# Patient Record
Sex: Female | Born: 1965 | Race: White | Hispanic: No | Smoking: Former smoker
Health system: Southern US, Community
[De-identification: ages and names within clinical notes are randomized; demographics above are authoritative.]

## PROBLEM LIST (undated history)

## (undated) DIAGNOSIS — J302 Other seasonal allergic rhinitis: Secondary | ICD-10-CM

## (undated) DIAGNOSIS — K219 Gastro-esophageal reflux disease without esophagitis: Secondary | ICD-10-CM

## (undated) DIAGNOSIS — G4730 Sleep apnea, unspecified: Secondary | ICD-10-CM

## (undated) DIAGNOSIS — F32A Depression, unspecified: Secondary | ICD-10-CM

## (undated) DIAGNOSIS — I4892 Unspecified atrial flutter: Secondary | ICD-10-CM

## (undated) DIAGNOSIS — T7840XA Allergy, unspecified, initial encounter: Secondary | ICD-10-CM

## (undated) DIAGNOSIS — I4891 Unspecified atrial fibrillation: Secondary | ICD-10-CM

## (undated) DIAGNOSIS — C189 Malignant neoplasm of colon, unspecified: Secondary | ICD-10-CM

## (undated) DIAGNOSIS — D500 Iron deficiency anemia secondary to blood loss (chronic): Secondary | ICD-10-CM

## (undated) DIAGNOSIS — F419 Anxiety disorder, unspecified: Secondary | ICD-10-CM

## (undated) DIAGNOSIS — Z86010 Personal history of colonic polyps: Secondary | ICD-10-CM

## (undated) HISTORY — DX: Sleep apnea, unspecified: G47.30

## (undated) HISTORY — DX: Depression, unspecified: F32.A

## (undated) HISTORY — DX: Anxiety disorder, unspecified: F41.9

## (undated) HISTORY — DX: Unspecified atrial fibrillation: I48.91

## (undated) HISTORY — DX: Unspecified atrial flutter: I48.92

## (undated) HISTORY — DX: Gastro-esophageal reflux disease without esophagitis: K21.9

## (undated) HISTORY — PX: WISDOM TOOTH EXTRACTION: SHX21

## (undated) HISTORY — DX: Iron deficiency anemia secondary to blood loss (chronic): D50.0

## (undated) HISTORY — DX: Personal history of colonic polyps: Z86.010

## (undated) HISTORY — DX: Malignant neoplasm of colon, unspecified: C18.9

## (undated) HISTORY — DX: Allergy, unspecified, initial encounter: T78.40XA

---

## 2002-06-24 ENCOUNTER — Other Ambulatory Visit: Admission: RE | Admit: 2002-06-24 | Discharge: 2002-06-24 | Payer: Self-pay | Admitting: Obstetrics and Gynecology

## 2003-06-28 ENCOUNTER — Other Ambulatory Visit: Admission: RE | Admit: 2003-06-28 | Discharge: 2003-06-28 | Payer: Self-pay | Admitting: Gynecology

## 2004-06-28 ENCOUNTER — Other Ambulatory Visit: Admission: RE | Admit: 2004-06-28 | Discharge: 2004-06-28 | Payer: Self-pay | Admitting: Gynecology

## 2004-10-23 ENCOUNTER — Other Ambulatory Visit: Admission: RE | Admit: 2004-10-23 | Discharge: 2004-10-23 | Payer: Self-pay | Admitting: Gynecology

## 2005-06-29 ENCOUNTER — Other Ambulatory Visit: Admission: RE | Admit: 2005-06-29 | Discharge: 2005-06-29 | Payer: Self-pay | Admitting: Gynecology

## 2005-10-04 ENCOUNTER — Other Ambulatory Visit: Admission: RE | Admit: 2005-10-04 | Discharge: 2005-10-04 | Payer: Self-pay | Admitting: Obstetrics and Gynecology

## 2006-07-22 ENCOUNTER — Other Ambulatory Visit: Admission: RE | Admit: 2006-07-22 | Discharge: 2006-07-22 | Payer: Self-pay | Admitting: Gynecology

## 2007-08-01 ENCOUNTER — Other Ambulatory Visit: Admission: RE | Admit: 2007-08-01 | Discharge: 2007-08-01 | Payer: Self-pay | Admitting: Gynecology

## 2008-02-12 ENCOUNTER — Other Ambulatory Visit: Admission: RE | Admit: 2008-02-12 | Discharge: 2008-02-12 | Payer: Self-pay | Admitting: Gynecology

## 2008-02-13 ENCOUNTER — Ambulatory Visit (HOSPITAL_BASED_OUTPATIENT_CLINIC_OR_DEPARTMENT_OTHER): Admission: RE | Admit: 2008-02-13 | Discharge: 2008-02-13 | Payer: Self-pay | Admitting: Otolaryngology

## 2008-09-15 ENCOUNTER — Other Ambulatory Visit: Admission: RE | Admit: 2008-09-15 | Discharge: 2008-09-15 | Payer: Self-pay | Admitting: Gynecology

## 2008-09-15 ENCOUNTER — Ambulatory Visit: Payer: Self-pay | Admitting: Women's Health

## 2008-09-15 ENCOUNTER — Encounter: Payer: Self-pay | Admitting: Women's Health

## 2009-04-11 ENCOUNTER — Ambulatory Visit: Payer: Self-pay | Admitting: Women's Health

## 2009-04-11 ENCOUNTER — Other Ambulatory Visit: Admission: RE | Admit: 2009-04-11 | Discharge: 2009-04-11 | Payer: Self-pay | Admitting: Gynecology

## 2009-04-11 ENCOUNTER — Encounter: Payer: Self-pay | Admitting: Women's Health

## 2009-05-17 ENCOUNTER — Ambulatory Visit: Payer: Self-pay | Admitting: Gynecology

## 2009-05-30 ENCOUNTER — Ambulatory Visit: Payer: Self-pay | Admitting: Gynecology

## 2010-04-20 ENCOUNTER — Ambulatory Visit: Payer: Self-pay | Admitting: Women's Health

## 2010-04-20 ENCOUNTER — Other Ambulatory Visit: Admission: RE | Admit: 2010-04-20 | Discharge: 2010-04-20 | Payer: Self-pay | Admitting: Gynecology

## 2011-04-25 ENCOUNTER — Encounter: Payer: Self-pay | Admitting: Women's Health

## 2011-04-26 ENCOUNTER — Encounter (INDEPENDENT_AMBULATORY_CARE_PROVIDER_SITE_OTHER): Payer: BC Managed Care – PPO | Admitting: Women's Health

## 2011-04-26 ENCOUNTER — Other Ambulatory Visit: Payer: Self-pay | Admitting: Women's Health

## 2011-04-26 ENCOUNTER — Other Ambulatory Visit (HOSPITAL_COMMUNITY)
Admission: RE | Admit: 2011-04-26 | Discharge: 2011-04-26 | Disposition: A | Discharge status: Home / Self Care | Payer: BC Managed Care – PPO | Source: Ambulatory Visit | Attending: Gynecology | Admitting: Gynecology

## 2011-04-26 DIAGNOSIS — Z01419 Encounter for gynecological examination (general) (routine) without abnormal findings: Secondary | ICD-10-CM

## 2011-04-26 DIAGNOSIS — Z124 Encounter for screening for malignant neoplasm of cervix: Secondary | ICD-10-CM | POA: Insufficient documentation

## 2011-05-01 NOTE — Procedures (Signed)
NAME:  JACQUELYNN, FRIEND        ACCOUNT NO.:  [id number]   MEDICAL RECORD NO.:  [id number]          PATIENT TYPE:  OUT   LOCATION:  SLEEP CENTER                 FACILITY:  [facility]   PHYSICIAN:  Clinton D. Maple Hudson, MD, FCCP, FACPDATE OF BIRTH:  10-26-1964   DATE OF STUDY:  02/13/2008                            NOCTURNAL POLYSOMNOGRAM   REFERRING PHYSICIAN:  Hermelinda Medicus, M.D.   INDICATION FOR STUDY:  Hypersomnia with sleep apnea.   EPWORTH SLEEPINESS SCORE:  10/24, BMI 34.4, weight 188 pounds, height 62  inches, neck 14.5 inches.   HOME MEDICATIONS:  None listed.   SLEEP ARCHITECTURE:  Total sleep time 373 minutes with sleep efficiency  94%.  Stage I was 2%, stage II was 83.9%, stage III absent, REM 14.1% of  total sleep time.  Sleep latency 9 minutes, REM latency 87 minutes.  Awake after sleep onset 15 minutes.  Arousal index 2.9.  No bedtime  medication was taken.   RESPIRATORY DATA:  NPSG protocol.  Apnea/hypopnea index (AHI) 3.1 per  hour which is within normal limits.  Nineteen events were recorded, all  hypopneas.  Events were not positional.   OXYGEN DATA:  Loud snoring with oxygen desaturation to a nadir of 90%,  mean oxygen saturation through the study was 95.5% on room air.   CARDIAC DATA:  Normal sinus rhythm.   MOVEMENT-PARASOMNIA:  No significant movement disturbance, no bathroom  trips.   IMPRESSIONS-RECOMMENDATIONS:  Occasional respiratory events within  normal limits.  Apnea/hypopnea index 3.1 per hour (normal range 0-5 per  hour).  Events were not positional.  Loud snoring with oxygen  desaturation to a nadir of 90%.      Clinton D. Maple Hudson, MD, [facility], FACP  Diplomate, Biomedical engineer of Sleep Medicine  Electronically Signed     CDY/MEDQ  D:  02/21/2008 15:45:54  T:  02/22/2008 12:11:05  Job:  [id number]

## 2012-04-16 HISTORY — PX: WRIST SURGERY: SHX841

## 2012-05-01 ENCOUNTER — Ambulatory Visit (INDEPENDENT_AMBULATORY_CARE_PROVIDER_SITE_OTHER): Payer: BC Managed Care – PPO | Admitting: Women's Health

## 2012-05-01 ENCOUNTER — Encounter: Payer: BC Managed Care – PPO | Admitting: Women's Health

## 2012-05-01 ENCOUNTER — Other Ambulatory Visit (HOSPITAL_COMMUNITY)
Admission: RE | Admit: 2012-05-01 | Discharge: 2012-05-01 | Disposition: A | Discharge status: Home / Self Care | Payer: BC Managed Care – PPO | Source: Ambulatory Visit | Attending: Obstetrics and Gynecology | Admitting: Obstetrics and Gynecology

## 2012-05-01 ENCOUNTER — Encounter: Payer: Self-pay | Admitting: Women's Health

## 2012-05-01 VITALS — BP 120/80 | Ht 62.5 in | Wt 198.0 lb

## 2012-05-01 DIAGNOSIS — Z13220 Encounter for screening for lipoid disorders: Secondary | ICD-10-CM

## 2012-05-01 DIAGNOSIS — Z01419 Encounter for gynecological examination (general) (routine) without abnormal findings: Secondary | ICD-10-CM

## 2012-05-01 DIAGNOSIS — Z833 Family history of diabetes mellitus: Secondary | ICD-10-CM

## 2012-05-01 DIAGNOSIS — E079 Disorder of thyroid, unspecified: Secondary | ICD-10-CM

## 2012-05-01 MED ORDER — FLUCONAZOLE 100 MG PO TABS: ORAL_TABLET | ORAL | Status: DC

## 2012-05-01 NOTE — Patient Instructions (Signed)

## 2012-05-01 NOTE — Progress Notes (Signed)
Savannah Cook [birth year] [id number]    History:    The patient presents for annual exam.  Monthly 4 day light cycle. Uses no contraception. History of ascus in 2005, 2008 with negative high-risk HPV,  with normal Paps after. History of normal mammograms, last mammogram 2011.   Past medical history, past surgical history, family history and social history were all reviewed and documented in the EPIC chart. 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 Daughter [name] 16, doing well, gardasil encouraged. Mother with many health issues and mental health problems living with her.   ROS:  A  ROS was performed and pertinent positives and negatives are included in the history.  Exam:  Filed Vitals:   05/01/12 0906  BP: 120/80    General appearance:  Normal Head/Neck:  Normal, without cervical or supraclavicular adenopathy. Thyroid:  Symmetrical, normal in size, without palpable masses or nodularity. Respiratory  Effort:  Normal  Auscultation:  Clear without wheezing or rhonchi Cardiovascular  Auscultation:  Regular rate, without rubs, murmurs or gallops  Edema/varicosities:  Not grossly evident Abdominal  Soft,nontender, without masses, guarding or rebound.  Liver/spleen:  No organomegaly noted  Hernia:  None appreciated  Skin  Inspection:  Grossly normal  Palpation:  Grossly normal Neurologic/psychiatric  Orientation:  Normal with appropriate conversation.  Mood/affect:  Normal  Genitourinary    Breasts: Examined lying and sitting.     Right: Without masses, retractions, discharge or axillary adenopathy.     Left: Without masses, retractions, discharge or axillary adenopathy.   Inguinal/mons:  Normal without inguinal adenopathy  External genitalia:  Normal  BUS/Urethra/Skene's glands:  Normal  Bladder:  Normal  Vagina:  Normal  Cervix:  Normal  Uterus:   normal in size, fibroids,  Midline and mobile  Adnexa/parametria:     Rt: Without masses or tenderness.   Lt: Without masses or  tenderness.  Anus and perineum: Normal  Digital rectal exam: Normal sphincter tone without palpated masses or tenderness  Assessment/Plan:  46 y.o. DWF G1P1  for annual exam with occasional knee pain and increased fatigue, stress/anxiety  Ascus with negative high-risk HPV in 2006, 2008 normal Paps after Light four-day monthly cycles Multiple small uterine fibroids. Situational stressors  Plan: SBE's, schedule annual mammogram, breast mammogram 2011. Situational stressors of mother with multiple health issues living with her, encourage counseling, setting limits, community home health assistants as needed. Declines need for medications. Condoms encouraged when sexually active, menopause reviewed,  CBC, glucose, TSH, lipid profile, UA and Pap.    Harrington Challenger [facility], 12:51 PM 05/01/2012

## 2012-05-02 LAB — URINALYSIS W MICROSCOPIC + REFLEX CULTURE
Bacteria, UA: NONE SEEN
Bilirubin Urine: NEGATIVE
Glucose, UA: NEGATIVE mg/dL
Ketones, ur: NEGATIVE mg/dL
Protein, ur: NEGATIVE mg/dL
Squamous Epithelial / LPF: NONE SEEN
Urobilinogen, UA: 0.2 mg/dL (ref 0.0–1.0)

## 2012-05-05 ENCOUNTER — Other Ambulatory Visit: Payer: Self-pay | Admitting: Orthopedic Surgery

## 2012-05-05 ENCOUNTER — Telehealth: Payer: Self-pay | Admitting: *Deleted

## 2012-05-05 NOTE — Telephone Encounter (Signed)
[phone] 

## 2012-05-05 NOTE — Telephone Encounter (Signed)
Pt would like you to call you at your convenience at [phone] she would like to update you information.

## 2012-05-05 NOTE — Telephone Encounter (Signed)
Cell number not available.

## 2012-05-06 ENCOUNTER — Encounter (HOSPITAL_BASED_OUTPATIENT_CLINIC_OR_DEPARTMENT_OTHER): Payer: Self-pay | Admitting: *Deleted

## 2012-05-06 NOTE — Progress Notes (Signed)
No labs needed To come with friend Lives with mom

## 2012-05-06 NOTE — Telephone Encounter (Signed)
Telephone call. States was on a camping  trip with her daughter and friend, fell off a log broke right radius and is having surgery on 5/23. Questions when she should have her labs drawn. Fainted at office visit with blood draw. Will have lipid panel, TSH, glucose, drawn while recovering from surgery, will be out of work 2 weeks. Reviewed she will have a CBC prior to her surgery. Instructed to call office for a lab appointment.

## 2012-05-07 ENCOUNTER — Encounter: Payer: BC Managed Care – PPO | Admitting: Women's Health

## 2012-05-08 ENCOUNTER — Encounter (HOSPITAL_BASED_OUTPATIENT_CLINIC_OR_DEPARTMENT_OTHER)
Admission: RE | Disposition: A | Discharge status: Home / Self Care | Payer: Self-pay | Source: Ambulatory Visit | Attending: Orthopedic Surgery

## 2012-05-08 ENCOUNTER — Ambulatory Visit (HOSPITAL_BASED_OUTPATIENT_CLINIC_OR_DEPARTMENT_OTHER)
Admission: RE | Admit: 2012-05-08 | Discharge: 2012-05-08 | Disposition: A | Discharge status: Home / Self Care | Payer: BC Managed Care – PPO | Source: Ambulatory Visit | Attending: Orthopedic Surgery | Admitting: Orthopedic Surgery

## 2012-05-08 ENCOUNTER — Encounter (HOSPITAL_BASED_OUTPATIENT_CLINIC_OR_DEPARTMENT_OTHER): Payer: Self-pay | Admitting: Anesthesiology

## 2012-05-08 ENCOUNTER — Encounter (HOSPITAL_BASED_OUTPATIENT_CLINIC_OR_DEPARTMENT_OTHER): Payer: Self-pay | Admitting: *Deleted

## 2012-05-08 ENCOUNTER — Ambulatory Visit (HOSPITAL_BASED_OUTPATIENT_CLINIC_OR_DEPARTMENT_OTHER): Payer: BC Managed Care – PPO | Admitting: Anesthesiology

## 2012-05-08 DIAGNOSIS — W010XXA Fall on same level from slipping, tripping and stumbling without subsequent striking against object, initial encounter: Secondary | ICD-10-CM | POA: Insufficient documentation

## 2012-05-08 DIAGNOSIS — Y92838 Other recreation area as the place of occurrence of the external cause: Secondary | ICD-10-CM | POA: Insufficient documentation

## 2012-05-08 DIAGNOSIS — Y9239 Other specified sports and athletic area as the place of occurrence of the external cause: Secondary | ICD-10-CM | POA: Insufficient documentation

## 2012-05-08 DIAGNOSIS — S52599A Other fractures of lower end of unspecified radius, initial encounter for closed fracture: Secondary | ICD-10-CM | POA: Insufficient documentation

## 2012-05-08 HISTORY — DX: Other seasonal allergic rhinitis: J30.2

## 2012-05-08 SURGERY — OPEN REDUCTION INTERNAL FIXATION (ORIF) DISTAL RADIUS FRACTURE
Anesthesia: General | Site: Wrist | Laterality: Right | Wound class: Clean

## 2012-05-08 MED ORDER — FENTANYL CITRATE 0.05 MG/ML IJ SOLN
50.0000 ug | INTRAMUSCULAR | Status: DC | PRN
  Administered 2012-05-08: 100 ug via INTRAVENOUS

## 2012-05-08 MED ORDER — FENTANYL CITRATE 0.05 MG/ML IJ SOLN: 25.0000 ug | INTRAMUSCULAR | Status: DC | PRN

## 2012-05-08 MED ORDER — DOXYCYCLINE HYCLATE 100 MG PO TABS: 100.0000 mg | ORAL_TABLET | Freq: Two times a day (BID) | ORAL | Status: AC

## 2012-05-08 MED ORDER — EPHEDRINE SULFATE 50 MG/ML IJ SOLN
INTRAMUSCULAR | Status: DC | PRN
  Administered 2012-05-08 (×2): 10 mg via INTRAVENOUS

## 2012-05-08 MED ORDER — LIDOCAINE HCL (CARDIAC) 20 MG/ML IV SOLN
INTRAVENOUS | Status: DC | PRN
  Administered 2012-05-08: 50 mg via INTRAVENOUS

## 2012-05-08 MED ORDER — ROPIVACAINE HCL 5 MG/ML IJ SOLN
INTRAMUSCULAR | Status: DC | PRN
  Administered 2012-05-08: 25 mL via EPIDURAL

## 2012-05-08 MED ORDER — MIDAZOLAM HCL 2 MG/2ML IJ SOLN
0.5000 mg | INTRAMUSCULAR | Status: DC | PRN
  Administered 2012-05-08: 2 mg via INTRAVENOUS

## 2012-05-08 MED ORDER — PROPOFOL 10 MG/ML IV EMUL
INTRAVENOUS | Status: DC | PRN
  Administered 2012-05-08: 200 mg via INTRAVENOUS

## 2012-05-08 MED ORDER — DEXAMETHASONE SODIUM PHOSPHATE 4 MG/ML IJ SOLN
INTRAMUSCULAR | Status: DC | PRN
  Administered 2012-05-08: 10 mg via INTRAVENOUS

## 2012-05-08 MED ORDER — ONDANSETRON HCL 4 MG/2ML IJ SOLN
INTRAMUSCULAR | Status: DC | PRN
  Administered 2012-05-08: 4 mg via INTRAVENOUS

## 2012-05-08 MED ORDER — MIDAZOLAM HCL 5 MG/5ML IJ SOLN
INTRAMUSCULAR | Status: DC | PRN
  Administered 2012-05-08: 1 mg via INTRAVENOUS

## 2012-05-08 MED ORDER — VANCOMYCIN HCL IN DEXTROSE 1-5 GM/200ML-% IV SOLN
1000.0000 mg | INTRAVENOUS | Status: AC
  Administered 2012-05-08: 1000 mg via INTRAVENOUS

## 2012-05-08 MED ORDER — METOCLOPRAMIDE HCL 5 MG/ML IJ SOLN: 10.0000 mg | Freq: Once | INTRAMUSCULAR | Status: DC | PRN

## 2012-05-08 MED ORDER — LACTATED RINGERS IV SOLN
INTRAVENOUS | Status: DC
  Administered 2012-05-08 (×2): via INTRAVENOUS

## 2012-05-08 MED ORDER — LIDOCAINE HCL 1 % IJ SOLN
INTRAMUSCULAR | Status: DC | PRN
  Administered 2012-05-08: 2 mL via INTRADERMAL

## 2012-05-08 MED ORDER — CHLORHEXIDINE GLUCONATE 4 % EX LIQD: 60.0000 mL | Freq: Once | CUTANEOUS | Status: DC

## 2012-05-08 SURGICAL SUPPLY — 67 items
BAG DECANTER FOR FLEXI CONT (MISCELLANEOUS) IMPLANT
BANDAGE ADHESIVE 1X3 (GAUZE/BANDAGES/DRESSINGS) IMPLANT
BANDAGE ELASTIC 3 VELCRO ST LF (GAUZE/BANDAGES/DRESSINGS) ×4 IMPLANT
BANDAGE GAUZE ELAST BULKY 4 IN (GAUZE/BANDAGES/DRESSINGS) ×2 IMPLANT
BIT DRILL 2 FAST STEP (BIT) ×2 IMPLANT
BIT DRILL 2.5X4 QC (BIT) ×2 IMPLANT
BLADE MINI RND TIP GREEN BEAV (BLADE) ×2 IMPLANT
BLADE SURG 15 STRL LF DISP TIS (BLADE) ×2 IMPLANT
BLADE SURG 15 STRL SS (BLADE) ×2
BNDG ESMARK 4X9 LF (GAUZE/BANDAGES/DRESSINGS) ×2 IMPLANT
BRUSH SCRUB EZ PLAIN DRY (MISCELLANEOUS) ×2 IMPLANT
CANISTER SUCTION 1200CC (MISCELLANEOUS) ×2 IMPLANT
CLOTH BEACON ORANGE TIMEOUT ST (SAFETY) ×2 IMPLANT
CORDS BIPOLAR (ELECTRODE) ×2 IMPLANT
COVER MAYO STAND STRL (DRAPES) ×2 IMPLANT
COVER TABLE BACK 60X90 (DRAPES) ×2 IMPLANT
CUFF TOURNIQUET SINGLE 18IN (TOURNIQUET CUFF) ×2 IMPLANT
DECANTER SPIKE VIAL GLASS SM (MISCELLANEOUS) IMPLANT
DRAPE EXTREMITY T 121X128X90 (DRAPE) ×2 IMPLANT
DRAPE OEC MINIVIEW 54X84 (DRAPES) ×2 IMPLANT
DRAPE SURG 17X23 STRL (DRAPES) ×2 IMPLANT
GAUZE XEROFORM 1X8 LF (GAUZE/BANDAGES/DRESSINGS) IMPLANT
GLOVE BIO SURGEON STRL SZ 6.5 (GLOVE) ×4 IMPLANT
GLOVE BIOGEL M STRL SZ7.5 (GLOVE) ×4 IMPLANT
GLOVE BIOGEL PI IND STRL 7.0 (GLOVE) ×1 IMPLANT
GLOVE BIOGEL PI INDICATOR 7.0 (GLOVE) ×1
GLOVE ORTHO TXT STRL SZ7.5 (GLOVE) ×2 IMPLANT
GOWN PREVENTION PLUS XLARGE (GOWN DISPOSABLE) IMPLANT
GOWN PREVENTION PLUS XXLARGE (GOWN DISPOSABLE) IMPLANT
KWIRE 4.0 X .062IN (WIRE) ×2 IMPLANT
LOOP VESSEL MAXI BLUE (MISCELLANEOUS) IMPLANT
NEEDLE 27GAX1X1/2 (NEEDLE) IMPLANT
NS IRRIG 1000ML POUR BTL (IV SOLUTION) ×2 IMPLANT
PACK BASIN DAY SURGERY FS (CUSTOM PROCEDURE TRAY) ×2 IMPLANT
PAD CAST 3X4 CTTN HI CHSV (CAST SUPPLIES) ×2 IMPLANT
PADDING CAST ABS 4INX4YD NS (CAST SUPPLIES) ×1
PADDING CAST ABS COTTON 4X4 ST (CAST SUPPLIES) ×1 IMPLANT
PADDING CAST COTTON 3X4 STRL (CAST SUPPLIES) ×2
PEG SUBCHONDRAL SMOOTH 2.0X16 (Peg) ×4 IMPLANT
PEG SUBCHONDRAL SMOOTH 2.0X18 (Peg) ×8 IMPLANT
PLATE SHORT 24.4X51.3 RT (Plate) ×2 IMPLANT
SCREW BN 12X3.5XNS CORT TI (Screw) ×2 IMPLANT
SCREW CORT 3.5X10 LNG (Screw) ×2 IMPLANT
SCREW CORT 3.5X12 (Screw) ×2 IMPLANT
SCREW PEG LOCK 2.5X16 (Peg) ×2 IMPLANT
SLEEVE SCD COMPRESS KNEE MED (MISCELLANEOUS) IMPLANT
SPLINT PLASTER CAST XFAST 3X15 (CAST SUPPLIES) ×10 IMPLANT
SPLINT PLASTER XTRA FASTSET 3X (CAST SUPPLIES) ×10
SPONGE GAUZE 4X4 12PLY (GAUZE/BANDAGES/DRESSINGS) IMPLANT
STOCKINETTE 4X48 STRL (DRAPES) ×2 IMPLANT
STRIP CLOSURE SKIN 1/2X4 (GAUZE/BANDAGES/DRESSINGS) ×2 IMPLANT
SUCTION FRAZIER TIP 10 FR DISP (SUCTIONS) IMPLANT
SUT ETHIBOND 3-0 V-5 (SUTURE) IMPLANT
SUT PROLENE 3 0 PS 2 (SUTURE) ×2 IMPLANT
SUT VIC AB 0 SH 27 (SUTURE) ×2 IMPLANT
SUT VIC AB 2-0 PS2 27 (SUTURE) ×2 IMPLANT
SUT VIC AB 3-0 FS2 27 (SUTURE) ×2 IMPLANT
SUT VIC AB 4-0 BRD 54 (SUTURE) IMPLANT
SUT VICRYL 4-0 PS2 18IN ABS (SUTURE) ×2 IMPLANT
SYR 3ML 23GX1 SAFETY (SYRINGE) IMPLANT
SYR BULB 3OZ (MISCELLANEOUS) ×2 IMPLANT
SYR CONTROL 10ML LL (SYRINGE) IMPLANT
TOWEL OR 17X24 6PK STRL BLUE (TOWEL DISPOSABLE) ×2 IMPLANT
TRAY DSU PREP LF (CUSTOM PROCEDURE TRAY) ×2 IMPLANT
TUBE CONNECTING 20X1/4 (TUBING) ×2 IMPLANT
UNDERPAD 30X30 INCONTINENT (UNDERPADS AND DIAPERS) ×2 IMPLANT
WATER STERILE IRR 1000ML POUR (IV SOLUTION) IMPLANT

## 2012-05-08 NOTE — Op Note (Signed)
Note dictated [id number]

## 2012-05-08 NOTE — H&P (Addendum)
Grossly normalGrossly normalGrossly normalDiane LORENZA Cook is an 46 y.o. female.   Chief Complaint: c/o injury to her right wrist after a fall. HPI: Savannah Cook is referred by the [facility] in [place], [place] for follow up of a comminuted intraarticular fracture of the right distal radius sustained when she fell at [place] on 05-02-12.   At that time she was camping with her children. She accidentally tripped over a log falling near a fire pit. She sustained a significant injury to her right wrist with marked swelling, pain and deformity. She was seen at the [facility] in [place] where digital images of her wrist revealed a comminuted intraarticular fracture of the right distal radius. She was evaluated by Dr. Kae Heller, ER physician who under Propofol anesthesia performed a closed reduction of her wrist and applied a sugar tong splint.   X-rays in the splint revealed near anatomic reduction, however careful inspection of the films and specifically lateral film reveals that her volar cortex is not locked and the dorsal cortex is comminuted. There is a 100% certainty her wrist fracture will displace.   Past Medical History  Diagnosis Date  . Anxiety   . Seasonal allergies     No past surgical history on file.  Family History  Problem Relation Age of Onset  . Diabetes Mother   . Hypertension Mother   . Bipolar disorder Mother   . Diabetes Father   . Hypertension Father   . Heart disease Father   . Cancer Father     LUNG  . Cancer Maternal Grandfather     COLON   Social History:  reports that she has never smoked. She has never used smokeless tobacco. She reports that she drinks alcohol. She reports that she does not use illicit drugs.  Allergies:  Allergies  Allergen Reactions  . Ceclor (Cefaclor)   . Other     SEASONAL    No prescriptions prior to admission    No results found for this or any previous visit (from the past 48 hour(s)).  No results  found.   Pertinent items are noted in HPI.  Last menstrual period 05/05/2012.  General appearance: alert Head: Normocephalic, without obvious abnormality Neck: supple, symmetrical, trachea midline Resp: clear to auscultation bilaterally Cardio: regular rate and rhythm GI: normal findings: bowel sounds normal Extremities: . She has 3-4+ swelling of her fingers in the sugar tong splint. Her sensibility is mildly impaired due to swelling. She is able to flex her thumb and fingers. Her shoulder is uninjured. She has mild discomfort in her left wrist but states she did not sustain a significant injury to the left hand.   The films from [facility] in [place] are reviewed. She has a comminuted intraarticular fracture through the lunate facet and through the distal radial ulnar joint.   Her post reduction films revealed near anatomic reduction on the PA view, however on the lateral her volar cortex is not locked and the dorsal cortex is comminuted. We know from experience the chance of displacement is very substantial with loss of length, slope and ventral tilt.  Pulses:2+ Skin:normal Neurologic: WNL    Assessment/Plan Assessment: Comminuted intraarticular fracture right wrist.  Plan: I have advised Lihanna to be placed in a new sugar tong splint that is much less tight. This will help her swelling. She will work on ROM exercises. We will schedule her for elective ORIF of her fracture with application of volar plate on 1-61-09 at [facility]. The surgery, after care, risks and benefits were described in detail.     DASNOIT,Pharrell Ledford J 05/08/2012, 10:31 AM  Wyn Forster., MD     History and physical updated.  No change in plan. Savannah Cook is much more comfortable in her revised splint.  Surgery and aftercare described in detail.

## 2012-05-08 NOTE — Transfer of Care (Signed)
Immediate Anesthesia Transfer of Care Note  Patient: Savannah Cook  Procedure(s) Performed: Procedure(s) (LRB): OPEN REDUCTION INTERNAL FIXATION (ORIF) DISTAL RADIAL FRACTURE (Right)  Patient Location: PACU  Anesthesia Type: GA combined with regional for post-op pain  Level of Consciousness: awake, alert  and oriented  Airway & Oxygen Therapy: Patient Spontanous Breathing and Patient connected to face mask oxygen  Post-op Assessment: Report given to PACU RN and Post -op Vital signs reviewed and stable  Post vital signs: Reviewed and stable  Complications: No apparent anesthesia complications

## 2012-05-08 NOTE — Brief Op Note (Signed)
05/08/2012  3:52 PM  PATIENT:  Savannah Cook  46 y.o. female  PRE-OPERATIVE DIAGNOSIS:  displaced right distal radius fracture comminuted articular  POST-OPERATIVE DIAGNOSIS:  displaced articular fracture of right distal radius  PROCEDURE: OPEN REDUCTION INTERNAL FIXATION (ORIF) DISTAL RADIAL FRACTURE RIGHT WRIST WITH DVR SHORT SEVEN PEG PLATE  SURGEON:  Wyn Forster., MD   PHYSICIAN ASSISTANT:   ASSISTANTS: Mallory Shirk.A-C    ANESTHESIA:   general  EBL:  Total I/O In: 1300 [I.V.:1300] Out: -   BLOOD ADMINISTERED:none  DRAINS: none   LOCAL MEDICATIONS USED:  ropivicaine plexus block pre operatively  SPECIMEN:  No Specimen  DISPOSITION OF SPECIMEN:  N/A  COUNTS:  YES  TOURNIQUET:  * Missing tourniquet times found for documented tourniquets in log:  40470 *  DICTATION: .Other Dictation: Dictation Number [id number]  PLAN OF CARE: Discharge to home after PACU  PATIENT DISPOSITION:  PACU - hemodynamically stable.

## 2012-05-08 NOTE — Anesthesia Postprocedure Evaluation (Signed)
Anesthesia Post Note  Patient: Savannah Cook  Procedure(s) Performed: Procedure(s) (LRB): OPEN REDUCTION INTERNAL FIXATION (ORIF) DISTAL RADIAL FRACTURE (Right)  Anesthesia type: General  Patient location: PACU  Post pain: Pain level controlled  Post assessment: Patient's Cardiovascular Status Stable  Last Vitals:  Filed Vitals:   05/08/12 1615  BP: 133/75  Pulse: 83  Temp:   Resp: 21    Post vital signs: Reviewed and stable  Level of consciousness: alert  Complications: No apparent anesthesia complications

## 2012-05-08 NOTE — Progress Notes (Signed)
Assisted Dr. Frederick with right, ultrasound guided, supraclavicular block. Side rails up, monitors on throughout procedure. See vital signs in flow sheet. Tolerated Procedure well. 

## 2012-05-08 NOTE — Anesthesia Procedure Notes (Addendum)
Anesthesia Regional Block:  Supraclavicular block  Pre-Anesthetic Checklist: ,, timeout performed, Correct Patient, Correct Site, Correct Laterality, Correct Procedure, Correct Position, site marked, Risks and benefits discussed,  Surgical consent,  Pre-op evaluation,  At surgeon's request and post-op pain management  Laterality: Right  Prep: chloraprep       Needles:   Needle Type: Other   (Arrow Echogenic)   Needle Length: 9cm  Needle Gauge: 21    Additional Needles:  Procedures: ultrasound guided Supraclavicular block Narrative:  Start time: 05/08/2012 1:15 PM End time: 05/08/2012 1:23 PM Injection made incrementally with aspirations every 5 mL.  Performed by: Personally  Anesthesiologist: C Frederick  Additional Notes: Ultrasound guidance used to: id relevant anatomy, confirm needle position, local anesthetic spread, avoidance of vascular puncture. Picture saved. No complications. Block performed personally by Janetta Hora. Gelene Mink, MD    Supraclavicular block Procedure Name: LMA Insertion Date/Time: 05/08/2012 2:58 PM Performed by: Caren Macadam Pre-anesthesia Checklist: Patient identified, Emergency Drugs available, Suction available and Patient being monitored Patient Re-evaluated:Patient Re-evaluated prior to inductionOxygen Delivery Method: Circle System Utilized Preoxygenation: Pre-oxygenation with 100% oxygen Intubation Type: IV induction Ventilation: Mask ventilation without difficulty LMA: LMA with gastric port inserted LMA Size: 4.0 Number of attempts: 1 Airway Equipment and Method: bite block Placement Confirmation: positive ETCO2 and breath sounds checked- equal and bilateral Tube secured with: Tape Dental Injury: Teeth and Oropharynx as per pre-operative assessment

## 2012-05-08 NOTE — Discharge Instructions (Signed)
Hand Center Instructions °Hand Surgery ° °Wound Care: °Keep your hand elevated above the level of your heart.  Do not allow it to dangle by your side.  Keep the dressing dry and do not remove it unless your doctor advises you to do so.  He will usually change it at the time of your post-op visit.  Moving your fingers is advised to stimulate circulation but will depend on the site of your surgery.  If you have a splint applied, your doctor will advise you regarding movement. ° °Activity: °Do not drive or operate machinery today.  Rest today and then you may return to your normal activity and work as indicated by your physician. ° °Diet:  °Drink liquids today or eat a light diet.  You may resume a regular diet tomorrow.   ° °General expectations: °Pain for two to three days. °Fingers may become slightly swollen. ° °Call your doctor if any of the following occur: °Severe pain not relieved by pain medication. °Elevated temperature. °Dressing soaked with blood. °Inability to move fingers. °White or bluish color to fingers. ° ° °Post Anesthesia Home Care Instructions ° °Activity: °Get plenty of rest for the remainder of the day. A responsible adult should stay with you for 24 hours following the procedure.  °For the next 24 hours, DO NOT: °-Drive a car °-Operate machinery °-Drink alcoholic beverages °-Take any medication unless instructed by your physician °-Make any legal decisions or sign important papers. ° °Meals: °Start with liquid foods such as gelatin or soup. Progress to regular foods as tolerated. Avoid greasy, spicy, heavy foods. If nausea and/or vomiting occur, drink only clear liquids until the nausea and/or vomiting subsides. Call your physician if vomiting continues. ° °Special Instructions/Symptoms: °Your throat may feel dry or sore from the anesthesia or the breathing tube placed in your throat during surgery. If this causes discomfort, gargle with warm salt water. The discomfort should disappear within 24  hours. ° ° °Regional Anesthesia Blocks ° °1. Numbness or the inability to move the "blocked" extremity may last from 3-48 hours after placement. The length of time depends on the medication injected and your individual response to the medication. If the numbness is not going away after 48 hours, call your surgeon. ° °2. The extremity that is blocked will need to be protected until the numbness is gone and the  Strength has returned. Because you cannot feel it, you will need to take extra care to avoid injury. Because it may be weak, you may have difficulty moving it or using it. You may not know what position it is in without looking at it while the block is in effect. ° °3. For blocks in the legs and feet, returning to weight bearing and walking needs to be done carefully. You will need to wait until the numbness is entirely gone and the strength has returned. You should be able to move your leg and foot normally before you try and bear weight or walk. You will need someone to be with you when you first try to ensure you do not fall and possibly risk injury. ° °4. Bruising and tenderness at the needle site are common side effects and will resolve in a few days. ° °5. Persistent numbness or new problems with movement should be communicated to the surgeon or the [facility] ([phone])/ [facility] ([phone]). °

## 2012-05-08 NOTE — Anesthesia Preprocedure Evaluation (Signed)
Anesthesia Evaluation  Patient identified by MRN, date of birth, ID band Patient awake    Reviewed: Allergy & Precautions, H&P , NPO status , Patient's Chart, lab work & pertinent test results, reviewed documented beta blocker date and time   Airway Mallampati: II TM Distance: >3 FB Neck ROM: full    Dental   Pulmonary neg pulmonary ROS,          Cardiovascular negative cardio ROS      Neuro/Psych negative neurological ROS  negative psych ROS   GI/Hepatic negative GI ROS, Neg liver ROS,   Endo/Other  Morbid obesity  Renal/GU negative Renal ROS  negative genitourinary   Musculoskeletal   Abdominal   Peds  Hematology negative hematology ROS (+)   Anesthesia Other Findings See surgeon's H&P   Reproductive/Obstetrics negative OB ROS                           Anesthesia Physical Anesthesia Plan  ASA: III  Anesthesia Plan: General   Post-op Pain Management:    Induction: Intravenous  Airway Management Planned: LMA  Additional Equipment:   Intra-op Plan:   Post-operative Plan: Extubation in OR  Informed Consent: I have reviewed the patients History and Physical, chart, labs and discussed the procedure including the risks, benefits and alternatives for the proposed anesthesia with the patient or authorized representative who has indicated his/her understanding and acceptance.   Dental Advisory Given  Plan Discussed with: CRNA and Surgeon  Anesthesia Plan Comments:         Anesthesia Quick Evaluation  

## 2012-05-09 NOTE — Op Note (Signed)
[name], [name]                ACCOUNT NO.:  [id number]  MEDICAL RECORD NO.:  [id number]  LOCATION:                                 FACILITY:  PHYSICIAN:  Katy Fitch. Delyle Weider, M.D.      DATE OF BIRTH:  DATE OF PROCEDURE:  05/08/2012 DATE OF DISCHARGE:                              OPERATIVE REPORT   PREOPERATIVE DIAGNOSIS:  Comminuted displaced impacted articular fracture of right distal radius sustained on May 02, 2012.  POSTOPERATIVE DIAGNOSIS:  Comminuted impacted articular distal radius fracture, right.  OPERATION:  Open reduction and internal fixation with application of a short DVR 7 pegged plate obtaining an anatomic reduction of the articular surface and restoration of the length of the right distal radius.  OPERATING SURGEON:  Katy Fitch. Jerzy Crotteau, MD  ASSISTANT:  Marveen Reeks Dasnoit, PA-C  ANESTHESIA:  General by LMA supplemented by a right ropivacaine plexus block.  SUPERVISING ANESTHESIOLOGIST:  Janetta Hora. Gelene Mink, MD.  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  In the evening, she accidentally tripped over a log while approaching a fire place that landed very hard onto her outstretched right hand.  She had immediate pain and deformity of her right wrist.  She was seen at the [facility] in [place], [place] where x-rays revealed a comminuted impacted articular fracture.  She had a closed reduction performed by the attending emergency room physician and was placed in a very tight sugar tong splint.  She was advised to seek a Hand Surgery followup in [place].  A number of her acquaintances are familiar with our practice.  She was seen early on the morning of May 05, 2012, for consult and was noted to have the aforementioned comminuted fracture.  We advised her at the time due to marked swelling of her fingers and discomfort in the splint to have her emergency  room splint removed and a new splint applied for comfort.  We advised her as to the nature of her impacted articular fracture and explained that there would be a low probability of maintaining the reduction obtained.  We recommended application of a volar plate system.  After informed consent, she is brought to the operating room at this time.  Preoperatively, she was reminded the potential risks and benefits of surgery.  She was noted to have a volar abrasion of the wrist.  This does slightly increase her risk of postoperative infection.  Due to a Ceclor allergy, she will be covered with vancomycin in the perioperative period and will go home with a prescription for doxycycline.  The surgery and aftercare described in detail.  Questions were invited and answered.  PROCEDURE:  Dorthey Depace was brought to room #1 of the [facility] and placed in supine position on the operating table.  Following an anesthesia, informed consent by Dr. Gelene Mink, a right ropivacaine plexus block was placed with ultrasound control without complication.  This led the excellent anesthesia of the right upper extremity. In room #1 under Dr. Thornton Dales direct supervision, general anesthesia by LMA technique was induced followed all by positioning  of the right arm on an arm table and a pneumatic tourniquet on the proximal brachium. 1 g of vancomycin was administered as an IV prophylactic antibiotic.  Following routine Betadine scrub and paint of the right upper extremity, sterile drapes including stockinette and impervious arthroscopy drapes were applied.  Following routine surgical time-out, the right arm was exsanguinated with an Esmarch bandage and the arterial tourniquet was inflated to 220 mmHg.  Procedure commenced with planning of a standard DVR volar incision.  The incision was taken sharply with scalpel and subcutaneous tissues divided with scissors.  The radial superficial sensory  branch was identified and protected.  The flexor carpi radialis was identified and its fascia released.  We elevated the flexor pollicis longus and identified the pronator quadratus.  The fracture was below the pronator quadratus.  Interestingly, there was a partial rupture of one of the muscle bellies of the profundus tendon to the ring finger noted.  There was also some tearing of the pronator quadratus due to the violence of the initial injury.  The wound was carefully inspected and the pronator quadratus elevated off the fracture site.  The fracture was manipulated under direct vision with 3-point molding.  Anatomic reduction of the volar cortex was achieved followed by selection of a short DVR plate with 7 pegs.  We carefully positioned the plate symmetrically on the volar aspect of the radius and applied a threaded pin into the radial styloid and 6 subsequent smooth pins securing the articular surface of the radius to an anatomic position and securing the lunate facet independently with 2 pegs.  An anatomic reduction was achieved confirmed by AP and lateral images.  The length of the pegs was carefully controlled by use of the measuring stick, taking care to be sure that all of the pegs were subcortical.  Following thorough irrigation of the wound, the pronator quadratus was partially repaired over the distal aspect of the plate with mattress suture of 0 Vicryl.  The flexor pollicis longus was allowed to return to an anatomic position and the skin was closed with subcutaneous suture of 3-0 Vicryl and intradermal 3-0 Prolene segmental sutures.  Steri-Strips were applied followed by application of voluminous hand dressing and a sugar-tong splint maintaining the forearm in a neutral position.  There were no apparent complications.  For aftercare, Ms. Chargois is provided prescriptions for Percocet 5/325 1 p.o. q.4-6 hours p.r.n. pain, 30 tablets without refill, also she  is provided doxycycline 100 mg p.o. b.i.d. x4 days as a prophylactic antibiotic.     Katy Fitch Mahnoor Mathisen, M.D.     RVS/MEDQ  D:  05/08/2012  T:  05/08/2012  Job:  [id number]

## 2012-05-19 ENCOUNTER — Telehealth: Payer: Self-pay | Admitting: *Deleted

## 2012-05-19 NOTE — Telephone Encounter (Signed)
Telephone call, states doing better from fractured arm from fall and back to work 1/2 days. Has scheduled mammogram and is scheduled for lab on 6/6.

## 2012-05-19 NOTE — Telephone Encounter (Signed)
Pt called on Friday requesting to speak with you personally to update you some information. [phone] cell

## 2012-05-19 NOTE — Telephone Encounter (Signed)
Please see below note

## 2012-05-21 ENCOUNTER — Other Ambulatory Visit: Payer: BC Managed Care – PPO

## 2012-05-22 ENCOUNTER — Other Ambulatory Visit: Payer: BC Managed Care – PPO

## 2012-05-22 LAB — CBC WITH DIFFERENTIAL/PLATELET
Basophils Relative: 0 % (ref 0–1)
HCT: 42.7 % (ref 36.0–46.0)
Hemoglobin: 14.3 g/dL (ref 12.0–15.0)
Lymphocytes Relative: 31 % (ref 12–46)
Lymphs Abs: 2.2 10*3/uL (ref 0.7–4.0)
MCHC: 33.5 g/dL (ref 30.0–36.0)
Monocytes Absolute: 0.5 10*3/uL (ref 0.1–1.0)
Monocytes Relative: 7 % (ref 3–12)
Neutro Abs: 4 10*3/uL (ref 1.7–7.7)
Neutrophils Relative %: 58 % (ref 43–77)
RBC: 4.82 MIL/uL (ref 3.87–5.11)
WBC: 7 10*3/uL (ref 4.0–10.5)

## 2012-05-22 LAB — LIPID PANEL
Cholesterol: 225 mg/dL — ABNORMAL HIGH (ref 0–200)
HDL: 44 mg/dL (ref >39)
Total CHOL/HDL Ratio: 5.1 Ratio
Triglycerides: 127 mg/dL (ref <150)
VLDL: 25 mg/dL (ref 0–40)

## 2012-05-22 LAB — GLUCOSE, RANDOM: Glucose, Bld: 94 mg/dL (ref 70–99)

## 2012-05-26 ENCOUNTER — Encounter: Payer: Self-pay | Admitting: Women's Health

## 2012-06-30 ENCOUNTER — Emergency Department (HOSPITAL_BASED_OUTPATIENT_CLINIC_OR_DEPARTMENT_OTHER)
Admission: EM | Admit: 2012-06-30 | Discharge: 2012-06-30 | Disposition: A | Discharge status: Home / Self Care | Payer: BC Managed Care – PPO | Attending: Emergency Medicine | Admitting: Emergency Medicine

## 2012-06-30 ENCOUNTER — Encounter (HOSPITAL_BASED_OUTPATIENT_CLINIC_OR_DEPARTMENT_OTHER): Payer: Self-pay | Admitting: *Deleted

## 2012-06-30 DIAGNOSIS — L509 Urticaria, unspecified: Secondary | ICD-10-CM | POA: Insufficient documentation

## 2012-06-30 MED ORDER — DIPHENHYDRAMINE HCL 50 MG/ML IJ SOLN
50.0000 mg | Freq: Once | INTRAMUSCULAR | Status: AC
  Administered 2012-06-30: 50 mg via INTRAVENOUS
  Filled 2012-06-30: qty 1

## 2012-06-30 MED ORDER — FAMOTIDINE 20 MG PO TABS: ORAL_TABLET | ORAL | Status: DC

## 2012-06-30 MED ORDER — SODIUM CHLORIDE 0.9 % IV SOLN: Freq: Once | INTRAVENOUS | Status: DC

## 2012-06-30 MED ORDER — HYDROXYZINE HCL 25 MG PO TABS: 25.0000 mg | ORAL_TABLET | Freq: Four times a day (QID) | ORAL | Status: AC | PRN

## 2012-06-30 MED ORDER — FAMOTIDINE IN NACL 20-0.9 MG/50ML-% IV SOLN
20.0000 mg | Freq: Once | INTRAVENOUS | Status: AC
  Administered 2012-06-30: 20 mg via INTRAVENOUS
  Filled 2012-06-30: qty 50

## 2012-06-30 NOTE — ED Notes (Signed)
MD at bedside. 

## 2012-06-30 NOTE — ED Provider Notes (Signed)
History     CSN: [id number]  Arrival date & time 06/30/12  0001   None     Chief Complaint  Patient presents with  . Hives     (Consider location/radiation/quality/duration/timing/severity/associated sxs/prior treatment) HPI This is a 46 year old white female who had the sudden onset of hives beginning about 3 PM yesterday afternoon. They subsequently generalized and became pruritic. The symptoms were moderate to severe. She took 25 mg of Benadryl about 5 PM. She was seen in urgent care and was given 80 mg of IM this Depo-Medrol. She took another 50 mg of Benadryl about 10 PM and took a warm bath. She states the warm bath exacerbated the symptoms but the Benadryl did get him to some partial relief. She still continues to have generalized hives and itching. She denies any facial or throat swelling, dyspnea, nausea, vomiting or diarrhea. She's not sure what may have triggered this although she did use a product called Skin So Soft spray this morning about 9 AM.  Past Medical History  Diagnosis Date  . Anxiety   . Seasonal allergies     History reviewed. No pertinent past surgical history.  Family History  Problem Relation Age of Onset  . Diabetes Mother   . Hypertension Mother   . Bipolar disorder Mother   . Diabetes Father   . Hypertension Father   . Heart disease Father   . Cancer Father     LUNG  . Cancer Maternal Grandfather     COLON    History  Substance Use Topics  . Smoking status: Never Smoker   . Smokeless tobacco: Never Used  . Alcohol Use: Yes     RARELY    OB History    Grav Para Term Preterm Abortions TAB SAB Ect Mult Living   1 1              Review of Systems  All other systems reviewed and are negative.    Allergies  Ceclor and Other  Home Medications   Current Outpatient Rx  Name Route Sig Dispense Refill  . FEXOFENADINE HCL 180 MG PO TABS Oral Take 180 mg by mouth daily.    Marland Kitchen FLUCONAZOLE 100 MG PO TABS  Take one prn for itching 15  tablet 0  . FLUTICASONE FUROATE 27.5 MCG/SPRAY NA SUSP Nasal Place 2 sprays into the nose daily.    . IBUPROFEN 800 MG PO TABS Oral Take 800 mg by mouth every 8 (eight) hours as needed.      BP 130/83  Pulse 103  Temp 98.5 F (36.9 C) (Oral)  Resp 20  SpO2 100%  LMP 06/05/2012  Physical Exam General: Well-developed, well-nourished female in no acute distress; appearance consistent with age of record HENT: normocephalic, atraumatic; no pharyngeal or lingual edema Eyes: pupils equal round and reactive to light; extraocular muscles intact Neck: supple Heart: regular rate and rhythm Lungs: clear to auscultation bilaterally Abdomen: soft; nondistended Extremities: No deformity; full range of motion Neurologic: Awake, alert and oriented; motor function intact in all extremities and symmetric; no facial droop Skin: Generalized urticaria with sparing of the lower legs Psychiatric: Normal mood and affect    ED Course  Procedures (including critical care time)    MDM  3:01 AM Hives fading after IV Benadryl and Pepcid.        Hanley Seamen, MD 06/30/12 [phone]

## 2012-06-30 NOTE — ED Notes (Signed)
Pt began having hives this Pm around 3 pm states that she used skin so soft this am was seen at PCP and was given a steroid shot and told to take a hot bath when she got home pt reports after getting out of bath hives became worse denies difficulty breathing or swallowing

## 2013-05-06 ENCOUNTER — Telehealth: Payer: Self-pay | Admitting: *Deleted

## 2013-05-06 NOTE — Telephone Encounter (Signed)
Telephone call, questions answered concerning labs

## 2013-05-06 NOTE — Telephone Encounter (Signed)
Pt has annual scheduled on 05/14/13 @ 9:00 am and would like to have labs drawn earlier that morning prior to annual visit. She would like to speak with about having hemoglobin A1C drawn. With additional questions about labs. Call back # [phone]

## 2013-05-06 NOTE — Telephone Encounter (Signed)
Message left

## 2013-05-14 ENCOUNTER — Other Ambulatory Visit (HOSPITAL_COMMUNITY)
Admission: RE | Admit: 2013-05-14 | Discharge: 2013-05-14 | Disposition: A | Discharge status: Home / Self Care | Payer: BC Managed Care – PPO | Source: Ambulatory Visit | Attending: Gynecology | Admitting: Gynecology

## 2013-05-14 ENCOUNTER — Encounter: Payer: Self-pay | Admitting: Women's Health

## 2013-05-14 ENCOUNTER — Ambulatory Visit (INDEPENDENT_AMBULATORY_CARE_PROVIDER_SITE_OTHER): Payer: BC Managed Care – PPO | Admitting: Women's Health

## 2013-05-14 VITALS — BP 110/70 | Ht 62.5 in | Wt 200.0 lb

## 2013-05-14 DIAGNOSIS — E079 Disorder of thyroid, unspecified: Secondary | ICD-10-CM

## 2013-05-14 DIAGNOSIS — R350 Frequency of micturition: Secondary | ICD-10-CM

## 2013-05-14 DIAGNOSIS — Z01419 Encounter for gynecological examination (general) (routine) without abnormal findings: Secondary | ICD-10-CM | POA: Insufficient documentation

## 2013-05-14 DIAGNOSIS — Z833 Family history of diabetes mellitus: Secondary | ICD-10-CM

## 2013-05-14 DIAGNOSIS — Z862 Personal history of diseases of the blood and blood-forming organs and certain disorders involving the immune mechanism: Secondary | ICD-10-CM

## 2013-05-14 DIAGNOSIS — Z13220 Encounter for screening for lipoid disorders: Secondary | ICD-10-CM

## 2013-05-14 DIAGNOSIS — E7800 Pure hypercholesterolemia, unspecified: Secondary | ICD-10-CM

## 2013-05-14 DIAGNOSIS — E669 Obesity, unspecified: Secondary | ICD-10-CM

## 2013-05-14 DIAGNOSIS — N898 Other specified noninflammatory disorders of vagina: Secondary | ICD-10-CM

## 2013-05-14 LAB — URINALYSIS W MICROSCOPIC + REFLEX CULTURE
Bilirubin Urine: NEGATIVE
Ketones, ur: NEGATIVE mg/dL
Protein, ur: NEGATIVE mg/dL
Urobilinogen, UA: 0.2 mg/dL (ref 0.0–1.0)

## 2013-05-14 LAB — CBC WITH DIFFERENTIAL/PLATELET
Basophils Relative: 1 % (ref 0–1)
HCT: 42.9 % (ref 36.0–46.0)
Hemoglobin: 14.4 g/dL (ref 12.0–15.0)
MCH: 29 pg (ref 26.0–34.0)
MCHC: 33.6 g/dL (ref 30.0–36.0)
Monocytes Absolute: 0.5 10*3/uL (ref 0.1–1.0)
Monocytes Relative: 8 % (ref 3–12)
Neutro Abs: 3.5 10*3/uL (ref 1.7–7.7)

## 2013-05-14 LAB — GLUCOSE, RANDOM: Glucose, Bld: 96 mg/dL (ref 70–99)

## 2013-05-14 LAB — LIPID PANEL
Cholesterol: 221 mg/dL — ABNORMAL HIGH (ref 0–200)
HDL: 44 mg/dL (ref >39)
Total CHOL/HDL Ratio: 5 Ratio
VLDL: 31 mg/dL (ref 0–40)

## 2013-05-14 MED ORDER — FLUCONAZOLE 100 MG PO TABS: 100.0000 mg | ORAL_TABLET | Freq: Every day | ORAL | Status: DC

## 2013-05-14 NOTE — Progress Notes (Signed)
Savannah Cook Sep 20, 1966 [id number]    History:    The patient presents for annual exam. Monthly cycle/not sexually active. Ascus with negative HR HPV 2005,2008 normal Paps after. Normal mammogram history.   Past medical history, past surgical history, family history and social history were all reviewed and documented in the EPIC chart. Works at [employer]. Savannah Cook 15 doing well. Mother history of diabetes, hypertension, COPD, bipolar, living in assisted living now. Other deceased history of diabetes, lung cancer and hypertension. Having legal issues concerning fathers will with brother.    ROS:  A  ROS was performed and pertinent positives and negatives are included in the history.  Exam:  Filed Vitals:   05/14/13 0933  BP: 110/70    General appearance:  Normal Head/Neck:  Normal, without cervical or supraclavicular adenopathy. Thyroid:  Symmetrical, normal in size, without palpable masses or nodularity. Respiratory  Effort:  Normal  Auscultation:  Clear without wheezing or rhonchi Cardiovascular  Auscultation:  Regular rate, without rubs, murmurs or gallops  Edema/varicosities:  Not grossly evident Abdominal  Soft,nontender, without masses, guarding or rebound.  Liver/spleen:  No organomegaly noted  Hernia:  None appreciated  Skin  Inspection:  Grossly normal  Palpation:  Grossly normal Neurologic/psychiatric  Orientation:  Normal with appropriate conversation.  Mood/affect:  Normal  Genitourinary    Breasts: Examined lying and sitting.     Right: Without masses, retractions, discharge or axillary adenopathy.     Left: Without masses, retractions, discharge or axillary adenopathy.   Inguinal/mons:  Normal without inguinal adenopathy  External genitalia:  Normal  BUS/Urethra/Skene's glands:  Normal  Bladder:  Normal  Vagina:  Normal  Cervix:  Normal  Uterus:  normal in size, shape and contour.  Midline and mobile  Adnexa/parametria:     Rt: Without masses  or tenderness.   Lt: Without masses or tenderness.  Anus and perineum: Normal  Digital rectal exam: Normal sphincter tone without palpated masses or tenderness  Assessment/Plan:  47 y.o. DWF G1P1 for annual exam.     Ascus with negative HR HPV 2005, 2008 normal Paps after Obesity  Plan: SBE's, annual mammogram, calcium rich diet, vitamin D 1000 daily, increase exercise and decrease calories for weight loss and health. Condoms encouraged if sexually active. CBC, glucose, lipid panel, UA, Pap. New screening guidelines reviewed.    Harrington Challenger WHNP, 2:02 PM 05/14/2013

## 2013-05-14 NOTE — Patient Instructions (Addendum)

## 2013-05-14 NOTE — Addendum Note (Signed)
Addended by: Richardson Chiquito on: 05/14/2013 03:15 PM   Modules accepted: Orders

## 2013-05-16 LAB — URINE CULTURE
Colony Count: NO GROWTH
Organism ID, Bacteria: NO GROWTH

## 2013-05-19 ENCOUNTER — Encounter: Payer: Self-pay | Admitting: Gynecology

## 2013-05-22 ENCOUNTER — Encounter: Payer: Self-pay | Admitting: Women's Health

## 2013-05-27 ENCOUNTER — Encounter: Payer: Self-pay | Admitting: Women's Health

## 2014-01-01 ENCOUNTER — Ambulatory Visit (INDEPENDENT_AMBULATORY_CARE_PROVIDER_SITE_OTHER): Payer: BC Managed Care – PPO | Admitting: Women's Health

## 2014-01-01 ENCOUNTER — Encounter: Payer: Self-pay | Admitting: Women's Health

## 2014-01-01 DIAGNOSIS — N898 Other specified noninflammatory disorders of vagina: Secondary | ICD-10-CM

## 2014-01-01 LAB — WET PREP FOR TRICH, YEAST, CLUE
Clue Cells Wet Prep HPF POC: NONE SEEN
TRICH WET PREP: NONE SEEN
YEAST WET PREP: NONE SEEN

## 2014-01-01 NOTE — Progress Notes (Signed)
Patient ID: MARJO GROSVENOR, female   DOB: 1966-04-15, 48 y.o.   MRN: [id number]  Presents with vaginal irritation.  Monthly cycle/withdrawal/3 new partners. LMP 12/05/2013, reports cycle longer than normal (10 days), heavy, clotting and fishy odor. Took Diflucan 100 mg on 12/18 and 12/20 with some relief. Denies N/V/fever/dysuria/vaginal pruritis .  History of HSV, uses Valtrex during rare outbreaks. Seeks contraceptive and menopause counseling.  Exam: Appears well. External genitalia without erythema, small .5 cm pustule on right labia. Speculum exam no vaginal wall erythema. Wet prep negative. GC/Chlamydia culture taken  Vaginal irritation Contraceptive counseling STD screen  Plan: Options reviewed, Lo Loestrin once daily. Slight risk of blood clots and strokes reviewed. Condoms encouraged until permanent partner and especially first month. Declines HIV, hepatitis or RPR today. Prescription proper use given and reviewed. Encouraged to use condoms.  Valtrex prn.  GC/Chlamydia pending.   Loose clothes, instructed to call if irritation does not resolve.

## 2014-01-02 LAB — GC/CHLAMYDIA PROBE AMP
CT PROBE, AMP APTIMA: NEGATIVE
GC Probe RNA: NEGATIVE

## 2014-01-15 ENCOUNTER — Telehealth: Payer: Self-pay | Admitting: *Deleted

## 2014-01-15 NOTE — Telephone Encounter (Signed)
Telephone call, states has one small bump on perineum that looks like ingrown hair/pimple, instructed to apply antibiotic ointment if  persists office visit

## 2014-01-15 NOTE — Telephone Encounter (Signed)
Pt asked if you would personally call her at [phone] (work) or (cell) [phone]

## 2014-02-16 ENCOUNTER — Telehealth: Payer: Self-pay | Admitting: *Deleted

## 2014-02-16 NOTE — Telephone Encounter (Signed)
Pt called requesting to speak with you personally at her work # [phone] or cell [phone]. Try work # first per patient.

## 2014-02-16 NOTE — Telephone Encounter (Signed)
TC -reviewed pill use. Questions answered.

## 2014-04-29 ENCOUNTER — Telehealth: Payer: Self-pay | Admitting: *Deleted

## 2014-04-29 NOTE — Telephone Encounter (Signed)
Telephone call, states daughter having heavy cycles with dysmenorrhea, office visit today, switched to scheduling.

## 2014-04-29 NOTE — Telephone Encounter (Signed)
Pt asked if you would call her regarding her daughter regarding painful cycles on day 1. Pt said she spoke with you about at [place]. I told mother that you would be glad to see patient for OV. Mother said will do, but still like to speak with you. Call back# [phone]

## 2014-05-21 ENCOUNTER — Encounter: Payer: Self-pay | Admitting: Women's Health

## 2014-05-21 ENCOUNTER — Ambulatory Visit (INDEPENDENT_AMBULATORY_CARE_PROVIDER_SITE_OTHER): Payer: BC Managed Care – PPO | Admitting: Women's Health

## 2014-05-21 VITALS — BP 120/74 | Ht 62.5 in | Wt 189.0 lb

## 2014-05-21 DIAGNOSIS — Z833 Family history of diabetes mellitus: Secondary | ICD-10-CM

## 2014-05-21 DIAGNOSIS — E079 Disorder of thyroid, unspecified: Secondary | ICD-10-CM

## 2014-05-21 DIAGNOSIS — Z13220 Encounter for screening for lipoid disorders: Secondary | ICD-10-CM

## 2014-05-21 DIAGNOSIS — Z01419 Encounter for gynecological examination (general) (routine) without abnormal findings: Secondary | ICD-10-CM

## 2014-05-21 NOTE — Patient Instructions (Signed)

## 2014-05-21 NOTE — Progress Notes (Signed)
Savannah Cook [birth year] [id number]    History:    Presents for annual exam.  Regular monthly cycle/not sexually active. Normal mammogram history. 2005, 2008 ascus with negative HR HPV with normal Paps after. History of hypercholesteremia.  Past medical history, past surgical history, family history and social history were all reviewed and documented in the EPIC chart. Manager of apt complex. [name] 16 going to [employer]. Mother diabetes/bipolar/COPD deceased.  ROS:  A  12 point ROS was performed and pertinent positives and negatives are included.  Exam:  Filed Vitals:   05/21/14 0904  BP: 120/74    General appearance:  Normal Thyroid:  Symmetrical, normal in size, without palpable masses or nodularity. Respiratory  Auscultation:  Clear without wheezing or rhonchi Cardiovascular  Auscultation:  Regular rate, without rubs, murmurs or gallops  Edema/varicosities:  Not grossly evident Abdominal  Soft,nontender, without masses, guarding or rebound.  Liver/spleen:  No organomegaly noted  Hernia:  None appreciated  Skin  Inspection:  Grossly normal   Breasts: Examined lying and sitting.     Right: Without masses, retractions, discharge or axillary adenopathy.     Left: Without masses, retractions, discharge or axillary adenopathy. Gentitourinary   Inguinal/mons:  Normal without inguinal adenopathy  External genitalia:  Normal  BUS/Urethra/Skene's glands:  Normal  Vagina:  Normal  Cervix:  Normal  Uterus:  normal in size, shape and contour.  Midline and mobile  Adnexa/parametria:     Rt: Without masses or tenderness.   Lt: Without masses or tenderness.  Anus and perineum: Normal  Digital rectal exam: Normal sphincter tone without palpated masses or tenderness  Assessment/Plan:  48 y.o. DWF G1P1 for annual exam with no complaints.  Normal GYN exam Regular monthly cycle/not sexually active Obesity  Plan: Declines need for contraception. Condoms if sexually active. SBE's, increase  regular exercise, decrease calories, calcium rich diet, vitamin D 2000 daily and fish oil supplement encouraged. CBC, comprehensive metabolic panel, lipid panel, UA, Pap normal 2014, new screening guidelines reviewed. Screening colonoscopy at 71.   Note: This dictation was prepared with Dragon/digital dictation.  Any transcriptional errors that result are unintentional. Huel Cote [facility], 10:22 AM 05/21/2014

## 2014-05-22 LAB — URINALYSIS W MICROSCOPIC + REFLEX CULTURE
BILIRUBIN URINE: NEGATIVE
Bacteria, UA: NONE SEEN
Casts: NONE SEEN
Crystals: NONE SEEN
Glucose, UA: NEGATIVE mg/dL
HGB URINE DIPSTICK: NEGATIVE
Ketones, ur: NEGATIVE mg/dL
LEUKOCYTES UA: NEGATIVE
Nitrite: NEGATIVE
PROTEIN: NEGATIVE mg/dL
SQUAMOUS EPITHELIAL / LPF: NONE SEEN
Specific Gravity, Urine: 1.023 (ref 1.005–1.030)
Urobilinogen, UA: 0.2 mg/dL (ref 0.0–1.0)
pH: 6 (ref 5.0–8.0)

## 2014-05-27 ENCOUNTER — Telehealth: Payer: Self-pay | Admitting: Women's Health

## 2014-05-27 NOTE — Progress Notes (Signed)
I called and left message for her to call me to schedule lab appt. And explained that she left before having blood drawn at visit. Left my direct number.

## 2014-05-27 NOTE — Telephone Encounter (Signed)
No phone number found

## 2014-05-31 ENCOUNTER — Encounter: Payer: Self-pay | Admitting: Women's Health

## 2014-10-18 ENCOUNTER — Encounter: Payer: Self-pay | Admitting: Women's Health

## 2014-10-26 ENCOUNTER — Ambulatory Visit: Payer: BC Managed Care – PPO | Admitting: Women's Health

## 2014-11-19 ENCOUNTER — Encounter: Payer: Self-pay | Admitting: Women's Health

## 2014-11-19 ENCOUNTER — Ambulatory Visit (INDEPENDENT_AMBULATORY_CARE_PROVIDER_SITE_OTHER): Payer: BC Managed Care – PPO | Admitting: Women's Health

## 2014-11-19 VITALS — BP 130/78 | Wt 194.0 lb

## 2014-11-19 DIAGNOSIS — Z113 Encounter for screening for infections with a predominantly sexual mode of transmission: Secondary | ICD-10-CM

## 2014-11-19 DIAGNOSIS — N898 Other specified noninflammatory disorders of vagina: Secondary | ICD-10-CM

## 2014-11-19 LAB — WET PREP FOR TRICH, YEAST, CLUE
Clue Cells Wet Prep HPF POC: NONE SEEN
TRICH WET PREP: NONE SEEN

## 2014-11-19 MED ORDER — NYSTATIN EX POWD: CUTANEOUS | Status: DC

## 2014-11-19 NOTE — Patient Instructions (Signed)

## 2014-11-19 NOTE — Progress Notes (Signed)
Patient ID: Savannah Cook, female   DOB: 02-19-1966, 48 y.o.   MRN: [id number] Presents with complaint of vaginal discharge, questionable bump on labia, new partner/vasectomy. Continues to have monthly cycles. Denies urinary symptoms, abdominal pain or fever.  Exam: Appears well. External genitalia slight erythema at introitus, left labia half centimeter sebaceous cyst with minimal pressure exuded white dry material/resolved, speculum exam scant discharge, wet prep positive for rare yeast, GC/Chlamydia culture taken. Bimanual no CMT or adnexal fullness or tenderness.  Yeast vaginitis STD screen  Plan: Condoms encouraged until permanent partner. Diflucan 100 2 tablets by mouth today instructed to call if no relief of symptoms. GC/Chlamydia culture pending, HIV, hep B, C, RPR.

## 2014-11-20 LAB — URINALYSIS W MICROSCOPIC + REFLEX CULTURE
Bacteria, UA: NONE SEEN
Bilirubin Urine: NEGATIVE
CRYSTALS: NONE SEEN
Casts: NONE SEEN
Glucose, UA: NEGATIVE mg/dL
Hgb urine dipstick: NEGATIVE
Ketones, ur: NEGATIVE mg/dL
NITRITE: NEGATIVE
Protein, ur: NEGATIVE mg/dL
SPECIFIC GRAVITY, URINE: 1.024 (ref 1.005–1.030)
Squamous Epithelial / LPF: NONE SEEN
UROBILINOGEN UA: 0.2 mg/dL (ref 0.0–1.0)
pH: 5 (ref 5.0–8.0)

## 2014-11-20 LAB — GC/CHLAMYDIA PROBE AMP
CT PROBE, AMP APTIMA: NEGATIVE
GC Probe RNA: NEGATIVE

## 2014-11-22 LAB — URINE CULTURE

## 2014-11-23 ENCOUNTER — Other Ambulatory Visit: Payer: Self-pay | Admitting: Gynecology

## 2014-11-23 ENCOUNTER — Other Ambulatory Visit: Payer: Self-pay | Admitting: Women's Health

## 2014-11-23 DIAGNOSIS — N3000 Acute cystitis without hematuria: Secondary | ICD-10-CM

## 2014-11-23 MED ORDER — NITROFURANTOIN MONOHYD MACRO 100 MG PO CAPS: 100.0000 mg | ORAL_CAPSULE | Freq: Two times a day (BID) | ORAL | Status: DC

## 2014-12-03 ENCOUNTER — Other Ambulatory Visit: Payer: BC Managed Care – PPO

## 2014-12-03 ENCOUNTER — Encounter: Payer: Self-pay | Admitting: Women's Health

## 2014-12-03 ENCOUNTER — Ambulatory Visit (INDEPENDENT_AMBULATORY_CARE_PROVIDER_SITE_OTHER): Payer: BC Managed Care – PPO | Admitting: Women's Health

## 2014-12-03 VITALS — BP 124/80 | Ht 63.0 in | Wt 198.0 lb

## 2014-12-03 DIAGNOSIS — B9689 Other specified bacterial agents as the cause of diseases classified elsewhere: Secondary | ICD-10-CM

## 2014-12-03 DIAGNOSIS — N760 Acute vaginitis: Secondary | ICD-10-CM

## 2014-12-03 DIAGNOSIS — A499 Bacterial infection, unspecified: Secondary | ICD-10-CM

## 2014-12-03 LAB — WET PREP FOR TRICH, YEAST, CLUE
Trich, Wet Prep: NONE SEEN
Yeast Wet Prep HPF POC: NONE SEEN

## 2014-12-03 MED ORDER — METRONIDAZOLE 500 MG PO TABS: 500.0000 mg | ORAL_TABLET | Freq: Two times a day (BID) | ORAL | Status: DC

## 2014-12-03 NOTE — Addendum Note (Signed)
Addended by: Burnett Kanaris on: 12/03/2014 03:39 PM   Modules accepted: Orders

## 2014-12-03 NOTE — Progress Notes (Signed)
Patient ID: Savannah Cook, female   DOB: October 20, 1966, 48 y.o.   MRN: [id number] Resents with complaint of painful pimple-like bump on left labia. States has been uncomfortable for approximately one week. Also having some vaginal discharge with questionable odor. Denies vaginal itching, abdominal pain, urinary symptoms or fever.   Exam: Appears well. Left labia 2 cm indurated pustule with slight pressure moderate amount of bloody mucousy discharge drained, area completely flat, good relief of discomfort. Wet prep done with a Q-tip, positive for amines, clues, TNTC bacteria. Bimanual no CMT or adnexal fullness or tenderness.  Bacteria vaginosis  Plan: Keep perineum clean, dry, apply small amount of Neosporin to resolving lesion. Instructed to call if further problems. Flagyl 500 twice daily for 7 days, alcohol precautions reviewed. Instructed to call if no relief of discharge.

## 2014-12-03 NOTE — Patient Instructions (Signed)
Bacterial Vaginosis Bacterial vaginosis is an infection of the vagina. It happens when too many of certain germs (bacteria) grow in the vagina. HOME CARE  Take your medicine as told by your doctor.  Finish your medicine even if you start to feel better.  Do not have sex until you finish your medicine and are better.  Tell your sex partner that you have an infection. They should see their doctor for treatment.  Practice safe sex. Use condoms. Have only one sex partner. GET HELP IF:  You are not getting better after 3 days of treatment.  You have more grey fluid (discharge) coming from your vagina than before.  You have more pain than before.  You have a fever. MAKE SURE YOU:   Understand these instructions.  Will watch your condition.  Will get help right away if you are not doing well or get worse. Document Released: 09/11/2008 Document Revised: 09/23/2013 Document Reviewed: 07/15/2013 ExitCare Patient Information 2015 [employer]. This information is not intended to replace advice given to you by your health care provider. Make sure you discuss any questions you have with your health care provider.  

## 2015-01-13 ENCOUNTER — Encounter: Payer: BC Managed Care – PPO | Admitting: Women's Health

## 2015-06-08 ENCOUNTER — Telehealth: Payer: Self-pay | Admitting: *Deleted

## 2015-06-08 NOTE — Telephone Encounter (Signed)
Pt called requesting to speak with you about her upcoming annual regarding her labs and they way it should be coded. Work [phone], cell [phone]

## 2015-06-08 NOTE — Telephone Encounter (Signed)
Telephone call, questions answered needed labs coded as problem not as routine

## 2015-06-09 ENCOUNTER — Other Ambulatory Visit (HOSPITAL_COMMUNITY)
Admission: RE | Admit: 2015-06-09 | Discharge: 2015-06-09 | Disposition: A | Discharge status: Home / Self Care | Payer: BLUE CROSS/BLUE SHIELD | Source: Ambulatory Visit | Attending: Gynecology | Admitting: Gynecology

## 2015-06-09 ENCOUNTER — Encounter: Payer: Self-pay | Admitting: Women's Health

## 2015-06-09 ENCOUNTER — Ambulatory Visit (INDEPENDENT_AMBULATORY_CARE_PROVIDER_SITE_OTHER): Payer: BLUE CROSS/BLUE SHIELD | Admitting: Women's Health

## 2015-06-09 VITALS — BP 118/80 | Ht 63.0 in | Wt 197.0 lb

## 2015-06-09 DIAGNOSIS — Z01419 Encounter for gynecological examination (general) (routine) without abnormal findings: Secondary | ICD-10-CM

## 2015-06-09 DIAGNOSIS — D509 Iron deficiency anemia, unspecified: Secondary | ICD-10-CM | POA: Diagnosis not present

## 2015-06-09 DIAGNOSIS — E7800 Pure hypercholesterolemia, unspecified: Secondary | ICD-10-CM

## 2015-06-09 DIAGNOSIS — Z8639 Personal history of other endocrine, nutritional and metabolic disease: Secondary | ICD-10-CM | POA: Diagnosis not present

## 2015-06-09 LAB — CBC WITH DIFFERENTIAL/PLATELET
BASOS ABS: 0 10*3/uL (ref 0.0–0.1)
Basophils Relative: 0 % (ref 0–1)
Eosinophils Absolute: 0.2 10*3/uL (ref 0.0–0.7)
Eosinophils Relative: 3 % (ref 0–5)
HCT: 40.7 % (ref 36.0–46.0)
HEMOGLOBIN: 13.4 g/dL (ref 12.0–15.0)
LYMPHS ABS: 2.4 10*3/uL (ref 0.7–4.0)
LYMPHS PCT: 31 % (ref 12–46)
MCH: 28.5 pg (ref 26.0–34.0)
MCHC: 32.9 g/dL (ref 30.0–36.0)
MCV: 86.4 fL (ref 78.0–100.0)
MPV: 9.9 fL (ref 8.6–12.4)
Monocytes Absolute: 0.6 10*3/uL (ref 0.1–1.0)
Monocytes Relative: 8 % (ref 3–12)
NEUTROS PCT: 58 % (ref 43–77)
Neutro Abs: 4.6 10*3/uL (ref 1.7–7.7)
PLATELETS: 332 10*3/uL (ref 150–400)
RBC: 4.71 MIL/uL (ref 3.87–5.11)
RDW: 14.4 % (ref 11.5–15.5)
WBC: 7.9 10*3/uL (ref 4.0–10.5)

## 2015-06-09 LAB — LIPID PANEL
Cholesterol: 200 mg/dL (ref 0–200)
HDL: 52 mg/dL (ref >=46)
LDL Cholesterol: 127 mg/dL — ABNORMAL HIGH (ref 0–99)
Total CHOL/HDL Ratio: 3.8 Ratio
Triglycerides: 103 mg/dL (ref <150)
VLDL: 21 mg/dL (ref 0–40)

## 2015-06-09 LAB — GLUCOSE, RANDOM: GLUCOSE: 92 mg/dL (ref 70–99)

## 2015-06-09 NOTE — Progress Notes (Signed)
Savannah Cook 1966/05/27 [id number]    History:    Presents for annual exam.  Right monthly cycle/not sexually active. Normal mammogram history. 2008 ascus with negative HR HPV, normal Paps after.  Past medical history, past surgical history, family history and social history were all reviewed and documented in the EPIC chart. 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 Mother diabetes, COPD, bipolar deceased. Father died of old age. 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  ROS:  A ROS was performed and pertinent positives and negatives are included.  Exam:  Filed Vitals:   06/09/15 0906  BP: 118/80    General appearance:  Normal Thyroid:  Symmetrical, normal in size, without palpable masses or nodularity. Respiratory  Auscultation:  Clear without wheezing or rhonchi Cardiovascular  Auscultation:  Regular rate, without rubs, murmurs or gallops  Edema/varicosities:  Not grossly evident Abdominal  Soft,nontender, without masses, guarding or rebound.  Liver/spleen:  No organomegaly noted  Hernia:  None appreciated  Skin  Inspection:  Grossly normal   Breasts: Examined lying and sitting.     Right: Without masses, retractions, discharge or axillary adenopathy.     Left: Without masses, retractions, discharge or axillary adenopathy. Gentitourinary   Inguinal/mons:  Normal without inguinal adenopathy  External genitalia:  Normal  BUS/Urethra/Skene's glands:  Normal  Vagina:  Normal  Cervix:  Normal  Uterus: normal in size, shape and contour.  Midline and mobile  Adnexa/parametria:     Rt: Without masses or tenderness.   Lt: Without masses or tenderness.  Anus and perineum: Normal  Digital rectal exam: Normal sphincter tone without palpated masses or tenderness  Assessment/Plan:  49 y.o. DWF G1P1 for annual exam with no complaints.  Light monthly cycle/not sexually active/no menopausal symptoms Overweight  Plan: Denies need for contraception will  use condoms if active. SBE's, continue annual screening mammogram, calcium rich diet, increaseregular exercise and decrease calories, vitamin D 1000 daily encouraged. CBC, glucose, lipid panel, UA, Pap normal 2014, and new screening guidelines reviewed. Screening colonoscopy, [facility] information given and reviewed.    Huel Cote WHNP, 1:24 PM 06/09/2015

## 2015-06-09 NOTE — Patient Instructions (Signed)
Health Maintenance Adopting a healthy lifestyle and getting preventive care can go a long way to promote health and wellness. Talk with your health care provider about what schedule of regular examinations is right for you. This is a good chance for you to check in with your provider about disease prevention and staying healthy. In between checkups, there are plenty of things you can do on your own. Experts have done a lot of research about which lifestyle changes and preventive measures are most likely to keep you healthy. Ask your health care provider for more information. WEIGHT AND DIET  Eat a healthy diet  Be sure to include plenty of vegetables, fruits, low-fat dairy products, and lean protein.  Do not eat a lot of foods high in solid fats, added sugars, or salt.  Get regular exercise. This is one of the most important things you can do for your health.  Most adults should exercise for at least 150 minutes each week. The exercise should increase your heart rate and make you sweat (moderate-intensity exercise).  Most adults should also do strengthening exercises at least twice a week. This is in addition to the moderate-intensity exercise.  Maintain a healthy weight  Body mass index (BMI) is a measurement that can be used to identify possible weight problems. It estimates body fat based on height and weight. Your health care provider can help determine your BMI and help you achieve or maintain a healthy weight.  For females 25 years of age and older:   A BMI below 18.5 is considered underweight.  A BMI of 18.5 to 24.9 is normal.  A BMI of 25 to 29.9 is considered overweight.  A BMI of 30 and above is considered obese.  Watch levels of cholesterol and blood lipids  You should start having your blood tested for lipids and cholesterol at 51 years of age, then have this test every 5 years.  You may need to have your cholesterol levels checked more often if:  Your lipid or  cholesterol levels are high.  You are older than 51 years of age.  You are at high risk for heart disease.  CANCER SCREENING   Lung Cancer  Lung cancer screening is recommended for adults 97-92 years old who are at high risk for lung cancer because of a history of smoking.  A yearly low-dose CT scan of the lungs is recommended for people who:  Currently smoke.  Have quit within the past 15 years.  Have at least a 30-pack-year history of smoking. A pack year is smoking an average of one pack of cigarettes a day for 1 year.  Yearly screening should continue until it has been 15 years since you quit.  Yearly screening should stop if you develop a health problem that would prevent you from having lung cancer treatment.  Breast Cancer  Practice breast self-awareness. This means understanding how your breasts normally appear and feel.  It also means doing regular breast self-exams. Let your health care provider know about any changes, no matter how small.  If you are in your 20s or 30s, you should have a clinical breast exam (CBE) by a health care provider every 1-3 years as part of a regular health exam.  If you are 76 or older, have a CBE every year. Also consider having a breast X-ray (mammogram) every year.  If you have a family history of breast cancer, talk to your health care provider about genetic screening.  If you are  at high risk for breast cancer, talk to your health care provider about having an MRI and a mammogram every year.  Breast cancer gene (BRCA) assessment is recommended for women who have family members with BRCA-related cancers. BRCA-related cancers include:  Breast.  Ovarian.  Tubal.  Peritoneal cancers.  Results of the assessment will determine the need for genetic counseling and BRCA1 and BRCA2 testing. Cervical Cancer Routine pelvic examinations to screen for cervical cancer are no longer recommended for nonpregnant women who are considered low  risk for cancer of the pelvic organs (ovaries, uterus, and vagina) and who do not have symptoms. A pelvic examination may be necessary if you have symptoms including those associated with pelvic infections. Ask your health care provider if a screening pelvic exam is right for you.   The Pap test is the screening test for cervical cancer for women who are considered at risk.  If you had a hysterectomy for a problem that was not cancer or a condition that could lead to cancer, then you no longer need Pap tests.  If you are older than 65 years, and you have had normal Pap tests for the past 10 years, you no longer need to have Pap tests.  If you have had past treatment for cervical cancer or a condition that could lead to cancer, you need Pap tests and screening for cancer for at least 20 years after your treatment.  If you no longer get a Pap test, assess your risk factors if they change (such as having a new sexual partner). This can affect whether you should start being screened again.  Some women have medical problems that increase their chance of getting cervical cancer. If this is the case for you, your health care provider may recommend more frequent screening and Pap tests.  The human papillomavirus (HPV) test is another test that may be used for cervical cancer screening. The HPV test looks for the virus that can cause cell changes in the cervix. The cells collected during the Pap test can be tested for HPV.  The HPV test can be used to screen women 30 years of age and older. Getting tested for HPV can extend the interval between normal Pap tests from three to five years.  An HPV test also should be used to screen women of any age who have unclear Pap test results.  After 51 years of age, women should have HPV testing as often as Pap tests.  Colorectal Cancer  This type of cancer can be detected and often prevented.  Routine colorectal cancer screening usually begins at 50 years of  age and continues through 51 years of age.  Your health care provider may recommend screening at an earlier age if you have risk factors for colon cancer.  Your health care provider may also recommend using home test kits to check for hidden blood in the stool.  A small camera at the end of a tube can be used to examine your colon directly (sigmoidoscopy or colonoscopy). This is done to check for the earliest forms of colorectal cancer.  Routine screening usually begins at age 50.  Direct examination of the colon should be repeated every 5-10 years through 51 years of age. However, you may need to be screened more often if early forms of precancerous polyps or small growths are found. Skin Cancer  Check your skin from head to toe regularly.  Tell your health care provider about any new moles or changes in   moles, especially if there is a change in a mole's shape or color.  Also tell your health care provider if you have a mole that is larger than the size of a pencil eraser.  Always use sunscreen. Apply sunscreen liberally and repeatedly throughout the day.  Protect yourself by wearing long sleeves, pants, a wide-brimmed hat, and sunglasses whenever you are outside. HEART DISEASE, DIABETES, AND HIGH BLOOD PRESSURE   Have your blood pressure checked at least every 1-2 years. High blood pressure causes heart disease and increases the risk of stroke.  If you are between 75 years and 42 years old, ask your health care provider if you should take aspirin to prevent strokes.  Have regular diabetes screenings. This involves taking a blood sample to check your fasting blood sugar level.  If you are at a normal weight and have a low risk for diabetes, have this test once every three years after 51 years of age.  If you are overweight and have a high risk for diabetes, consider being tested at a younger age or more often. PREVENTING INFECTION  Hepatitis B  If you have a higher risk for  hepatitis B, you should be screened for this virus. You are considered at high risk for hepatitis B if:  You were born in a country where hepatitis B is common. Ask your health care provider which countries are considered high risk.  Your parents were born in a high-risk country, and you have not been immunized against hepatitis B (hepatitis B vaccine).  You have HIV or AIDS.  You use needles to inject street drugs.  You live with someone who has hepatitis B.  You have had sex with someone who has hepatitis B.  You get hemodialysis treatment.  You take certain medicines for conditions, including cancer, organ transplantation, and autoimmune conditions. Hepatitis C  Blood testing is recommended for:  Everyone born from [birth year] through 1967.  Anyone with known risk factors for hepatitis C. Sexually transmitted infections (STIs)  You should be screened for sexually transmitted infections (STIs) including gonorrhea and chlamydia if:  You are sexually active and are younger than 51 years of age.  You are older than 51 years of age and your health care provider tells you that you are at risk for this type of infection.  Your sexual activity has changed since you were last screened and you are at an increased risk for chlamydia or gonorrhea. Ask your health care provider if you are at risk.  If you do not have HIV, but are at risk, it may be recommended that you take a prescription medicine daily to prevent HIV infection. This is called pre-exposure prophylaxis (PrEP). You are considered at risk if:  You are sexually active and do not regularly use condoms or know the HIV status of your partner(s).  You take drugs by injection.  You are sexually active with a partner who has HIV. Talk with your health care provider about whether you are at high risk of being infected with HIV. If you choose to begin PrEP, you should first be tested for HIV. You should then be tested every 3 months for  as long as you are taking PrEP.  PREGNANCY   If you are premenopausal and you may become pregnant, ask your health care provider about preconception counseling.  If you may become pregnant, take 400 to 800 micrograms (mcg) of folic acid every day.  If you want to prevent pregnancy, talk to your  health care provider about birth control (contraception). OSTEOPOROSIS AND MENOPAUSE   Osteoporosis is a disease in which the bones lose minerals and strength with aging. This can result in serious bone fractures. Your risk for osteoporosis can be identified using a bone density scan.  If you are 65 years of age or older, or if you are at risk for osteoporosis and fractures, ask your health care provider if you should be screened.  Ask your health care provider whether you should take a calcium or vitamin D supplement to lower your risk for osteoporosis.  Menopause may have certain physical symptoms and risks.  Hormone replacement therapy may reduce some of these symptoms and risks. Talk to your health care provider about whether hormone replacement therapy is right for you.  HOME CARE INSTRUCTIONS   Schedule regular health, dental, and eye exams.  Stay current with your immunizations.   Do not use any tobacco products including cigarettes, chewing tobacco, or electronic cigarettes.  If you are pregnant, do not drink alcohol.  If you are breastfeeding, limit how much and how often you drink alcohol.  Limit alcohol intake to no more than 1 drink per day for nonpregnant women. One drink equals 12 ounces of beer, 5 ounces of wine, or 1 ounces of hard liquor.  Do not use street drugs.  Do not share needles.  Ask your health care provider for help if you need support or information about quitting drugs.  Tell your health care provider if you often feel depressed.  Tell your health care provider if you have ever been abused or do not feel safe at home. Document Released: 06/18/2011  Document Revised: 04/19/2014 Document Reviewed: 11/04/2013 Exit[employer] Patient Information 2015 [employer]. This information is not intended to replace advice given to you by your health care provider. Make sure you discuss any questions you have with your health care provider. Exercise to Stay Healthy Exercise helps you become and stay healthy. EXERCISE IDEAS AND TIPS Choose exercises that:  You enjoy.  Fit into your day. You do not need to exercise really hard to be healthy. You can do exercises at a slow or medium level and stay healthy. You can:  Stretch before and after working out.  Try yoga, Pilates, or tai chi.  Lift weights.  Walk fast, swim, jog, run, climb stairs, bicycle, dance, or rollerskate.  Take aerobic classes. Exercises that burn about 150 calories:  Running 1  miles in 15 minutes.  Playing volleyball for 45 to 60 minutes.  Washing and waxing a car for 45 to 60 minutes.  Playing touch football for 45 minutes.  Walking 1  miles in 35 minutes.  Pushing a stroller 1  miles in 30 minutes.  Playing basketball for 30 minutes.  Raking leaves for 30 minutes.  Bicycling 5 miles in 30 minutes.  Walking 2 miles in 30 minutes.  Dancing for 30 minutes.  Shoveling snow for 15 minutes.  Swimming laps for 20 minutes.  Walking up stairs for 15 minutes.  Bicycling 4 miles in 15 minutes.  Gardening for 30 to 45 minutes.  Jumping rope for 15 minutes.  Washing windows or floors for 45 to 60 minutes. Document Released: 01/05/2011 Document Revised: 02/25/2012 Document Reviewed: 01/05/2011 Exit[employer] Patient Information 2015 [employer]. This information is not intended to replace advice given to you by your health care provider. Make sure you discuss any questions you have with your health care provider.  

## 2015-06-10 ENCOUNTER — Encounter: Payer: Self-pay | Admitting: Women's Health

## 2015-06-10 LAB — URINALYSIS W MICROSCOPIC + REFLEX CULTURE
Bacteria, UA: NONE SEEN
Bilirubin Urine: NEGATIVE
CASTS: NONE SEEN
Crystals: NONE SEEN
Glucose, UA: NEGATIVE mg/dL
HGB URINE DIPSTICK: NEGATIVE
KETONES UR: NEGATIVE mg/dL
LEUKOCYTES UA: NEGATIVE
Nitrite: NEGATIVE
PROTEIN: NEGATIVE mg/dL
Specific Gravity, Urine: 1.02 (ref 1.005–1.030)
UROBILINOGEN UA: 0.2 mg/dL (ref 0.0–1.0)
pH: 6 (ref 5.0–8.0)

## 2015-06-10 LAB — CYTOLOGY - PAP

## 2015-07-27 ENCOUNTER — Ambulatory Visit (INDEPENDENT_AMBULATORY_CARE_PROVIDER_SITE_OTHER): Payer: BLUE CROSS/BLUE SHIELD | Admitting: Women's Health

## 2015-07-27 ENCOUNTER — Encounter: Payer: Self-pay | Admitting: Women's Health

## 2015-07-27 VITALS — BP 124/80 | Ht 63.0 in | Wt 195.0 lb

## 2015-07-27 DIAGNOSIS — N760 Acute vaginitis: Secondary | ICD-10-CM | POA: Diagnosis not present

## 2015-07-27 DIAGNOSIS — N898 Other specified noninflammatory disorders of vagina: Secondary | ICD-10-CM | POA: Diagnosis not present

## 2015-07-27 DIAGNOSIS — A499 Bacterial infection, unspecified: Secondary | ICD-10-CM | POA: Diagnosis not present

## 2015-07-27 DIAGNOSIS — B9689 Other specified bacterial agents as the cause of diseases classified elsewhere: Secondary | ICD-10-CM | POA: Insufficient documentation

## 2015-07-27 LAB — URINALYSIS W MICROSCOPIC + REFLEX CULTURE
Bilirubin Urine: NEGATIVE
CRYSTALS: NONE SEEN [HPF]
Casts: NONE SEEN [LPF]
Glucose, UA: NEGATIVE
Ketones, ur: NEGATIVE
LEUKOCYTES UA: NEGATIVE
Nitrite: NEGATIVE
PROTEIN: NEGATIVE
Specific Gravity, Urine: 1.015 (ref 1.001–1.035)
WBC, UA: NONE SEEN WBC/HPF (ref <=5)
YEAST: NONE SEEN [HPF]
pH: 5.5 (ref 5.0–8.0)

## 2015-07-27 LAB — WET PREP FOR TRICH, YEAST, CLUE
Trich, Wet Prep: NONE SEEN
YEAST WET PREP: NONE SEEN

## 2015-07-27 MED ORDER — CLINDAMYCIN PHOSPHATE 1 % EX LOTN: TOPICAL_LOTION | Freq: Two times a day (BID) | CUTANEOUS | Status: DC

## 2015-07-27 MED ORDER — METRONIDAZOLE 0.75 % VA GEL: VAGINAL | Status: DC

## 2015-07-27 NOTE — Progress Notes (Signed)
Patient ID: Savannah Cook, female   DOB: 25-Apr-1966, 49 y.o.   MRN: [id number] Presents with increased urinary frequency without pain or burning, vaginal irritation with occasional odor. Denies abdominal pain or fever.  Monthly cycle/same partner.  Exam: Appears well. External genitalia mild erythema at introitus, speculum exam scant white discharge wet prep positive for amines, clues, TNTC bacteria. Bimanual no CMT or adnexal fullness or tenderness. UA: Trace blood, no wbc's, 0 did 2 RBCs, many bacteria.  Bacteria vaginosis  Plan: MetroGel vaginal cream 1 applicator at bedtime 5 prescription, proper use given and reviewed alcohol precautions. UA culture pending.

## 2015-07-27 NOTE — Patient Instructions (Signed)
Bacterial Vaginosis Bacterial vaginosis is a vaginal infection that occurs when the normal balance of bacteria in the vagina is disrupted. It results from an overgrowth of certain bacteria. This is the most common vaginal infection in women of childbearing age. Treatment is important to prevent complications, especially in pregnant women, as it can cause a premature delivery. CAUSES  Bacterial vaginosis is caused by an increase in harmful bacteria that are normally present in smaller amounts in the vagina. Several different kinds of bacteria can cause bacterial vaginosis. However, the reason that the condition develops is not fully understood. RISK FACTORS Certain activities or behaviors can put you at an increased risk of developing bacterial vaginosis, including:  Having a new sex partner or multiple sex partners.  Douching.  Using an intrauterine device (IUD) for contraception. Women do not get bacterial vaginosis from toilet seats, bedding, swimming pools, or contact with objects around them. SIGNS AND SYMPTOMS  Some women with bacterial vaginosis have no signs or symptoms. Common symptoms include:  Grey vaginal discharge.  A fishlike odor with discharge, especially after sexual intercourse.  Itching or burning of the vagina and vulva.  Burning or pain with urination. DIAGNOSIS  Your health care provider will take a medical history and examine the vagina for signs of bacterial vaginosis. A sample of vaginal fluid may be taken. Your health care provider will look at this sample under a microscope to check for bacteria and abnormal cells. A vaginal pH test may also be done.  TREATMENT  Bacterial vaginosis may be treated with antibiotic medicines. These may be given in the form of a pill or a vaginal cream. A second round of antibiotics may be prescribed if the condition comes back after treatment.  HOME CARE INSTRUCTIONS   Only take over-the-counter or prescription medicines as  directed by your health care provider.  If antibiotic medicine was prescribed, take it as directed. Make sure you finish it even if you start to feel better.  Do not have sex until treatment is completed.  Tell all sexual partners that you have a vaginal infection. They should see their health care provider and be treated if they have problems, such as a mild rash or itching.  Practice safe sex by using condoms and only having one sex partner. SEEK MEDICAL CARE IF:   Your symptoms are not improving after 3 days of treatment.  You have increased discharge or pain.  You have a fever. MAKE SURE YOU:   Understand these instructions.  Will watch your condition.  Will get help right away if you are not doing well or get worse. FOR MORE INFORMATION  Centers for Disease Control and Prevention, Division of STD Prevention: www.cdc.gov/std American Sexual Health Association (ASHA): www.ashastd.org  Document Released: 12/03/2005 Document Revised: 09/23/2013 Document Reviewed: 07/15/2013 Exit[employer] Patient Information 2015 [employer]. This information is not intended to replace advice given to you by your health care provider. Make sure you discuss any questions you have with your health care provider.  

## 2015-07-28 LAB — URINE CULTURE
Colony Count: NO GROWTH
ORGANISM ID, BACTERIA: NO GROWTH

## 2015-11-18 ENCOUNTER — Other Ambulatory Visit: Payer: Self-pay

## 2015-11-18 DIAGNOSIS — N898 Other specified noninflammatory disorders of vagina: Secondary | ICD-10-CM

## 2015-11-18 MED ORDER — CLINDAMYCIN PHOSPHATE 1 % EX LOTN: TOPICAL_LOTION | Freq: Two times a day (BID) | CUTANEOUS | Status: DC

## 2016-03-20 ENCOUNTER — Telehealth: Payer: Self-pay | Admitting: *Deleted

## 2016-03-20 DIAGNOSIS — D509 Iron deficiency anemia, unspecified: Secondary | ICD-10-CM

## 2016-03-20 NOTE — Telephone Encounter (Signed)
Ok, please  check CBC

## 2016-03-20 NOTE — Telephone Encounter (Signed)
Left on voicemail to call and schedule lab appointment order placed.

## 2016-03-20 NOTE — Telephone Encounter (Signed)
Pt called c/o craving ice chips asked if she could have lab test to see if she is anemic? Pt annual due in June. Please advise

## 2016-03-21 ENCOUNTER — Other Ambulatory Visit: Payer: BLUE CROSS/BLUE SHIELD

## 2016-03-21 DIAGNOSIS — D509 Iron deficiency anemia, unspecified: Secondary | ICD-10-CM

## 2016-03-26 DIAGNOSIS — D509 Iron deficiency anemia, unspecified: Secondary | ICD-10-CM | POA: Diagnosis not present

## 2016-03-26 LAB — CBC
HCT: 32.9 % — ABNORMAL LOW (ref 35.0–45.0)
Hemoglobin: 10.4 g/dL — ABNORMAL LOW (ref 11.7–15.5)
MCH: 24.1 pg — AB (ref 27.0–33.0)
MCHC: 31.6 g/dL — AB (ref 32.0–36.0)
MCV: 76.3 fL — ABNORMAL LOW (ref 80.0–100.0)
MPV: 9.6 fL (ref 7.5–12.5)
PLATELETS: 425 10*3/uL — AB (ref 140–400)
RBC: 4.31 MIL/uL (ref 3.80–5.10)
RDW: 16 % — AB (ref 11.0–15.0)
WBC: 7.3 10*3/uL (ref 3.8–10.8)

## 2016-05-04 ENCOUNTER — Telehealth: Payer: Self-pay | Admitting: *Deleted

## 2016-05-04 NOTE — Telephone Encounter (Signed)
Pt called asking if we could do a finger prick to check, I advised pt we do not do this here at our office.  Pt would like to have this done to check her iron, pt will wait until annual in June.

## 2016-06-08 DIAGNOSIS — Z1231 Encounter for screening mammogram for malignant neoplasm of breast: Secondary | ICD-10-CM | POA: Diagnosis not present

## 2016-06-12 ENCOUNTER — Other Ambulatory Visit: Payer: Self-pay | Admitting: Women's Health

## 2016-06-12 ENCOUNTER — Telehealth: Payer: Self-pay | Admitting: *Deleted

## 2016-06-12 DIAGNOSIS — R7309 Other abnormal glucose: Secondary | ICD-10-CM

## 2016-06-12 DIAGNOSIS — E7800 Pure hypercholesterolemia, unspecified: Secondary | ICD-10-CM

## 2016-06-12 DIAGNOSIS — D509 Iron deficiency anemia, unspecified: Secondary | ICD-10-CM

## 2016-06-12 DIAGNOSIS — E559 Vitamin D deficiency, unspecified: Secondary | ICD-10-CM

## 2016-06-12 NOTE — Telephone Encounter (Signed)
Pt has annual scheduled on 06/13/16 would like to have labs done prior to annual and the [facility] on the 2nd floor. Pt asked that the correct codes be used this time, had last with insurance coverage last time. She would also like a HgA1C drawn. Pt said you are aware of the issue last time. Please advise

## 2016-06-12 NOTE — Telephone Encounter (Signed)
Telephone call, would like to have lab work drawn prior to office visit states gets nervous with lab draws.

## 2016-06-13 ENCOUNTER — Ambulatory Visit (INDEPENDENT_AMBULATORY_CARE_PROVIDER_SITE_OTHER): Payer: BLUE CROSS/BLUE SHIELD | Admitting: Women's Health

## 2016-06-13 ENCOUNTER — Encounter: Payer: BLUE CROSS/BLUE SHIELD | Admitting: Women's Health

## 2016-06-13 ENCOUNTER — Encounter: Payer: Self-pay | Admitting: Women's Health

## 2016-06-13 VITALS — BP 122/80 | Ht 63.0 in | Wt 201.0 lb

## 2016-06-13 DIAGNOSIS — A499 Bacterial infection, unspecified: Secondary | ICD-10-CM

## 2016-06-13 DIAGNOSIS — B9689 Other specified bacterial agents as the cause of diseases classified elsewhere: Secondary | ICD-10-CM

## 2016-06-13 DIAGNOSIS — R7309 Other abnormal glucose: Secondary | ICD-10-CM | POA: Diagnosis not present

## 2016-06-13 DIAGNOSIS — E7800 Pure hypercholesterolemia, unspecified: Secondary | ICD-10-CM | POA: Diagnosis not present

## 2016-06-13 DIAGNOSIS — Z01419 Encounter for gynecological examination (general) (routine) without abnormal findings: Secondary | ICD-10-CM | POA: Diagnosis not present

## 2016-06-13 DIAGNOSIS — E559 Vitamin D deficiency, unspecified: Secondary | ICD-10-CM | POA: Diagnosis not present

## 2016-06-13 DIAGNOSIS — D509 Iron deficiency anemia, unspecified: Secondary | ICD-10-CM

## 2016-06-13 DIAGNOSIS — N898 Other specified noninflammatory disorders of vagina: Secondary | ICD-10-CM | POA: Diagnosis not present

## 2016-06-13 DIAGNOSIS — N760 Acute vaginitis: Secondary | ICD-10-CM | POA: Diagnosis not present

## 2016-06-13 LAB — URINALYSIS W MICROSCOPIC + REFLEX CULTURE
Bilirubin Urine: NEGATIVE
Casts: NONE SEEN [LPF]
Crystals: NONE SEEN [HPF]
Glucose, UA: NEGATIVE
Hgb urine dipstick: NEGATIVE
Ketones, ur: NEGATIVE
Leukocytes, UA: NEGATIVE
Nitrite: NEGATIVE
Protein, ur: NEGATIVE
RBC / HPF: NONE SEEN RBC/HPF (ref <=2)
Specific Gravity, Urine: 1.025 (ref 1.001–1.035)
WBC, UA: NONE SEEN WBC/HPF (ref <=5)
YEAST: NONE SEEN [HPF]
pH: 5.5 (ref 5.0–8.0)

## 2016-06-13 LAB — CBC WITH DIFFERENTIAL/PLATELET
BASOS ABS: 0 {cells}/uL (ref 0–200)
Basophils Relative: 0 %
EOS ABS: 438 {cells}/uL (ref 15–500)
Eosinophils Relative: 6 %
HEMATOCRIT: 37.6 % (ref 35.0–45.0)
Hemoglobin: 12.1 g/dL (ref 11.7–15.5)
LYMPHS PCT: 31 %
Lymphs Abs: 2263 cells/uL (ref 850–3900)
MCH: 26.3 pg — AB (ref 27.0–33.0)
MCHC: 32.2 g/dL (ref 32.0–36.0)
MCV: 81.7 fL (ref 80.0–100.0)
MONO ABS: 511 {cells}/uL (ref 200–950)
MPV: 9.7 fL (ref 7.5–12.5)
Monocytes Relative: 7 %
NEUTROS PCT: 56 %
Neutro Abs: 4088 cells/uL (ref 1500–7800)
Platelets: 377 10*3/uL (ref 140–400)
RBC: 4.6 MIL/uL (ref 3.80–5.10)
RDW: 17.5 % — AB (ref 11.0–15.0)
WBC: 7.3 10*3/uL (ref 3.8–10.8)

## 2016-06-13 LAB — WET PREP FOR TRICH, YEAST, CLUE
TRICH WET PREP: NONE SEEN
YEAST WET PREP: NONE SEEN

## 2016-06-13 LAB — LIPID PANEL
Cholesterol: 179 mg/dL (ref 125–200)
HDL: 48 mg/dL (ref >=46)
LDL CALC: 109 mg/dL (ref <130)
Total CHOL/HDL Ratio: 3.7 Ratio (ref <=5.0)
Triglycerides: 112 mg/dL (ref <150)
VLDL: 22 mg/dL (ref <30)

## 2016-06-13 MED ORDER — METRONIDAZOLE 500 MG PO TABS: 500.0000 mg | ORAL_TABLET | Freq: Two times a day (BID) | ORAL | Status: DC

## 2016-06-13 NOTE — Patient Instructions (Addendum)
Menopause is a normal process in which your reproductive ability comes to an end. This process happens gradually over a span of months to years, usually between the ages of 48 and 55. Menopause is complete when you have missed 12 consecutive menstrual periods. It is important to talk with your health care provider about some of the most common conditions that affect postmenopausal women, such as heart disease, cancer, and bone loss (osteoporosis). Adopting a healthy lifestyle and getting preventive care can help to promote your health and wellness. Those actions can also lower your chances of developing some of these common conditions. WHAT SHOULD I KNOW ABOUT MENOPAUSE? During menopause, you may experience a number of symptoms, such as:  Moderate-to-severe hot flashes.  Night sweats.  Decrease in sex drive.  Mood swings.  Headaches.  Tiredness.  Irritability.  Memory problems.  Insomnia. Choosing to treat or not to treat menopausal changes is an individual decision that you make with your health care provider. WHAT SHOULD I KNOW ABOUT HORMONE REPLACEMENT THERAPY AND SUPPLEMENTS? Hormone therapy products are effective for treating symptoms that are associated with menopause, such as hot flashes and night sweats. Hormone replacement carries certain risks, especially as you become older. If you are thinking about using estrogen or estrogen with progestin treatments, discuss the benefits and risks with your health care provider. WHAT SHOULD I KNOW ABOUT HEART DISEASE AND STROKE? Heart disease, heart attack, and stroke become more likely as you age. This may be due, in part, to the hormonal changes that your body experiences during menopause. These can affect how your body processes dietary fats, triglycerides, and cholesterol. Heart attack and stroke are both medical emergencies. There are many things that you can do to help prevent heart disease and stroke:  Have your blood pressure  checked at least every 1-2 years. High blood pressure causes heart disease and increases the risk of stroke.  If you are 55-79 years old, ask your health care provider if you should take aspirin to prevent a heart attack or a stroke.  Do not use any tobacco products, including cigarettes, chewing tobacco, or electronic cigarettes. If you need help quitting, ask your health care provider.  It is important to eat a healthy diet and maintain a healthy weight.  Be sure to include plenty of vegetables, fruits, low-fat dairy products, and lean protein.  Avoid eating foods that are high in solid fats, added sugars, or salt (sodium).  Get regular exercise. This is one of the most important things that you can do for your health.  Try to exercise for at least 150 minutes each week. The type of exercise that you do should increase your heart rate and make you sweat. This is known as moderate-intensity exercise.  Try to do strengthening exercises at least twice each week. Do these in addition to the moderate-intensity exercise.  Know your numbers.Ask your health care provider to check your cholesterol and your blood glucose. Continue to have your blood tested as directed by your health care provider. WHAT SHOULD I KNOW ABOUT CANCER SCREENING? There are several types of cancer. Take the following steps to reduce your risk and to catch any cancer development as early as possible. Breast Cancer  Practice breast self-awareness.  This means understanding how your breasts normally appear and feel.  It also means doing regular breast self-exams. Let your health care provider know about any changes, no matter how small.  If you are 40 or older, have a clinician do a   breast exam (clinical breast exam or CBE) every year. Depending on your age, family history, and medical history, it may be recommended that you also have a yearly breast X-ray (mammogram).  If you have a family history of breast cancer,  talk with your health care provider about genetic screening.  If you are at high risk for breast cancer, talk with your health care provider about having an MRI and a mammogram every year.  Breast cancer (BRCA) gene test is recommended for women who have family members with BRCA-related cancers. Results of the assessment will determine the need for genetic counseling and BRCA1 and for BRCA2 testing. BRCA-related cancers include these types:  Breast. This occurs in males or females.  Ovarian.  Tubal. This may also be called fallopian tube cancer.  Cancer of the abdominal or pelvic lining (peritoneal cancer).  Prostate.  Pancreatic. Cervical, Uterine, and Ovarian Cancer Your health care provider may recommend that you be screened regularly for cancer of the pelvic organs. These include your ovaries, uterus, and vagina. This screening involves a pelvic exam, which includes checking for microscopic changes to the surface of your cervix (Pap test).  For women ages 21-65, health care providers may recommend a pelvic exam and a Pap test every three years. For women ages 85-65, they may recommend the Pap test and pelvic exam, combined with testing for human papilloma virus (HPV), every five years. Some types of HPV increase your risk of cervical cancer. Testing for HPV may also be done on women of any age who have unclear Pap test results.  Other health care providers may not recommend any screening for nonpregnant women who are considered low risk for pelvic cancer and have no symptoms. Ask your health care provider if a screening pelvic exam is right for you.  If you have had past treatment for cervical cancer or a condition that could lead to cancer, you need Pap tests and screening for cancer for at least 20 years after your treatment. If Pap tests have been discontinued for you, your risk factors (such as having a new sexual partner) need to be reassessed to determine if you should start having  screenings again. Some women have medical problems that increase the chance of getting cervical cancer. In these cases, your health care provider may recommend that you have screening and Pap tests more often.  If you have a family history of uterine cancer or ovarian cancer, talk with your health care provider about genetic screening.  If you have vaginal bleeding after reaching menopause, tell your health care provider.  There are currently no reliable tests available to screen for ovarian cancer. Lung Cancer Lung cancer screening is recommended for adults 2-88 years old who are at high risk for lung cancer because of a history of smoking. A yearly low-dose CT scan of the lungs is recommended if you:  Currently smoke.  Have a history of at least 30 pack-years of smoking and you currently smoke or have quit within the past 15 years. A pack-year is smoking an average of one pack of cigarettes per day for one year. Yearly screening should:  Continue until it has been 15 years since you quit.  Stop if you develop a health problem that would prevent you from having lung cancer treatment. Colorectal Cancer  This type of cancer can be detected and can often be prevented.  Routine colorectal cancer screening usually begins at age 39 and continues through age 64.  If you have  risk factors for colon cancer, your health care provider may recommend that you be screened at an earlier age.  If you have a family history of colorectal cancer, talk with your health care provider about genetic screening.  Your health care provider may also recommend using home test kits to check for hidden blood in your stool.  A small camera at the end of a tube can be used to examine your colon directly (sigmoidoscopy or colonoscopy). This is done to check for the earliest forms of colorectal cancer.  Direct examination of the colon should be repeated every 5-10 years until age 67. However, if early forms of  precancerous polyps or small growths are found or if you have a family history or genetic risk for colorectal cancer, you may need to be screened more often. Skin Cancer  Check your skin from head to toe regularly.  Monitor any moles. Be sure to tell your health care provider:  About any new moles or changes in moles, especially if there is a change in a mole's shape or color.  If you have a mole that is larger than the size of a pencil eraser.  If any of your family members has a history of skin cancer, especially at a young age, talk with your health care provider about genetic screening.  Always use sunscreen. Apply sunscreen liberally and repeatedly throughout the day.  Whenever you are outside, protect yourself by wearing long sleeves, pants, a wide-brimmed hat, and sunglasses. WHAT SHOULD I KNOW ABOUT OSTEOPOROSIS? Osteoporosis is a condition in which bone destruction happens more quickly than new bone creation. After menopause, you may be at an increased risk for osteoporosis. To help prevent osteoporosis or the bone fractures that can happen because of osteoporosis, the following is recommended:  If you are 39-61 years old, get at least 1,000 mg of calcium and at least 600 mg of vitamin D per day.  If you are older than age 16 but younger than age 7, get at least 1,200 mg of calcium and at least 600 mg of vitamin D per day.  If you are older than age 47, get at least 1,200 mg of calcium and at least 800 mg of vitamin D per day. Smoking and excessive alcohol intake increase the risk of osteoporosis. Eat foods that are rich in calcium and vitamin D, and do weight-bearing exercises several times each week as directed by your health care provider. WHAT SHOULD I KNOW ABOUT HOW MENOPAUSE AFFECTS Savannah Cook? Depression may occur at any age, but it is more common as you become older. Common symptoms of depression include:  Low or sad mood.  Changes in sleep patterns.  Changes  in appetite or eating patterns.  Feeling an overall lack of motivation or enjoyment of activities that you previously enjoyed.  Frequent crying spells. Talk with your health care provider if you think that you are experiencing depression. WHAT SHOULD I KNOW ABOUT IMMUNIZATIONS? It is important that you get and maintain your immunizations. These include:  Tetanus, diphtheria, and pertussis (Tdap) booster vaccine.  Influenza every year before the flu season begins.  Pneumonia vaccine.  Shingles vaccine. Your health care provider may also recommend other immunizations.   This information is not intended to replace advice given to you by your health care provider. Make sure you discuss any questions you have with your health care provider.   Document Released: 01/25/2006 Document Revised: 12/24/2014 Document Reviewed: 08/05/2014 Elsevier Interactive Patient Education 2016 Elsevier  Inc. Basic Carbohydrate Counting for Diabetes Mellitus Carbohydrate counting is a method for keeping track of the amount of carbohydrates you eat. Eating carbohydrates naturally increases the level of sugar (glucose) in your blood, so it is important for you to know the amount that is okay for you to have in every meal. Carbohydrate counting helps keep the level of glucose in your blood within normal limits. The amount of carbohydrates allowed is different for every person. A dietitian can help you calculate the amount that is right for you. Once you know the amount of carbohydrates you can have, you can count the carbohydrates in the foods you want to eat. Carbohydrates are found in the following foods:  Grains, such as breads and cereals.  Dried beans and soy products.  Starchy vegetables, such as potatoes, peas, and corn.  Fruit and fruit juices.  Milk and yogurt.  Sweets and snack foods, such as cake, cookies, candy, chips, soft drinks, and fruit drinks. CARBOHYDRATE COUNTING There are two ways to  count the carbohydrates in your food. You can use either of the methods or a combination of both. Reading the "Nutrition Facts" on [place] The "Nutrition Facts" is an area that is included on the labels of almost all packaged food and beverages in the [place]. It includes the serving size of that food or beverage and information about the nutrients in each serving of the food, including the grams (g) of carbohydrate per serving.  Decide the number of servings of this food or beverage that you will be able to eat or drink. Multiply that number of servings by the number of grams of carbohydrate that is listed on the label for that serving. The total will be the amount of carbohydrates you will be having when you eat or drink this food or beverage. Learning Standard Serving Sizes of Food When you eat food that is not packaged or does not include "Nutrition Facts" on the label, you need to measure the servings in order to count the amount of carbohydrates.A serving of most carbohydrate-rich foods contains about 15 g of carbohydrates. The following list includes serving sizes of carbohydrate-rich foods that provide 15 g ofcarbohydrate per serving:   1 slice of bread (1 oz) or 1 six-inch tortilla.    of a hamburger bun or English muffin.  4-6 crackers.   cup unsweetened dry cereal.    cup hot cereal.   cup rice or pasta.    cup mashed potatoes or  of a large baked potato.  1 cup fresh fruit or one small piece of fruit.    cup canned or frozen fruit or fruit juice.  1 cup milk.   cup plain fat-free yogurt or yogurt sweetened with artificial sweeteners.   cup cooked dried beans or starchy vegetable, such as peas, corn, or potatoes.  Decide the number of standard-size servings that you will eat. Multiply that number of servings by 15 (the grams of carbohydrates in that serving). For example, if you eat 2 cups of strawberries, you will have eaten 2 servings and 30 g of  carbohydrates (2 servings x 15 g = 30 g). For foods such as soups and casseroles, in which more than one food is mixed in, you will need to count the carbohydrates in each food that is included. EXAMPLE OF CARBOHYDRATE COUNTING Sample Dinner  3 oz chicken breast.   cup of brown rice.   cup of corn.  1 cup milk.   1 cup  strawberries with sugar-free whipped topping.  Carbohydrate Calculation Step 1: Identify the foods that contain carbohydrates:   Rice.   Corn.   Milk.   Strawberries. Step 2:Calculate the number of servings eaten of each:   2 servings of rice.   1 serving of corn.   1 serving of milk.   1 serving of strawberries. Step 3: Multiply each of those number of servings by 15 g:   2 servings of rice x 15 g = 30 g.   1 serving of corn x 15 g = 15 g.   1 serving of milk x 15 g = 15 g.   1 serving of strawberries x 15 g = 15 g. Step 4: Add together all of the amounts to find the total grams of carbohydrates eaten: 30 g + 15 g + 15 g + 15 g = 75 g.   This information is not intended to replace advice given to you by your health care provider. Make sure you discuss any questions you have with your health care provider.   Document Released: 12/03/2005 Document Revised: 12/24/2014 Document Reviewed: 10/30/2013 Elsevier Interactive Patient Education [employer].

## 2016-06-13 NOTE — Progress Notes (Signed)
Savannah Cook 09-17-1966 [id number]    History:    Presents for annual exam.  Continues to have a light monthly cycle occasional menopausal symptoms. Normal Pap and mammogram history. 2008 ASCUS with negative HR HPV. Colonoscopy scheduled for August.  Past medical history, past surgical history, family history and social history were all reviewed and documented in the EPIC chart. 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 Engaged wedding date not set. Mother diabetes, COPD and bipolar deceased. Father diabetes and died of old age.  ROS:  A ROS was performed and pertinent positives and negatives are included.  Exam:  Filed Vitals:   06/13/16 0904  BP: 122/80    General appearance:  Normal Thyroid:  Symmetrical, normal in size, without palpable masses or nodularity. Respiratory  Auscultation:  Clear without wheezing or rhonchi Cardiovascular  Auscultation:  Regular rate, without rubs, murmurs or gallops  Edema/varicosities:  Not grossly evident Abdominal  Soft,nontender, without masses, guarding or rebound.  Liver/spleen:  No organomegaly noted  Hernia:  None appreciated  Skin  Inspection:  Grossly normal   Breasts: Examined lying and sitting.     Right: Without masses, retractions, discharge or axillary adenopathy.     Left: Without masses, retractions, discharge or axillary adenopathy. Gentitourinary   Inguinal/mons:  Normal without inguinal adenopathy  External genitalia:  Normal  BUS/Urethra/Skene's glands:  Normal  Vagina:  Normal Wet prep positive for few clue cells  Cervix:  Normal  Uterus:   normal in size, shape and contour.  Midline and mobile  Adnexa/parametria:     Rt: Without masses or tenderness.   Lt: Without masses or tenderness.  Anus and perineum: Normal  Digital rectal exam: Normal sphincter tone without palpated masses or tenderness  Assessment/Plan:  50 y.o. S WF G1 P1 for annual exam with increased urinary  frequency.  Monthly cycle/condoms Bacteria vaginosis Obesity  Plan: Flagyl 500 twice daily for 7 days, alcohol precautions reviewed. Call if no relief. Urine negative, Urine culture pending. SBE's, continue annual screening mammogram, increase regular exercise and decrease calories for weight loss. Has an appointment with a nutritionist. CBC, CMP, lipid panel, hemoglobin A1c, UA, Pap normal 2016, new screening guidelines reviewed.   Huel Cote [facility], 10:36 AM 06/13/2016

## 2016-06-14 ENCOUNTER — Telehealth: Payer: Self-pay

## 2016-06-14 LAB — HEMOGLOBIN A1C
Hgb A1c MFr Bld: 5.7 % — ABNORMAL HIGH (ref <5.7)
MEAN PLASMA GLUCOSE: 117 mg/dL

## 2016-06-14 LAB — URINE CULTURE
COLONY COUNT: NO GROWTH
Organism ID, Bacteria: NO GROWTH

## 2016-06-14 LAB — VITAMIN D 25 HYDROXY (VIT D DEFICIENCY, FRACTURES): VIT D 25 HYDROXY: 38 ng/mL (ref 30–100)

## 2016-06-14 NOTE — Telephone Encounter (Signed)
-----   Message from Huel Cote, NP sent at 06/14/2016  7:27 AM EDT ----- Please call and review vitamin D is in the normal range, low end -  take over-the-counter 1000 IUs daily. Hemoglobin A1c slightly elevated at the prediabetes range need to decrease simple sugars, increase regular exercise, reduce carbs in diet  Eat more vegetables. Lipid panel is better. Hemoglobin and hematocrit normal no longer anemic

## 2016-06-14 NOTE — Telephone Encounter (Addendum)
Patient asked if since her hgb is normal now does she have to continue taking iron. She commented that she would like to return to having normal BM's.  Patient said in the past you have prescribed antibiotic cream for boils she gets in vaginal area. She forgot to ask at visit if you would send that Rx in for her to have on hand. (Clindamycin lotion?)

## 2016-06-14 NOTE — Telephone Encounter (Signed)
She can stop taking iron but continue multivitamin daily, continue to  be sure to get plenty of foods with iron such as dark green leafy vegetables. Was clindamycin lotion what she had in the past? Or she could use over-the-counter Neosporin as needed,  if she is clindamycin lotion in the past that is okay to call in.

## 2016-06-15 NOTE — Telephone Encounter (Signed)
I tried to call her back but mail box is full and I cannot leave a message.

## 2016-06-20 MED ORDER — CLINDAMYCIN PHOSPHATE 2 % VA CREA: TOPICAL_CREAM | VAGINAL | Status: DC

## 2016-06-20 NOTE — Telephone Encounter (Addendum)
Patient called back. Informed. She wants the Clindamycin cream. She does not want lotion as was in historical meds. Rx sent.

## 2016-06-20 NOTE — Addendum Note (Signed)
Addended by: Ramond Craver on: 06/20/2016 02:35 PM   Modules accepted: Orders

## 2016-06-22 ENCOUNTER — Telehealth: Payer: Self-pay

## 2016-06-22 ENCOUNTER — Other Ambulatory Visit: Payer: Self-pay | Admitting: Women's Health

## 2016-06-22 MED ORDER — CLINDAMYCIN PHOSPHATE 1 % EX LOTN: TOPICAL_LOTION | Freq: Two times a day (BID) | CUTANEOUS | Status: DC

## 2016-06-22 NOTE — Telephone Encounter (Signed)
Patient called because she will be seeing a doctor on Monday and would like her recent labs to send to him. She was instructed step by step over the phone and printed out a release form. She will complete and sign and fax to me and I will fax her labs back to her.  I spoke with patient about the benefits of My Chart but she declined any information stating she only comes here once a year and she is not on the computer very much.

## 2016-06-22 NOTE — Telephone Encounter (Signed)
Thank you for handling! 

## 2016-06-22 NOTE — Telephone Encounter (Signed)
Patient picked up Rx and said it was not what she wanted. She said she had told me Clindamycin cream and that is what she has but it is vaginal cream. She said what she had before was for topical use. I resent the Rx with the generic Cleocin T lotion for topical use that is in her med history.   I spoke with patient and let her know. I apologized for the confusion.

## 2016-06-25 ENCOUNTER — Telehealth: Payer: Self-pay | Admitting: Women's Health

## 2016-06-25 NOTE — Telephone Encounter (Signed)
This note was entered by mistake. °

## 2016-09-13 DIAGNOSIS — L814 Other melanin hyperpigmentation: Secondary | ICD-10-CM | POA: Diagnosis not present

## 2016-09-13 DIAGNOSIS — D225 Melanocytic nevi of trunk: Secondary | ICD-10-CM | POA: Diagnosis not present

## 2016-09-13 DIAGNOSIS — Z23 Encounter for immunization: Secondary | ICD-10-CM | POA: Diagnosis not present

## 2016-09-13 DIAGNOSIS — L821 Other seborrheic keratosis: Secondary | ICD-10-CM | POA: Diagnosis not present

## 2016-09-13 DIAGNOSIS — D1801 Hemangioma of skin and subcutaneous tissue: Secondary | ICD-10-CM | POA: Diagnosis not present

## 2016-09-13 DIAGNOSIS — D2371 Other benign neoplasm of skin of right lower limb, including hip: Secondary | ICD-10-CM | POA: Diagnosis not present

## 2016-10-09 ENCOUNTER — Telehealth: Payer: Self-pay | Admitting: *Deleted

## 2016-10-09 NOTE — Telephone Encounter (Signed)
Patient asked if you would call her to speak to you about a hemopathy drop that's placed sublingually called "HA2CG" helps with weight loss, pt has lost 25 pounds. She asked if you would call her tomorrow anytime at [phone]. Please advise

## 2016-10-10 NOTE — Telephone Encounter (Signed)
Message left

## 2016-10-11 NOTE — Telephone Encounter (Signed)
TC  -  no we can not order.  Keep up good healthy lifestyle.

## 2016-10-30 ENCOUNTER — Encounter: Payer: Self-pay | Admitting: Women's Health

## 2016-10-30 ENCOUNTER — Ambulatory Visit (INDEPENDENT_AMBULATORY_CARE_PROVIDER_SITE_OTHER): Payer: BLUE CROSS/BLUE SHIELD | Admitting: Women's Health

## 2016-10-30 VITALS — BP 126/84

## 2016-10-30 DIAGNOSIS — R350 Frequency of micturition: Secondary | ICD-10-CM

## 2016-10-30 DIAGNOSIS — L659 Nonscarring hair loss, unspecified: Secondary | ICD-10-CM

## 2016-10-30 DIAGNOSIS — D508 Other iron deficiency anemias: Secondary | ICD-10-CM | POA: Diagnosis not present

## 2016-10-30 DIAGNOSIS — N898 Other specified noninflammatory disorders of vagina: Secondary | ICD-10-CM

## 2016-10-30 LAB — CBC WITH DIFFERENTIAL/PLATELET
Basophils Absolute: 0 cells/uL (ref 0–200)
Basophils Relative: 0 %
Eosinophils Absolute: 292 cells/uL (ref 15–500)
Eosinophils Relative: 4 %
HEMATOCRIT: 34.5 % — AB (ref 35.0–45.0)
Hemoglobin: 10.7 g/dL — ABNORMAL LOW (ref 11.7–15.5)
LYMPHS PCT: 34 %
Lymphs Abs: 2482 cells/uL (ref 850–3900)
MCH: 23.4 pg — ABNORMAL LOW (ref 27.0–33.0)
MCHC: 31 g/dL — AB (ref 32.0–36.0)
MCV: 75.5 fL — ABNORMAL LOW (ref 80.0–100.0)
MONO ABS: 657 {cells}/uL (ref 200–950)
MONOS PCT: 9 %
MPV: 9.4 fL (ref 7.5–12.5)
NEUTROS PCT: 53 %
Neutro Abs: 3869 cells/uL (ref 1500–7800)
PLATELETS: 394 10*3/uL (ref 140–400)
RBC: 4.57 MIL/uL (ref 3.80–5.10)
RDW: 16.7 % — AB (ref 11.0–15.0)
WBC: 7.3 10*3/uL (ref 3.8–10.8)

## 2016-10-30 LAB — WET PREP FOR TRICH, YEAST, CLUE
Clue Cells Wet Prep HPF POC: NONE SEEN
TRICH WET PREP: NONE SEEN
Yeast Wet Prep HPF POC: NONE SEEN

## 2016-10-30 LAB — URINALYSIS W MICROSCOPIC + REFLEX CULTURE
BILIRUBIN URINE: NEGATIVE
CASTS: NONE SEEN [LPF]
CRYSTALS: NONE SEEN [HPF]
GLUCOSE, UA: NEGATIVE
HGB URINE DIPSTICK: NEGATIVE
KETONES UR: NEGATIVE
Nitrite: NEGATIVE
PROTEIN: NEGATIVE
RBC / HPF: NONE SEEN RBC/HPF (ref <=2)
Specific Gravity, Urine: 1.02 (ref 1.001–1.035)
Yeast: NONE SEEN [HPF]
pH: 5 (ref 5.0–8.0)

## 2016-10-30 LAB — TSH: TSH: 3.57 mIU/L

## 2016-10-30 MED ORDER — FLUCONAZOLE 150 MG PO TABS: 150.0000 mg | ORAL_TABLET | Freq: Once | ORAL | 1 refills | Status: AC

## 2016-10-30 NOTE — Progress Notes (Signed)
Presents for evaluation of vaginal itching, external irritation and possible anemia. Denies discharge, odor, urinary symptoms, abdominal pain, or fever. States that it started a few days ago. Fiance with BPH and subsequent UTI's. Concerned that she could have gotten an infection from him. Concerned that it is BV again so took remaining fluconazole prescription yesterday. Also concerned about anemia. States that she stopped taking iron pills in July. Realized last night her symptoms: restless leg, brittle nails, and hair falling out could be from returning anemia. Currently taking over-the-counter multiple vitamin, folic acid, biotin  Exam: Appears well. External genitalia erythematous. Speculum exam: cervix within normal limits, healthy discharge. Bimanual: no CMT or adnexal tenderness  Wet prep: negative.          UA: Negative nitrites, trace leukocytes, 6-10 WBCs, few bacteria, no RBCs  Vaginitis Iron deficiency anemia Obesity  Plan: UA culture pending. CBC. Encouraged exercise, weight loss and healthy eating. Prenatal vitamin encouraged. Instructed to use A and D ointment on pruritic areas, loose clothing. Encouraged dark green leafy vegetables, calcium rich foods, multivitamin with iron in it daily.

## 2016-10-30 NOTE — Patient Instructions (Signed)
Anemia, Nonspecific Anemia is a condition in which the concentration of red blood cells or hemoglobin in the blood is below normal. Hemoglobin is a substance in red blood cells that carries oxygen to the tissues of the body. Anemia results in not enough oxygen reaching these tissues. What are the causes? Common causes of anemia include:  Excessive bleeding. Bleeding may be internal or external. This includes excessive bleeding from periods (in women) or from the intestine.  Poor nutrition.  Chronic kidney, thyroid, and liver disease.  Bone marrow disorders that decrease red blood cell production.  Cancer and treatments for cancer.  HIV, AIDS, and their treatments.  Spleen problems that increase red blood cell destruction.  Blood disorders.  Excess destruction of red blood cells due to infection, medicines, and autoimmune disorders. What are the signs or symptoms?  Minor weakness.  Dizziness.  Headache.  Palpitations.  Shortness of breath, especially with exercise.  Paleness.  Cold sensitivity.  Indigestion.  Nausea.  Difficulty sleeping.  Difficulty concentrating. Symptoms may occur suddenly or they may develop slowly. How is this diagnosed? Additional blood tests are often needed. These help your health care provider determine the best treatment. Your health care provider will check your stool for blood and look for other causes of blood loss. How is this treated? Treatment varies depending on the cause of the anemia. Treatment can include:  Supplements of iron, vitamin B12, or folic acid.  Hormone medicines.  A blood transfusion. This may be needed if blood loss is severe.  Hospitalization. This may be needed if there is significant continual blood loss.  Dietary changes.  Spleen removal. Follow these instructions at home: Keep all follow-up appointments. It often takes many weeks to correct anemia, and having your health care provider check on your  condition and your response to treatment is very important. Get help right away if:  You develop extreme weakness, shortness of breath, or chest pain.  You become dizzy or have trouble concentrating.  You develop heavy vaginal bleeding.  You develop a rash.  You have bloody or black, tarry stools.  You faint.  You vomit up blood.  You vomit repeatedly.  You have abdominal pain.  You have a fever or persistent symptoms for more than 2-3 days.  You have a fever and your symptoms suddenly get worse.  You are dehydrated. This information is not intended to replace advice given to you by your health care provider. Make sure you discuss any questions you have with your health care provider. Document Released: 01/10/2005 Document Revised: 05/16/2016 Document Reviewed: 05/29/2013 Elsevier Interactive Patient Education  2017 Elsevier Inc.  

## 2016-10-31 ENCOUNTER — Other Ambulatory Visit: Payer: Self-pay | Admitting: Women's Health

## 2016-10-31 DIAGNOSIS — D500 Iron deficiency anemia secondary to blood loss (chronic): Secondary | ICD-10-CM

## 2016-10-31 LAB — URINE CULTURE: ORGANISM ID, BACTERIA: NO GROWTH

## 2017-01-18 DIAGNOSIS — D509 Iron deficiency anemia, unspecified: Secondary | ICD-10-CM | POA: Insufficient documentation

## 2017-01-18 DIAGNOSIS — L659 Nonscarring hair loss, unspecified: Secondary | ICD-10-CM | POA: Diagnosis not present

## 2017-01-24 DIAGNOSIS — R7989 Other specified abnormal findings of blood chemistry: Secondary | ICD-10-CM | POA: Diagnosis not present

## 2017-01-24 DIAGNOSIS — R899 Unspecified abnormal finding in specimens from other organs, systems and tissues: Secondary | ICD-10-CM | POA: Diagnosis not present

## 2017-02-05 ENCOUNTER — Encounter: Payer: Self-pay | Admitting: Internal Medicine

## 2017-03-28 ENCOUNTER — Encounter (INDEPENDENT_AMBULATORY_CARE_PROVIDER_SITE_OTHER): Payer: Self-pay

## 2017-03-28 ENCOUNTER — Encounter: Payer: Self-pay | Admitting: Internal Medicine

## 2017-03-28 ENCOUNTER — Other Ambulatory Visit (INDEPENDENT_AMBULATORY_CARE_PROVIDER_SITE_OTHER): Payer: BLUE CROSS/BLUE SHIELD

## 2017-03-28 ENCOUNTER — Ambulatory Visit (INDEPENDENT_AMBULATORY_CARE_PROVIDER_SITE_OTHER): Payer: BLUE CROSS/BLUE SHIELD | Admitting: Internal Medicine

## 2017-03-28 VITALS — BP 110/70 | HR 80 | Ht 61.5 in | Wt 183.1 lb

## 2017-03-28 DIAGNOSIS — R195 Other fecal abnormalities: Secondary | ICD-10-CM

## 2017-03-28 DIAGNOSIS — D500 Iron deficiency anemia secondary to blood loss (chronic): Secondary | ICD-10-CM

## 2017-03-28 DIAGNOSIS — D508 Other iron deficiency anemias: Secondary | ICD-10-CM | POA: Diagnosis not present

## 2017-03-28 DIAGNOSIS — R140 Abdominal distension (gaseous): Secondary | ICD-10-CM

## 2017-03-28 DIAGNOSIS — R1013 Epigastric pain: Secondary | ICD-10-CM

## 2017-03-28 LAB — CBC WITH DIFFERENTIAL/PLATELET
BASOS ABS: 0.1 10*3/uL (ref 0.0–0.1)
BASOS PCT: 0.7 % (ref 0.0–3.0)
EOS ABS: 0.4 10*3/uL (ref 0.0–0.7)
Eosinophils Relative: 4.2 % (ref 0.0–5.0)
HCT: 37.7 % (ref 36.0–46.0)
HEMOGLOBIN: 12.1 g/dL (ref 12.0–15.0)
LYMPHS PCT: 31.7 % (ref 12.0–46.0)
Lymphs Abs: 2.7 10*3/uL (ref 0.7–4.0)
MCHC: 32.1 g/dL (ref 30.0–36.0)
MCV: 77 fl — ABNORMAL LOW (ref 78.0–100.0)
MONO ABS: 0.7 10*3/uL (ref 0.1–1.0)
Monocytes Relative: 8.7 % (ref 3.0–12.0)
Neutro Abs: 4.6 10*3/uL (ref 1.4–7.7)
Neutrophils Relative %: 54.7 % (ref 43.0–77.0)
PLATELETS: 374 10*3/uL (ref 150.0–400.0)
RBC: 4.89 Mil/uL (ref 3.87–5.11)
RDW: 16.2 % — AB (ref 11.5–15.5)
WBC: 8.4 10*3/uL (ref 4.0–10.5)

## 2017-03-28 LAB — IGA: IGA: 422 mg/dL — AB (ref 68–378)

## 2017-03-28 NOTE — Progress Notes (Signed)
Savannah Cook 51 y.o. 15-Sep-1966 [id number] Referred by: Huel Cote, NP Dr. Adline Mango Assessment & Plan:   Encounter Diagnoses  Name Primary?  . Other iron deficiency anemia Yes  . Heme + stool   . Dyspepsia   . Bloating    Is appropriate to evaluate the heme positive stool and iron deficiency anemia with an upper endoscopy and colonoscopy. It sounds like she may have IBS, a FODMAPS diet will be tried. I will screen her for celiac disease with serology also.  The risks and benefits as well as alternatives of endoscopic procedure(s) have been discussed and reviewed. All questions answered. The patient agrees to proceed.  I appreciate the opportunity to care for this patient. CC: Huel Cote, NP Dr. Adline Mango  Subjective:   Chief Complaint: Positive stool. Hiatal hernia.  HPI The patient is a very nice 51 year old engaged white woman here because of a heme positive stool, and digestive system issues. She has intermittent heartburn and belching issues. She has a fair amount of bloating and gas despite probiotics, has a left upper quadrant pain with some intermittent cramping. Her hair is dry and brittle. Nails are growing and a small amount of hair loss is noted as well. She wonders if she has vitamin deficiencies. She was found to be iron deficient one or two-year's ago and took iron and hemoglobin improved. Still on a multivitamin overall. Not on iron supplementation anymore. She still menstruates. Periods are not particular heavy. Recently saw primary care and had a heme positive stool test guaiac-based. February 2018 hemoglobin was 12 on February 8 MCV 77 RDW 19%. Curious if that has changed.  Last summer she saw a chiropractor, Dr. Micheline Chapman, and he evaluated her, she wanted to lose weight so she was placed on a doctor supervised food management plan, he lost 20+ pounds on this diet which was gluten-free, and all program cost $1300. He was also given supplements that included  hypothalamus P MG 8 clear light P S.F[employer] The Plavix and a women's multivitamin. She said that he diagnosed her with a hiatal hernia by examining her and taught her some positional postural manipulations to help with that. She does use Pepcid when necessary which helped somewhat. She did feel better with the weight loss overall. She is interested in pursuing a diet again to help with the bloating etc. as that diet she was on did seem to help.  Allergies  Allergen Reactions  . Other     SEASONAL  . Ceclor [Cefaclor] Rash   Current Meds  Medication Sig  . AMBULATORY NON FORMULARY MEDICATION Hypothalamus Pmg Take 1 tablet by mouth twice daily  . AMBULATORY NON FORMULARY MEDICATION Cataplex C Take 1 tablet by mouth twice daily  . AMBULATORY NON FORMULARY MEDICATION Clearvite shake As needed  . Biotin 10000 MCG TABS Take 1 tablet by mouth daily.  . Cholecalciferol (VITAMIN D3) 2000 units capsule Take 2,000 Units by mouth daily.  Marland Kitchen ELDERBERRY PO Take 1 tablet by mouth daily.  . famotidine (PEPCID) 20 MG tablet Take 20 mg by mouth as needed for heartburn or indigestion.  . fluticasone (FLONASE) 50 MCG/ACT nasal spray Place into both nostrils as needed.   . magnesium gluconate (MAGONATE) 500 MG tablet Take 500 mg by mouth daily.  . Prenatal Vit-Fe Fumarate-FA (PRENATAL FORMULA PO) Take 1 tablet by mouth daily.  . [DISCONTINUED] Cholecalciferol (VITAMIN D-3) 1000 units CAPS Take 2 capsules by mouth daily.   . [DISCONTINUED] Cyanocobalamin (  VITAMIN B 12 PO) Take 1 tablet by mouth daily. W/PROBIOTIC   Past Medical History:  Diagnosis Date  . Anxiety   . Seasonal allergies    Past Surgical History:  Procedure Laterality Date  . WISDOM TOOTH EXTRACTION    . WRIST SURGERY Right 04/2012   Social History   Social History  . Marital status: Significant Other    Spouse name: N/A  . Number of children: 1  . Years of education: N/A   Occupational History  . Publishing copy    Social History  Main Topics  . Smoking status: Former Smoker    Quit date: 12/17/1985  . Smokeless tobacco: Never Used  . Alcohol use 0.0 oz/week     Comment: RARELY  . Drug use: No  . Sexual activity: No     Comment: INTERCOURSE AGE UNKOWN , SEXUAL PARTNERS LESS THAN 5   Other Topics Concern  . None   Social History Narrative  . None   family history includes Bipolar disorder in her mother; COPD in her mother; Colon cancer (age of onset: 43) in her maternal grandfather; Diabetes in her father and mother; Heart disease in her maternal grandmother; Hypertension in her father and mother; Lung cancer in her father.  Review of Systems As per history of present illness. Seasonal allergies. All other review of systems negative.  Objective:   Physical Exam @BP  110/70 (BP Location: Left Arm, Patient Position: Sitting, Cuff Size: Normal)   Pulse 80   Ht 5' 1.5" (1.562 m) Comment: height measured without shoes  Wt 183 lb 2 oz (83.1 kg)   LMP 03/03/2017   BMI 34.04 kg/m @  General:  Well-developed, well-nourished and in no acute distress - obese Eyes:  anicteric. ENT:   Mouth and posterior pharynx free of lesions.  Neck:   supple w/o thyromegaly or mass.  Lungs: Clear to auscultation bilaterally. Heart:  S1S2, no rubs, murmurs, gallops. Abdomen:  soft, non-tender, no hepatosplenomegaly, hernia, or mass and BS+.  Rectal: Deferred until colonoscop Lymph:  no cervical or supraclavicular adenopathy. Extremities:   no edema, cyanosis or clubbing Skin   no rash. Neuro:  A&O x 3.  Psych:  appropriate mood and  Affect.   Data Reviewed: Weight loss management records GYN visit records PCP records/labs Dr. Adline Mango in care everywhere

## 2017-03-28 NOTE — Patient Instructions (Addendum)
You have been scheduled for a colonoscopy. Please follow written instructions given to you at your visit today.  Please pick up your prep supplies at the pharmacy. If you use inhalers (even only as needed), please bring them with you on the day of your procedure. Your physician has requested that you go to www.startemmi.com and enter the access code given to you at your visit today. This web site gives a general overview about your procedure. However, you should still follow specific instructions given to you by our office regarding your preparation for the procedure.   Your physician has requested that you go to the basement for the following lab work before leaving today: TTG, IGA, CBC/diff   Stay on your iron for now and we are going to wait to repeat CBC.   Today we are giving you a FODMAP today to read and follow.    I appreciate the opportunity to care for you. Ronney Lion, [facility]

## 2017-03-29 LAB — CBC WITH DIFFERENTIAL/PLATELET

## 2017-03-29 LAB — TISSUE TRANSGLUTAMINASE, IGA: TISSUE TRANSGLUTAMINASE AB, IGA: 1 U/mL (ref <4)

## 2017-04-02 ENCOUNTER — Encounter: Payer: Self-pay | Admitting: Internal Medicine

## 2017-04-04 DIAGNOSIS — M542 Cervicalgia: Secondary | ICD-10-CM | POA: Diagnosis not present

## 2017-04-04 DIAGNOSIS — S46011A Strain of muscle(s) and tendon(s) of the rotator cuff of right shoulder, initial encounter: Secondary | ICD-10-CM | POA: Diagnosis not present

## 2017-04-11 NOTE — Progress Notes (Signed)
My Chart note

## 2017-04-12 DIAGNOSIS — M25511 Pain in right shoulder: Secondary | ICD-10-CM | POA: Diagnosis not present

## 2017-04-15 DIAGNOSIS — S46011D Strain of muscle(s) and tendon(s) of the rotator cuff of right shoulder, subsequent encounter: Secondary | ICD-10-CM | POA: Diagnosis not present

## 2017-04-17 DIAGNOSIS — M6281 Muscle weakness (generalized): Secondary | ICD-10-CM | POA: Diagnosis not present

## 2017-04-17 DIAGNOSIS — S46011D Strain of muscle(s) and tendon(s) of the rotator cuff of right shoulder, subsequent encounter: Secondary | ICD-10-CM | POA: Diagnosis not present

## 2017-04-17 DIAGNOSIS — M25511 Pain in right shoulder: Secondary | ICD-10-CM | POA: Diagnosis not present

## 2017-04-17 DIAGNOSIS — M25611 Stiffness of right shoulder, not elsewhere classified: Secondary | ICD-10-CM | POA: Diagnosis not present

## 2017-04-24 ENCOUNTER — Ambulatory Visit (INDEPENDENT_AMBULATORY_CARE_PROVIDER_SITE_OTHER): Payer: BLUE CROSS/BLUE SHIELD | Admitting: Women's Health

## 2017-04-24 ENCOUNTER — Encounter: Payer: Self-pay | Admitting: Women's Health

## 2017-04-24 VITALS — BP 118/72 | Ht 62.0 in | Wt 179.4 lb

## 2017-04-24 DIAGNOSIS — S46011D Strain of muscle(s) and tendon(s) of the rotator cuff of right shoulder, subsequent encounter: Secondary | ICD-10-CM | POA: Diagnosis not present

## 2017-04-24 DIAGNOSIS — R350 Frequency of micturition: Secondary | ICD-10-CM

## 2017-04-24 DIAGNOSIS — N898 Other specified noninflammatory disorders of vagina: Secondary | ICD-10-CM

## 2017-04-24 DIAGNOSIS — M25511 Pain in right shoulder: Secondary | ICD-10-CM | POA: Diagnosis not present

## 2017-04-24 DIAGNOSIS — M25611 Stiffness of right shoulder, not elsewhere classified: Secondary | ICD-10-CM | POA: Diagnosis not present

## 2017-04-24 DIAGNOSIS — M6281 Muscle weakness (generalized): Secondary | ICD-10-CM | POA: Diagnosis not present

## 2017-04-24 LAB — WET PREP FOR TRICH, YEAST, CLUE
Clue Cells Wet Prep HPF POC: NONE SEEN
TRICH WET PREP: NONE SEEN
YEAST WET PREP: NONE SEEN

## 2017-04-24 LAB — URINALYSIS W MICROSCOPIC + REFLEX CULTURE
BILIRUBIN URINE: NEGATIVE
CRYSTALS: NONE SEEN [HPF]
Casts: NONE SEEN [LPF]
Glucose, UA: NEGATIVE
Hgb urine dipstick: NEGATIVE
Ketones, ur: NEGATIVE
Leukocytes, UA: NEGATIVE
NITRITE: NEGATIVE
PROTEIN: NEGATIVE
RBC / HPF: NONE SEEN RBC/HPF (ref <=2)
SPECIFIC GRAVITY, URINE: 1.02 (ref 1.001–1.035)
Yeast: NONE SEEN [HPF]
pH: 6.5 (ref 5.0–8.0)

## 2017-04-24 NOTE — Progress Notes (Signed)
Presents with complaint of mild vaginal itching for the past 4 days, minimal discharge. Also states has had some increased urinary frequency without pain or burning. Ellene Route has prostate issues causing frequent urinary tract infections which he is currently under treatment for UTI. Rare intercourse due to his enlarged prostate issues, had intercourse  recently and symptoms started after. Monthly cycle. Recently fell at [employer] hit right shoulder and side has had physical therapy, symptoms are better.  Exam: Appears well. No CVAT. Abdomen soft without rebound or radiation of pain. External genitalia minimal erythema, speculum exam no visible discharge or erythema, wet prep negative. Bimanual no CMT or adnexal tenderness. UA: Negative leukocytes, negative nitrites, 0-5 WBCs, few bacteria.  Urinary frequency Mild vaginal itching  Plan: Encouraged loose clothing, urine culture pending. Reviewed no treatment needed at this time.

## 2017-04-25 LAB — URINE CULTURE

## 2017-04-26 ENCOUNTER — Encounter: Payer: Self-pay | Admitting: Women's Health

## 2017-05-01 ENCOUNTER — Encounter: Payer: Self-pay | Admitting: Gynecology

## 2017-05-29 ENCOUNTER — Encounter: Payer: Self-pay | Admitting: Internal Medicine

## 2017-06-04 ENCOUNTER — Telehealth: Payer: Self-pay | Admitting: Internal Medicine

## 2017-06-04 NOTE — Telephone Encounter (Signed)
Left message for patient to call back  

## 2017-06-04 NOTE — Telephone Encounter (Signed)
Patient notified that endo and colon are being done for iron def anemia and heme positive stool. She will keep the appts for next week.  She will call back for any additional questions or concerns.

## 2017-06-04 NOTE — Telephone Encounter (Signed)
Patient called back states she spoke with insurance and has met most of her deductible and would like to proceed with EGD and Colonoscopy states she would still like to speak with Sheri.  °

## 2017-06-10 ENCOUNTER — Other Ambulatory Visit (INDEPENDENT_AMBULATORY_CARE_PROVIDER_SITE_OTHER): Payer: BLUE CROSS/BLUE SHIELD

## 2017-06-10 ENCOUNTER — Telehealth: Payer: Self-pay

## 2017-06-10 ENCOUNTER — Ambulatory Visit (AMBULATORY_SURGERY_CENTER): Payer: BLUE CROSS/BLUE SHIELD | Admitting: Internal Medicine

## 2017-06-10 ENCOUNTER — Encounter: Payer: Self-pay | Admitting: Internal Medicine

## 2017-06-10 VITALS — BP 106/63 | HR 80 | Temp 98.9°F | Resp 17 | Ht 61.0 in | Wt 183.0 lb

## 2017-06-10 DIAGNOSIS — C189 Malignant neoplasm of colon, unspecified: Secondary | ICD-10-CM

## 2017-06-10 DIAGNOSIS — R195 Other fecal abnormalities: Secondary | ICD-10-CM

## 2017-06-10 DIAGNOSIS — D509 Iron deficiency anemia, unspecified: Secondary | ICD-10-CM | POA: Diagnosis not present

## 2017-06-10 DIAGNOSIS — D508 Other iron deficiency anemias: Secondary | ICD-10-CM

## 2017-06-10 DIAGNOSIS — K317 Polyp of stomach and duodenum: Secondary | ICD-10-CM

## 2017-06-10 DIAGNOSIS — C182 Malignant neoplasm of ascending colon: Secondary | ICD-10-CM

## 2017-06-10 HISTORY — DX: Malignant neoplasm of colon, unspecified: C18.9

## 2017-06-10 LAB — CBC WITH DIFFERENTIAL/PLATELET
BASOS ABS: 0.1 10*3/uL (ref 0.0–0.1)
Basophils Relative: 0.9 % (ref 0.0–3.0)
Eosinophils Absolute: 0.2 10*3/uL (ref 0.0–0.7)
Eosinophils Relative: 2 % (ref 0.0–5.0)
HEMATOCRIT: 34.6 % — AB (ref 36.0–46.0)
Hemoglobin: 11.2 g/dL — ABNORMAL LOW (ref 12.0–15.0)
LYMPHS ABS: 1.5 10*3/uL (ref 0.7–4.0)
LYMPHS PCT: 19.5 % (ref 12.0–46.0)
MCHC: 32.3 g/dL (ref 30.0–36.0)
MCV: 78.4 fl (ref 78.0–100.0)
MONOS PCT: 7.8 % (ref 3.0–12.0)
Monocytes Absolute: 0.6 10*3/uL (ref 0.1–1.0)
NEUTROS PCT: 69.8 % (ref 43.0–77.0)
Neutro Abs: 5.5 10*3/uL (ref 1.4–7.7)
Platelets: 369 10*3/uL (ref 150.0–400.0)
RBC: 4.41 Mil/uL (ref 3.87–5.11)
RDW: 18.6 % — ABNORMAL HIGH (ref 11.5–15.5)
WBC: 7.9 10*3/uL (ref 4.0–10.5)

## 2017-06-10 LAB — COMPREHENSIVE METABOLIC PANEL
ALBUMIN: 3.9 g/dL (ref 3.5–5.2)
ALT: 13 U/L (ref 0–35)
AST: 14 U/L (ref 0–37)
Alkaline Phosphatase: 70 U/L (ref 39–117)
BUN: 9 mg/dL (ref 6–23)
CHLORIDE: 105 meq/L (ref 96–112)
CO2: 25 mEq/L (ref 19–32)
CREATININE: 0.72 mg/dL (ref 0.40–1.20)
Calcium: 9 mg/dL (ref 8.4–10.5)
GFR: 90.13 mL/min (ref >60.00)
GLUCOSE: 98 mg/dL (ref 70–99)
Potassium: 3.8 mEq/L (ref 3.5–5.1)
SODIUM: 137 meq/L (ref 135–145)
Total Bilirubin: 0.3 mg/dL (ref 0.2–1.2)
Total Protein: 7.4 g/dL (ref 6.0–8.3)

## 2017-06-10 LAB — FERRITIN: FERRITIN: 5 ng/mL — AB (ref 10.0–291.0)

## 2017-06-10 MED ORDER — SODIUM CHLORIDE 0.9 % IV SOLN: 500.0000 mL | INTRAVENOUS | Status: DC

## 2017-06-10 NOTE — Progress Notes (Signed)
Called to room to assist during endoscopic procedure.  Patient ID and intended procedure confirmed with present staff. Received instructions for my participation in the procedure from the performing physician.  

## 2017-06-10 NOTE — Progress Notes (Signed)
Pt's states no medical or surgical changes since previsit or office visit. 

## 2017-06-10 NOTE — Progress Notes (Signed)
Per Dr. Carlean Purl send path [place].  Walker notified. MAW

## 2017-06-10 NOTE — Op Note (Signed)
[facility] Patient Name: Savannah Cook Procedure Date: 06/10/2017 10:58 AM MRN: [id number] Endoscopist: Gatha Mayer , MD Age: 51 Referring MD:  Date of Birth: [birth year] Gender: Female Account #: [id number] Procedure:                Colonoscopy Indications:              Heme positive stool, Iron deficiency anemia Medicines:                Propofol per Anesthesia, Monitored Anesthesia Care Procedure:                Pre-Anesthesia Assessment:                           - Prior to the procedure, a History and Physical                            was performed, and patient medications and                            allergies were reviewed. The patient's tolerance of                            previous anesthesia was also reviewed. The risks                            and benefits of the procedure and the sedation                            options and risks were discussed with the patient.                            All questions were answered, and informed consent                            was obtained. Prior Anticoagulants: The patient has                            taken no previous anticoagulant or antiplatelet                            agents. ASA Grade Assessment: II - A patient with                            mild systemic disease. After reviewing the risks                            and benefits, the patient was deemed in                            satisfactory condition to undergo the procedure.                           After obtaining informed consent, the colonoscope  was passed under direct vision. Throughout the                            procedure, the patient's blood pressure, pulse, and                            oxygen saturations were monitored continuously. The                            Colonoscope was introduced through the anus and                            advanced to the the cecum, identified by   appendiceal orifice and ileocecal valve. The                            colonoscopy was performed without difficulty. The                            patient tolerated the procedure well. The quality                            of the bowel preparation was good. The appendiceal                            orifice and the rectum were photographed. The bowel                            preparation used was Miralax. Scope In: 11:13:01 AM Scope Out: 11:22:14 AM Scope Withdrawal Time: 0 hours 6 minutes 30 seconds  Total Procedure Duration: 0 hours 9 minutes 13 seconds  Findings:                 The perianal and digital rectal examinations were                            normal.                           A fungating, infiltrative and ulcerated                            non-obstructing large mass was found in the                            ascending colon and in the cecum. The mass was                            circumferential. The mass measured five cm in                            length. Oozing was present. This was biopsied with                            a cold forceps for histology.  Verification of                            patient identification for the specimen was done.                            Estimated blood loss was minimal.                           Multiple small-mouthed diverticula were found in                            the sigmoid colon.                           The exam was otherwise without abnormality on                            direct and retroflexion views. Complications:            No immediate complications. Estimated Blood Loss:     Estimated blood loss was minimal. 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 Biopsied to confirm. Long, ulcerated and                            probably involving IC valve which I could not                            identify clearly.                           - The  examination was otherwise normal on direct                            and retroflexion views. Recommendation:           - Patient has a contact number available for                            emergencies. The signs and symptoms of potential                            delayed complications were discussed with the                            patient. Return to normal activities tomorrow.                            Written discharge instructions were provided to the                            patient.                           -  Resume previous diet.                           - Continue present medications.                           - Await pathology results.                           - I have ordered CBC, CMET and CEA                           Needs CT chest, abd, pelvis with contrast ordered                            by my office and referral to Dr. Johney Maine or Dr.                            Marcello Moores re: right colon cancer                           - Repeat colonoscopy is recommended. The                            colonoscopy date will be determined after pathology                            results from today's exam become available for                            review. Gatha Mayer, MD 06/10/2017 11:39:25 AM This report has been signed electronically.

## 2017-06-10 NOTE — Telephone Encounter (Signed)
Patient notified of the CT scan for 06/14/17 8:30.  She is aware of the instructions.  She is also notified that she will get a call from [facility] about scheduling a consult for surgery.

## 2017-06-10 NOTE — Patient Instructions (Addendum)
The colonoscopy revealed a mass in the colon that looks like cancer, unfortunately. Based upon my experience I think that is what it is - and will need to be removed surgically. Pathology results should be back by Wednesday 6/27 - I will contact you.  There were some small polyps in the stomach that I took biopsies of - I do not think these are a problem.  I am ordering CT scans and labs to better understand this and will make a referral to a surgeon.  Sorry to have to tell you this but I know we can help you, just don't have all of the information to give you better information at this time.  I appreciate the opportunity to care for you. Gatha Mayer, MD, [facility]   Polyp handout given to patient.  YOU HAD AN ENDOSCOPIC PROCEDURE TODAY AT [facility]:   Refer to the procedure report that was given to you for any specific questions about what was found during the examination.  If the procedure report does not answer your questions, please call your gastroenterologist to clarify.  If you requested that your care partner not be given the details of your procedure findings, then the procedure report has been included in a sealed envelope for you to review at your convenience later.  YOU SHOULD EXPECT: Some feelings of bloating in the abdomen. Passage of more gas than usual.  Walking can help get rid of the air that was put into your GI tract during the procedure and reduce the bloating. If you had a lower endoscopy (such as a colonoscopy or flexible sigmoidoscopy) you may notice spotting of blood in your stool or on the toilet paper. If you underwent a bowel prep for your procedure, you may not have a normal bowel movement for a few days.  Please Note:  You might notice some irritation and congestion in your nose or some drainage.  This is from the oxygen used during your procedure.  There is no need for concern and it should clear up in a day or so.  SYMPTOMS TO REPORT  IMMEDIATELY:   Following lower endoscopy (colonoscopy or flexible sigmoidoscopy):  Excessive amounts of blood in the stool  Significant tenderness or worsening of abdominal pains  Swelling of the abdomen that is new, acute  Fever of 100F or higher   For urgent or emergent issues, a gastroenterologist can be reached at any hour by calling [phone].   DIET:  We do recommend a small meal at first, but then you may proceed to your regular diet.  Drink plenty of fluids but you should avoid alcoholic beverages for 24 hours.  ACTIVITY:  You should plan to take it easy for the rest of today and you should NOT DRIVE or use heavy machinery until tomorrow (because of the sedation medicines used during the test).    FOLLOW UP: Our staff will call the number listed on your records the next business day following your procedure to check on you and address any questions or concerns that you may have regarding the information given to you following your procedure. If we do not reach you, we will leave a message.  However, if you are feeling well and you are not experiencing any problems, there is no need to return our call.  We will assume that you have returned to your regular daily activities without incident.  If any biopsies were taken you will be contacted by phone or  by letter within the next 1-3 weeks.  Please call us at [phone] if you have not heard about the biopsies in 3 weeks.    SIGNATURES/CONFIDENTIALITY: You and/or your care partner have signed paperwork which will be entered into your electronic medical record.  These signatures attest to the fact that that the information above on your After Visit Summary has been reviewed and is understood.  Full responsibility of the confidentiality of this discharge information lies with you and/or your care-partner.YOU HAD AN ENDOSCOPIC PROCEDURE TODAY AT [facility]:   Refer to the procedure report that was given to you for  any specific questions about what was found during the examination.  If the procedure report does not answer your questions, please call your gastroenterologist to clarify.  If you requested that your care partner not be given the details of your procedure findings, then the procedure report has been included in a sealed envelope for you to review at your convenience later.  YOU SHOULD EXPECT: Some feelings of bloating in the abdomen. Passage of more gas than usual.  Walking can help get rid of the air that was put into your GI tract during the procedure and reduce the bloating. If you had a lower endoscopy (such as a colonoscopy or flexible sigmoidoscopy) you may notice spotting of blood in your stool or on the toilet paper. If you underwent a bowel prep for your procedure, you may not have a normal bowel movement for a few days.  Please Note:  You might notice some irritation and congestion in your nose or some drainage.  This is from the oxygen used during your procedure.  There is no need for concern and it should clear up in a day or so.  SYMPTOMS TO REPORT IMMEDIATELY:  Following upper endoscopy (EGD)  Vomiting of blood or coffee ground material  New chest pain or pain under the shoulder blades  Painful or persistently difficult swallowing  New shortness of breath  Fever of 100F or higher  Black, tarry-looking stools  For urgent or emergent issues, a gastroenterologist can be reached at any hour by calling [phone].

## 2017-06-10 NOTE — Op Note (Signed)
[place] Patient Name: Savannah Cook Procedure Date: 06/10/2017 10:58 AM MRN: [id number] Endoscopist: Gatha Mayer , MD Age: 51 Referring MD:  Date of Birth: [birth year] Gender: Female Account #: [id number] Procedure:                Upper GI endoscopy Indications:              Heartburn, Heme positive stool Medicines:                Propofol per Anesthesia, Monitored Anesthesia Care Procedure:                Pre-Anesthesia Assessment:                           - Prior to the procedure, a History and Physical                            was performed, and patient medications and                            allergies were reviewed. The patient's tolerance of                            previous anesthesia was also reviewed. The risks                            and benefits of the procedure and the sedation                            options and risks were discussed with the patient.                            All questions were answered, and informed consent                            was obtained. Prior Anticoagulants: The patient has                            taken no previous anticoagulant or antiplatelet                            agents. ASA Grade Assessment: II - A patient with                            mild systemic disease. After reviewing the risks                            and benefits, the patient was deemed in                            satisfactory condition to undergo the procedure.                           After obtaining informed consent, the endoscope was  passed under direct vision. Throughout the                            procedure, the patient's blood pressure, pulse, and                            oxygen saturations were monitored continuously. The                            Endoscope was introduced through the mouth, and                            advanced to the second part of duodenum. The upper   GI endoscopy was accomplished without difficulty.                            The patient tolerated the procedure well. Scope In: Scope Out: Findings:                 Multiple diminutive semi-sessile polyps with no                            stigmata of recent bleeding were found in the                            gastric fundus and in the gastric body. Biopsies                            were taken with a cold forceps for histology.                            Verification of patient identification for the                            specimen was done. Estimated blood loss was minimal.                           The exam was otherwise without abnormality.                           The cardia and gastric fundus were normal on                            retroflexion. Complications:            No immediate complications. Estimated Blood Loss:     Estimated blood loss was minimal. Impression:               - Multiple gastric polyps. Biopsied.                           - The examination was otherwise normal. Recommendation:           - Patient has a contact number available for  emergencies. The signs and symptoms of potential                            delayed complications were discussed with the                            patient. Return to normal activities tomorrow.                            Written discharge instructions were provided to the                            patient.                           - Resume previous diet.                           - Continue present medications.                           - Await pathology results.                           - See the other procedure note for documentation of                            additional recommendations. Gatha Mayer, MD 06/10/2017 11:33:08 AM This report has been signed electronically.

## 2017-06-10 NOTE — Progress Notes (Signed)
Report to PACU, RN, vss, BBS= Clear.  

## 2017-06-11 ENCOUNTER — Telehealth: Payer: Self-pay | Admitting: Internal Medicine

## 2017-06-11 ENCOUNTER — Telehealth: Payer: Self-pay | Admitting: *Deleted

## 2017-06-11 ENCOUNTER — Telehealth: Payer: Self-pay

## 2017-06-11 DIAGNOSIS — C189 Malignant neoplasm of colon, unspecified: Secondary | ICD-10-CM

## 2017-06-11 LAB — CEA: CEA: 3.6 ng/mL — AB

## 2017-06-11 NOTE — Telephone Encounter (Signed)
  Follow up Call-  Call back number 06/10/2017  Post procedure Call Back phone  # [phone]  Permission to leave phone message Yes  Some recent data might be hidden     Patient questions:  Message left to call us if necessary.

## 2017-06-11 NOTE — Telephone Encounter (Signed)
Left message for patient to call back New order placed for oncology

## 2017-06-11 NOTE — Telephone Encounter (Signed)
Patient notified of the recommendations and the referral to Oncology.  She is advised that she will get a call from them directly about an appt for Oncology.  She wanted you to know that she will see Dr. Zella Richer on 07/03/17.  She saw him 5 years ago to discuss a RLQ pain.  She is wondering now if that and the colon cancer are related? She asked that I pass this along to you.

## 2017-06-11 NOTE — Telephone Encounter (Signed)
Patient reports that she had a very small amount of blood in the stool this am and when she wipes. Denies fever, nausea, vomiting, abdominal pain.  She does note some very minor lower back pain, she thinks it may be stress related. She asked how long she could expect to see the bleeding.  She is advised that it is most likely from the biopsy that was performed yesterday and she should contact the office if the bleeding increases. Dr. Carlean Purl please review and advise

## 2017-06-11 NOTE — Telephone Encounter (Signed)
Pt. Called.   Sheri took the call and routed it to Dr. Carlean Purl.

## 2017-06-11 NOTE — Telephone Encounter (Signed)
Small amount of bleeding not a surprise  Please tell her that the colon bxs unfortunately confirm cancer as I told her it was  The stomach polyps are benign and do not think they will need follow-up  Labs ok but iron still low - needs referral to oncology Dr. Burr Medico or Benay Spice re: ascending colon cancer  Will contact her with CT results when they are in

## 2017-06-11 NOTE — Progress Notes (Signed)
Colonoscopy recall 1 year 2019 Will call from office re: colon cancer as suspected and benign fuindic gland polyps from stomach See phone note that exists

## 2017-06-13 ENCOUNTER — Telehealth: Payer: Self-pay | Admitting: Oncology

## 2017-06-13 ENCOUNTER — Encounter: Payer: Self-pay | Admitting: Oncology

## 2017-06-13 NOTE — Telephone Encounter (Signed)
Appt has been scheduled for the pt to see Dr. Benay Spice on 7/10 at 2pm. Pt agreed to the appt date and time. Demographics verified. Letter mailed to the pt

## 2017-06-14 ENCOUNTER — Ambulatory Visit (INDEPENDENT_AMBULATORY_CARE_PROVIDER_SITE_OTHER)
Admission: RE | Admit: 2017-06-14 | Discharge: 2017-06-14 | Disposition: A | Discharge status: Home / Self Care | Payer: BLUE CROSS/BLUE SHIELD | Source: Ambulatory Visit | Attending: Internal Medicine | Admitting: Internal Medicine

## 2017-06-14 ENCOUNTER — Encounter: Payer: BLUE CROSS/BLUE SHIELD | Admitting: Women's Health

## 2017-06-14 DIAGNOSIS — C189 Malignant neoplasm of colon, unspecified: Secondary | ICD-10-CM

## 2017-06-14 DIAGNOSIS — R911 Solitary pulmonary nodule: Secondary | ICD-10-CM | POA: Diagnosis not present

## 2017-06-14 MED ORDER — IOPAMIDOL (ISOVUE-300) INJECTION 61%
100.0000 mL | Freq: Once | INTRAVENOUS | Status: AC | PRN
  Administered 2017-06-14: 100 mL via INTRAVENOUS

## 2017-06-14 NOTE — Progress Notes (Signed)
My chart message RE no signs of distant metastases with the exception of 1 tiny lung lesion of unclear significance though radiologist thinks it appears benign

## 2017-06-18 ENCOUNTER — Telehealth: Payer: Self-pay | Admitting: Internal Medicine

## 2017-06-18 ENCOUNTER — Ambulatory Visit: Payer: Self-pay | Admitting: General Surgery

## 2017-06-18 DIAGNOSIS — C182 Malignant neoplasm of ascending colon: Secondary | ICD-10-CM | POA: Diagnosis not present

## 2017-06-18 DIAGNOSIS — K8020 Calculus of gallbladder without cholecystitis without obstruction: Secondary | ICD-10-CM | POA: Diagnosis not present

## 2017-06-18 NOTE — Telephone Encounter (Signed)
Left message for patient to call back  

## 2017-06-18 NOTE — H&P (Signed)
Savannah Cook 06/18/2017 3:34 PM Location: [facility] Patient #: [id number] DOB: 07-26-1966 Single / Language: [name] / Race: White Female  History of Present Illness Odis Hollingshead MD; 06/18/2017 6:09 PM) The patient is a 51 year old female.   Note:She is referred by Dr. Carlean Purl for consultation regarding newly diagnosed adenocarcinoma of the right colon. She had some anemia and had heme positive stools. Colonoscopy was performed. A fungating mass was noted in the right colon near the cecum. Biopsy was positive for adenocarcinoma. CEA level is slightly elevated. Liver function tests are normal. Hemoglobin is 11.2. CT scan of abdomen and pelvis demonstrates the tumor (7 cm) as well as a large gallstone. Small slightly enlarged lymph nodes around the area of the mass. However there is no evidence of metastatic disease. Her grandfather had colon cancer at the age of 79. She does describe 2 attacks of what sound like biliary colic in the past. She is here with her female companion.  He does report having some pain in the right proximal thigh area.  I have reviewed the colonoscopy, pathology, and CT scans.  Diagnostic Studies History Dalbert Mayotte, [place]; 06/18/2017 3:34 PM) Colonoscopy never Mammogram within last year Pap Smear 1-5 years ago  Allergies Dalbert Mayotte, CMA; 06/18/2017 3:36 PM) Ceclor *CEPHALOSPORINS* Allergies Reconciled  Medication History Dalbert Mayotte, CMA; 06/18/2017 3:41 PM) Prenatal 1 mg + Iron (Oral) Specific strength unknown - Active. Elderberry (Oral) Specific strength unknown - Active. Biotin (10000MCG Tablet, Oral) Active. Cholecalciferol (Oral) Specific strength unknown - Active. Flonase (Nasal) Specific strength unknown - Active. Medications Reconciled  Social History Dalbert Mayotte, [place]; 06/18/2017 3:34 PM) Alcohol use Occasional alcohol use. Caffeine use Coffee, Tea. No drug use Tobacco use Former smoker.  Family  History Dalbert Mayotte, [place]; 06/18/2017 3:34 PM) Cerebrovascular Accident Family Members In [place] Members In General. Diabetes Mellitus Brother, Father, Mother, Sister. Melanoma Mother. Prostate Cancer Father. Respiratory Condition Father, Mother.  Pregnancy / Birth History Dalbert Mayotte, [place]; 06/18/2017 3:34 PM) Age at menarche 88 years. Contraceptive History Oral contraceptives. Gravida 2 Length (months) of breastfeeding 3-6 Maternal age 51-35 Para 1 Regular periods  Other Problems Dalbert Mayotte, CMA; 06/18/2017 3:34 PM) Anxiety Disorder Cholelithiasis Diverticulosis Gastroesophageal Reflux Disease     Review of Systems Dalbert Mayotte CMA; 06/18/2017 3:34 PM) General Present- Fatigue. Not Present- Appetite Loss, Chills, Fever, Night Sweats, Weight Gain and Weight Loss. Skin Not Present- Change in Wart/Mole, Dryness, Hives, Jaundice, New Lesions, Non-Healing Wounds, Rash and Ulcer. HEENT Present- Wears glasses/contact lenses. Not Present- Earache, Hearing Loss, Hoarseness, Nose Bleed, Oral Ulcers, Ringing in the Ears, Seasonal Allergies, Sinus Pain, Sore Throat, Visual Disturbances and Yellow Eyes. Respiratory Present- Snoring. Not Present- Bloody sputum, Chronic Cough, Difficulty Breathing and Wheezing. Breast Not Present- Breast Mass, Breast Pain, Nipple Discharge and Skin Changes. Cardiovascular Not Present- Chest Pain, Difficulty Breathing Lying Down, Leg Cramps, Palpitations, Rapid Heart Rate, Shortness of Breath and Swelling of Extremities. Gastrointestinal Present- Bloating. Not Present- Abdominal Pain, Bloody Stool, Change in Bowel Habits, Chronic diarrhea, Constipation, Difficulty Swallowing, Excessive gas, Gets full quickly at meals, Hemorrhoids, Indigestion, Nausea, Rectal Pain and Vomiting. Female Genitourinary Present- Frequency. Not Present- Nocturia, Painful Urination, Pelvic Pain and Urgency. Musculoskeletal Not Present- Back  Pain, Joint Pain, Joint Stiffness, Muscle Pain, Muscle Weakness and Swelling of Extremities. Neurological Not Present- Decreased Memory, Fainting, Headaches, Numbness, Seizures, Tingling, Tremor, Trouble walking and Weakness. Psychiatric Present- Anxiety. Not Present- Bipolar, Change in Sleep Pattern, Depression, Fearful and Frequent  crying. Endocrine Present- Hair Changes. Not Present- Cold Intolerance, Excessive Hunger, Heat Intolerance, Hot flashes and New Diabetes. Hematology Present- Gland problems. Not Present- Blood Thinners, Easy Bruising, Excessive bleeding, HIV and Persistent Infections.  Vitals Dalbert Mayotte CMA; 06/18/2017 3:42 PM) 06/18/2017 3:41 PM Weight: 181.4 lb Height: 62in Body Surface Area: 1.83 m Body Mass Index: 33.18 kg/m  Temp.: 97.37F  Pulse: 107 (Regular)  BP: 142/90 (Sitting, Left Arm, Standard)      Physical Exam Odis Hollingshead MD; 06/18/2017 6:09 PM)  The physical exam findings are as follows: Note:GENERAL APPEARANCE: Obese female in NAD. Pleasant and cooperative.  EARS, NOSE, MOUTH THROAT: [place]/AT external ears: no lesions or deformities external nose: no lesions or deformities hearing: grossly normal lips: moist, no deformities EYES external: conjunctiva, lids, sclerae normal pupils: equal, round   NECK: Supple, no obvious mass or thyroid mass/enlargement, no trachea deviation  CV ascultation: RRR, no murmur extremity edema: no extremity varicosities: no  RESP/CHEST auscultation: breath sounds equal and clear respiratory effort: normal   GASTROINTESTINAL abdomen: Soft, non-tender, non-distended, no masses liver and spleen: not enlarged. hernia: none present scar: none present  MUSCULOSKELETAL station and gait: normal digits/nails: no clubbing or cyanosis muscle strength: grossly normal all extremities deformities: none instability: none  SKIN jaundice: none rash or lesion: none  NEUROLOGIC speech:  normal  PSYCHIATRIC alertness and orientation: normal mood/affect/behavior: normal judgement and insight: normal    Assessment & Plan Odis Hollingshead MD; 06/18/2017 6:10 PM)  CANCER OF RIGHT COLON (C18.2) Impression: No evidence of metastatic disease on CT scan. CEA level is slightly elevated. We discussed recommendations for treatment specifically surgical recommendation. We discussed staging and the fact that we would not know the stage until the surgery has been done and the pathologist has looked at everything.  Plan: I recommended laparoscopic partial colectomy. If the operation goes well, we can proceed with laparoscopic cholecystectomy at the same time. I have explained the procedure and risks of colon resection. Risks include but are not limited to bleeding, infection, wound problems, anesthesia, anastomotic leak, need for colostomy, need for reoperative surgery, injury to intraabominal organs (such as bile duct, intestine, spleen, kidney, bladder, ureter, etc.), ileus, irregular bowel habits. She seems to understand and agrees to proceed.  Jackolyn Confer, M.D.

## 2017-06-18 NOTE — Telephone Encounter (Signed)
Patient notified that there are no notes in her chart from Dr. Micheline Chapman.  I notified her that Dr. Carlean Purl did mention some of the notes from Dr. Prentice Docker visit.

## 2017-06-25 ENCOUNTER — Encounter: Payer: Self-pay | Admitting: Women's Health

## 2017-06-25 ENCOUNTER — Ambulatory Visit (HOSPITAL_BASED_OUTPATIENT_CLINIC_OR_DEPARTMENT_OTHER): Payer: BLUE CROSS/BLUE SHIELD | Admitting: Oncology

## 2017-06-25 VITALS — BP 136/78 | HR 85 | Temp 99.0°F | Resp 20 | Ht 61.0 in | Wt 180.9 lb

## 2017-06-25 DIAGNOSIS — Z1231 Encounter for screening mammogram for malignant neoplasm of breast: Secondary | ICD-10-CM | POA: Diagnosis not present

## 2017-06-25 DIAGNOSIS — C182 Malignant neoplasm of ascending colon: Secondary | ICD-10-CM

## 2017-06-25 DIAGNOSIS — D630 Anemia in neoplastic disease: Secondary | ICD-10-CM | POA: Diagnosis not present

## 2017-06-25 NOTE — Progress Notes (Signed)
[facility] New Patient Consult   Referring MD: Tyesha Joffe 51 y.o.  05-21-1966    Reason for Referral: Colon cancer   HPI: She reports being diagnosed with iron deficiency in the spring of 2017 by her gynecologist. She noted ice craving and brittle hair. She took iron for approximately 3 months and reports correction of the anemia.  She relocated to [place] and saw Dr. Rozetta Nunnery. She had intermittent abdominal pain. A stool sample returned Hemoccult positive. She was referred to Dr. Carlean Purl. On 06/10/2017 the hemoglobin returned at 11.2 with an MCV of 78.4. The ferritin was measured at 5.0.  She was taken to an upper endoscopy and colonoscopy 06/10/2017. Multiple semi-sessile polyps were found in the gastric fundus and gastric body. Biopsies were obtained. A fungating nonobstructing mass was found in the ascending colon and cecum. This was present. The mass was biopsied. Multiple diverticula were noted in the sigmoid colon. The pathology ([id number]) confirmed fundic gland polyps with no intestinal metaplasia, dysplasia, or malignancy. The biopsy from the ascending colon revealed adenocarcinoma.  She was referred for staging CTs on 06/14/2017. A 2 mm triangular shaped nodule was noted in the left upper lung. A single gallstone was noted. No evidence of liver metastases. A circumferential mass was noted at the ascending colon with faint stranding in the mesentery and multiple adjacent pericolic lymph nodes. Scattered sigmoid diverticula. Additional small porta hepatis and retroperitoneal nodes were noted and are not pathologically enlarged. No ascites or peritoneal metastases.  She was referred to Dr. Zella Richer and is scheduled for surgery 07/25/2017.  She is taking iron. She takes multiple vitamin and herbal preparations. She was enrolled in a weight loss plan in 2017.    Past Medical History:  Diagnosis Date  . Anxiety   . Seasonal allergies     . G2  P1, 1 miscarriage, premenopausal  Past Surgical History:  Procedure Laterality Date  . WISDOM TOOTH EXTRACTION    . WRIST SURGERY Right 04/2012    Medications: Reviewed  Allergies:  Allergies  Allergen Reactions  . Other     SEASONAL  . Ceclor [Cefaclor] Rash    Family history: Her father died of lung cancer in his 15s and had a history of smoking. Her maternal grandfather had colon cancer in his 7s and live to be 100. She has 2 brothers and one sister. One daughter.  Social History:   She works as an Sales promotion account executive. She does not use cigarettes. She reports rare alcohol use. No transfusion history. No risk factor for HIV or hepatitis.  ROS:   Positives include: Several episodes of acute abdominal pain in 2017, intentional 25 pound weight loss over 2 months in 2017 with a diet program, loose stool, recent rash over the upper abdomen-resolved  A complete ROS was otherwise negative.  Physical Exam:  Blood pressure 136/78, pulse 85, temperature 99 F (37.2 C), resp. rate 20, height 5\' 1"  (1.549 m), weight 180 lb 14.4 oz (82.1 kg), SpO2 98 %.  HEENT: Oropharynx without visible mass, neck without mass Lungs: Clear bilaterally Cardiac: Regular rate and rhythm Abdomen: No hepatosplenomegaly, no mass, nontender  Vascular: No leg edema Lymph nodes: No cervical, supraclavicular, axillary, or inguinal nodes Neurologic: Alert and oriented, the motor exam appears intact in the upper and lower extremities Skin: No rash, 2 small ecchymoses at the right upper arm Musculoskeletal: No spine tenderness   LAB:  CBC  Lab Results  Component Value Date  WBC 7.9 06/10/2017   HGB 11.2 (L) 06/10/2017   HCT 34.6 (L) 06/10/2017   MCV 78.4 06/10/2017   PLT 369.0 06/10/2017   NEUTROABS 5.5 06/10/2017        CMP     Component Value Date/Time   NA 137 06/10/2017 1214   K 3.8 06/10/2017 1214   CL 105 06/10/2017 1214   CO2 25 06/10/2017 1214   GLUCOSE 98 06/10/2017 1214    BUN 9 06/10/2017 1214   CREATININE 0.72 06/10/2017 1214   CALCIUM 9.0 06/10/2017 1214   PROT 7.4 06/10/2017 1214   ALBUMIN 3.9 06/10/2017 1214   AST 14 06/10/2017 1214   ALT 13 06/10/2017 1214   ALKPHOS 70 06/10/2017 1214   BILITOT 0.3 06/10/2017 1214   CEA on 06/10/2017-3.6    Imaging:  CT images 06/14/2017-reviewed   Assessment/Plan:   1. Adenocarcinoma of the ascending colon/cecum, status post a colonic biopsy 06/10/2017  Staging CTs 06/14/2017-ascending colon mass, small pericecal lymph nodes, 2 mm left upper lobe nodule, non-pathologically enlarged porta hepatis and retroperitoneal nodes 2. Iron deficiency anemia secondary to #1  3.   Family history of colon cancer    Disposition:   [name]  has been diagnosed with colon cancer. She is scheduled to undergo surgery in a few weeks. I discussed treatment of colon cancer with her. She understands adjuvant treatment recommendations will depend on the surgical pathology.  The resected tumor will be evaluated for hereditary non-polyposis colon cancer syndrome. She understands family members are at increased risk of developing colon cancer and should receive appropriate screening.  She will return for an office visit approximately 2 weeks following surgery to review the surgical pathology report and discuss adjuvant treatment options.  50 minutes were spent with the patient today. The majority of the time was used for counseling and coordination of care.  Donneta Romberg, MD  06/25/2017, 2:37 PM

## 2017-06-26 ENCOUNTER — Telehealth: Payer: Self-pay | Admitting: Nutrition

## 2017-06-26 NOTE — Telephone Encounter (Signed)
I was asked by nursing to contact patient as she had nutrition related questions.  Patient has newly diagnosed colon cancer.  I called patient on mobile phone however, she was at work and wanted to call me back.  I offered her an appointment to discuss whatever nutrition concerns.  She will return call when she has a free moment at work.

## 2017-06-27 ENCOUNTER — Telehealth: Payer: Self-pay | Admitting: Oncology

## 2017-06-27 NOTE — Telephone Encounter (Signed)
Lvm advising appt 8/24 @ 8am. Also mailed calendar.

## 2017-07-01 ENCOUNTER — Ambulatory Visit (INDEPENDENT_AMBULATORY_CARE_PROVIDER_SITE_OTHER): Payer: BLUE CROSS/BLUE SHIELD | Admitting: Women's Health

## 2017-07-01 ENCOUNTER — Encounter: Payer: Self-pay | Admitting: Women's Health

## 2017-07-01 VITALS — BP 122/78 | Ht 62.0 in | Wt 175.0 lb

## 2017-07-01 DIAGNOSIS — R87610 Atypical squamous cells of undetermined significance on cytologic smear of cervix (ASC-US): Secondary | ICD-10-CM | POA: Diagnosis not present

## 2017-07-01 DIAGNOSIS — R8279 Other abnormal findings on microbiological examination of urine: Secondary | ICD-10-CM | POA: Diagnosis not present

## 2017-07-01 DIAGNOSIS — Z1151 Encounter for screening for human papillomavirus (HPV): Secondary | ICD-10-CM | POA: Diagnosis not present

## 2017-07-01 DIAGNOSIS — Z01419 Encounter for gynecological examination (general) (routine) without abnormal findings: Secondary | ICD-10-CM | POA: Diagnosis not present

## 2017-07-01 DIAGNOSIS — R319 Hematuria, unspecified: Secondary | ICD-10-CM | POA: Diagnosis not present

## 2017-07-01 NOTE — Patient Instructions (Signed)
Open Colectomy An open colectomy is surgery to remove part or all of the large intestine (colon). This procedure may be used to treat several conditions, including:  Inflammation and infection of the colon (diverticulitis).  Tumors or masses in the colon.  Inflammatory bowel disease, such as Crohn disease or ulcerative colitis.  Bleeding from the colon.  Blockage or obstruction of the colon.  Tell a health care provider about:  Any allergies you have.  All medicines you are taking, including vitamins, herbs, eye drops, creams, and over-the-counter medicines.  Any problems you or family members have had with anesthetic medicines.  Any blood disorders you have.  Any surgeries you have had.  Any medical conditions you have.  Whether you are pregnant or may be pregnant.  Whether you smoke or use tobacco products. These can affect your body's reaction to anesthesia. What are the risks? Generally, this is a safe procedure. However, problems may occur, including:  Infection.  Bleeding.  Allergic reactions to medicines.  Damage to other structures or organs.  Pneumonia.  The incision opening up.  Tissues from inside the abdomen bulging through the incision (hernia).  Reopening of the colon where it was stitched or stapled together.  A blood clot forming in a vein and traveling to the lungs.  Future blockage of the small intestine from scar tissue.  What happens before the procedure? Staying hydrated Follow instructions from your health care provider about hydration, which may include:  Up to 2 hours before the procedure - you may continue to drink clear liquids, such as water, clear fruit juice, black coffee, and plain tea.  Eating and drinking restrictions Follow instructions from your health care provider about eating and drinking, which may include:  8 hours before the procedure - stop eating heavy meals or foods such as meat, fried foods, or fatty foods.  6  hours before the procedure - stop eating light meals or foods, such as toast or cereal.  6 hours before the procedure - stop drinking milk or drinks that contain milk.  2 hours before the procedure - stop drinking clear liquids.  Bowel prep In some cases, you may be prescribed an oral bowel prep to clean out your colon. If so:  Take it as told by your health care provider. Starting the day before your procedure, you may need to drink a large amount of medicated liquid. The liquid will cause you to have multiple loose stools until your stool is almost clear or light green.  Follow instructions from your health care provider about eating and drinking restrictions during bowel prep.  Medicines  Ask your health care provider about: ? Changing or stopping your regular medicines or vitamins. This is especially important if you are taking diabetes medicines, blood thinners, or vitamin E. ? Taking medicines such as aspirin and ibuprofen. These medicines can thin your blood. Do not take these medicines before your procedure if your health care provider instructs you not to.  If you were prescribed an antibiotic medicine, take it as told by your health care provider. General instructions  Bring loose-fitting, comfortable clothing and slip-on shoes that you can put on without bending over.  Make sure to see your health care provider for any tests that you need before the procedure, such as: ? Blood tests. ? A test to check the heart's rhythm (electrocardiogram, ECG). ? A CT scan of your abdomen. ? Urine tests. ? Colonoscopy.  Plan to have someone take you home from the  hospital or clinic.  Arrange for someone to help you with your activities during your recovery. What happens during the procedure?  To reduce your risk of infection: ? Your health care team will wash or sanitize their hands. ? Your skin will be washed with soap. ? Hair may be removed from the surgical area.  An IV tube  will be inserted into one of your veins. The tube will be used to give you medicines and fluids.  You will be given a medicine to make you fall asleep (general anesthetic). You may also be given a medicine to help you relax (sedative).  Small monitors will be connected to your body. They will be used to check your heart, blood pressure, and oxygen level.  A breathing tube may be placed into your lungs during the procedure.  A thin, flexible tube (catheter) will be placed into your bladder to drain urine.  A tube may be inserted through your nose and into your stomach (nasogastric tube, or NG tube). The tube is used to remove stomach fluids after surgery until the intestines start working again.  An incision will be made in your abdomen.  Clamps or staples will be put on your colon.  The part of the colon between the clamps or staples will be removed.  The ends of the colon that remain will be stitched or stapled together.  The incision in your abdomen will be closed with stitches (sutures) or staples.  The incision will be covered with a bandage (dressing).  A small opening (stoma) may be created in your lower abdomen. A removable, external pouch (ostomy pouch) will be attached to the stoma. This pouch will collect stool outside of your body. Stool passes through the stoma and into the pouch instead of through your anus. The procedure may vary among health care providers and hospitals. What happens after the procedure?  Your blood pressure, heart rate, breathing rate, and blood oxygen level will be monitored until the medicines you were given have worn off.  You may continue to receive fluids and medicines through an IV tube.  You will start on a clear liquid diet and gradually go back to a normal diet.  Do not drive until your health care provider approves.  You may have some pain in your abdomen. You will be given pain medicine to control the pain.  You will be encouraged to  do the following: ? Do breathing exercises to prevent pneumonia. ? Get up and start walking within a day after surgery. You should try to get up 5-6 times a day. This information is not intended to replace advice given to you by your health care provider. Make sure you discuss any questions you have with your health care provider. Document Released: 09/30/2009 Document Revised: 09/03/2016 Document Reviewed: 09/03/2016 Elsevier Interactive Patient Education  2018 [place] Maintenance, Female Adopting a healthy lifestyle and getting preventive care can go a long way to promote health and wellness. Talk with your health care provider about what schedule of regular examinations is right for you. This is a good chance for you to check in with your provider about disease prevention and staying healthy. In between checkups, there are plenty of things you can do on your own. Experts have done a lot of research about which lifestyle changes and preventive measures are most likely to keep you healthy. Ask your health care provider for more information. Weight and diet Eat a healthy diet  Be sure to  include plenty of vegetables, fruits, low-fat dairy products, and lean protein.  Do not eat a lot of foods high in solid fats, added sugars, or salt.  Get regular exercise. This is one of the most important things you can do for your health. ? Most adults should exercise for at least 150 minutes each week. The exercise should increase your heart rate and make you sweat (moderate-intensity exercise). ? Most adults should also do strengthening exercises at least twice a week. This is in addition to the moderate-intensity exercise.  Maintain a healthy weight  Body mass index (BMI) is a measurement that can be used to identify possible weight problems. It estimates body fat based on height and weight. Your health care provider can help determine your BMI and help you achieve or maintain a healthy  weight.  For females 42 years of age and older: ? A BMI below 18.5 is considered underweight. ? A BMI of 18.5 to 24.9 is normal. ? A BMI of 25 to 29.9 is considered overweight. ? A BMI of 30 and above is considered obese.  Watch levels of cholesterol and blood lipids  You should start having your blood tested for lipids and cholesterol at 53 years of age, then have this test every 5 years.  You may need to have your cholesterol levels checked more often if: ? Your lipid or cholesterol levels are high. ? You are older than 53 years of age. ? You are at high risk for heart disease.  Cancer screening Lung Cancer  Lung cancer screening is recommended for adults 72-65 years old who are at high risk for lung cancer because of a history of smoking.  A yearly low-dose CT scan of the lungs is recommended for people who: ? Currently smoke. ? Have quit within the past 15 years. ? Have at least a 30-pack-year history of smoking. A pack year is smoking an average of one pack of cigarettes a day for 1 year.  Yearly screening should continue until it has been 15 years since you quit.  Yearly screening should stop if you develop a health problem that would prevent you from having lung cancer treatment.  Breast Cancer  Practice breast self-awareness. This means understanding how your breasts normally appear and feel.  It also means doing regular breast self-exams. Let your health care provider know about any changes, no matter how small.  If you are in your 20s or 30s, you should have a clinical breast exam (CBE) by a health care provider every 1-3 years as part of a regular health exam.  If you are 108 or older, have a CBE every year. Also consider having a breast X-ray (mammogram) every year.  If you have a family history of breast cancer, talk to your health care provider about genetic screening.  If you are at high risk for breast cancer, talk to your health care provider about having an  MRI and a mammogram every year.  Breast cancer gene (BRCA) assessment is recommended for women who have family members with BRCA-related cancers. BRCA-related cancers include: ? Breast. ? Ovarian. ? Tubal. ? Peritoneal cancers.  Results of the assessment will determine the need for genetic counseling and BRCA1 and BRCA2 testing.  Cervical Cancer Your health care provider may recommend that you be screened regularly for cancer of the pelvic organs (ovaries, uterus, and vagina). This screening involves a pelvic examination, including checking for microscopic changes to the surface of your cervix (Pap test). You may  be encouraged to have this screening done every 3 years, beginning at age 30.  For women ages 63-65, health care providers may recommend pelvic exams and Pap testing every 3 years, or they may recommend the Pap and pelvic exam, combined with testing for human papilloma virus (HPV), every 5 years. Some types of HPV increase your risk of cervical cancer. Testing for HPV may also be done on women of any age with unclear Pap test results.  Other health care providers may not recommend any screening for nonpregnant women who are considered low risk for pelvic cancer and who do not have symptoms. Ask your health care provider if a screening pelvic exam is right for you.  If you have had past treatment for cervical cancer or a condition that could lead to cancer, you need Pap tests and screening for cancer for at least 20 years after your treatment. If Pap tests have been discontinued, your risk factors (such as having a new sexual partner) need to be reassessed to determine if screening should resume. Some women have medical problems that increase the chance of getting cervical cancer. In these cases, your health care provider may recommend more frequent screening and Pap tests.  Colorectal Cancer  This type of cancer can be detected and often prevented.  Routine colorectal cancer screening  usually begins at 53 years of age and continues through 53 years of age.  Your health care provider may recommend screening at an earlier age if you have risk factors for colon cancer.  Your health care provider may also recommend using home test kits to check for hidden blood in the stool.  A small camera at the end of a tube can be used to examine your colon directly (sigmoidoscopy or colonoscopy). This is done to check for the earliest forms of colorectal cancer.  Routine screening usually begins at age 25.  Direct examination of the colon should be repeated every 5-10 years through 53 years of age. However, you may need to be screened more often if early forms of precancerous polyps or small growths are found.  Skin Cancer  Check your skin from head to toe regularly.  Tell your health care provider about any new moles or changes in moles, especially if there is a change in a mole's shape or color.  Also tell your health care provider if you have a mole that is larger than the size of a pencil eraser.  Always use sunscreen. Apply sunscreen liberally and repeatedly throughout the day.  Protect yourself by wearing long sleeves, pants, a wide-brimmed hat, and sunglasses whenever you are outside.  Heart disease, diabetes, and high blood pressure  High blood pressure causes heart disease and increases the risk of stroke. High blood pressure is more likely to develop in: ? People who have blood pressure in the high end of the normal range (130-139/85-89 mm Hg). ? People who are overweight or obese. ? People who are African American.  If you are 7-34 years of age, have your blood pressure checked every 3-5 years. If you are 77 years of age or older, have your blood pressure checked every year. You should have your blood pressure measured twice-once when you are at a hospital or clinic, and once when you are not at a hospital or clinic. Record the average of the two measurements. To check  your blood pressure when you are not at a hospital or clinic, you can use: ? An automated blood pressure machine at  a pharmacy. ? A home blood pressure monitor.  If you are between 69 years and 62 years old, ask your health care provider if you should take aspirin to prevent strokes.  Have regular diabetes screenings. This involves taking a blood sample to check your fasting blood sugar level. ? If you are at a normal weight and have a low risk for diabetes, have this test once every three years after 53 years of age. ? If you are overweight and have a high risk for diabetes, consider being tested at a younger age or more often. Preventing infection Hepatitis B  If you have a higher risk for hepatitis B, you should be screened for this virus. You are considered at high risk for hepatitis B if: ? You were born in a country where hepatitis B is common. Ask your health care provider which countries are considered high risk. ? Your parents were born in a high-risk country, and you have not been immunized against hepatitis B (hepatitis B vaccine). ? You have HIV or AIDS. ? You use needles to inject street drugs. ? You live with someone who has hepatitis B. ? You have had sex with someone who has hepatitis B. ? You get hemodialysis treatment. ? You take certain medicines for conditions, including cancer, organ transplantation, and autoimmune conditions.  Hepatitis C  Blood testing is recommended for: ? Everyone born from [birth year] through 1967. ? Anyone with known risk factors for hepatitis C.  Sexually transmitted infections (STIs)  You should be screened for sexually transmitted infections (STIs) including gonorrhea and chlamydia if: ? You are sexually active and are younger than 53 years of age. ? You are older than 53 years of age and your health care provider tells you that you are at risk for this type of infection. ? Your sexual activity has changed since you were last screened and you  are at an increased risk for chlamydia or gonorrhea. Ask your health care provider if you are at risk.  If you do not have HIV, but are at risk, it may be recommended that you take a prescription medicine daily to prevent HIV infection. This is called pre-exposure prophylaxis (PrEP). You are considered at risk if: ? You are sexually active and do not regularly use condoms or know the HIV status of your partner(s). ? You take drugs by injection. ? You are sexually active with a partner who has HIV.  Talk with your health care provider about whether you are at high risk of being infected with HIV. If you choose to begin PrEP, you should first be tested for HIV. You should then be tested every 3 months for as long as you are taking PrEP. Pregnancy  If you are premenopausal and you may become pregnant, ask your health care provider about preconception counseling.  If you may become pregnant, take 400 to 800 micrograms (mcg) of folic acid every day.  If you want to prevent pregnancy, talk to your health care provider about birth control (contraception). Osteoporosis and menopause  Osteoporosis is a disease in which the bones lose minerals and strength with aging. This can result in serious bone fractures. Your risk for osteoporosis can be identified using a bone density scan.  If you are 2 years of age or older, or if you are at risk for osteoporosis and fractures, ask your health care provider if you should be screened.  Ask your health care provider whether you should take a calcium or vitamin  D supplement to lower your risk for osteoporosis.  Menopause may have certain physical symptoms and risks.  Hormone replacement therapy may reduce some of these symptoms and risks. Talk to your health care provider about whether hormone replacement therapy is right for you. Follow these instructions at home:  Schedule regular health, dental, and eye exams.  Stay current with your  immunizations.  Do not use any tobacco products including cigarettes, chewing tobacco, or electronic cigarettes.  If you are pregnant, do not drink alcohol.  If you are breastfeeding, limit how much and how often you drink alcohol.  Limit alcohol intake to no more than 1 drink per day for nonpregnant women. One drink equals 12 ounces of beer, 5 ounces of wine, or 1 ounces of hard liquor.  Do not use street drugs.  Do not share needles.  Ask your health care provider for help if you need support or information about quitting drugs.  Tell your health care provider if you often feel depressed.  Tell your health care provider if you have ever been abused or do not feel safe at home. This information is not intended to replace advice given to you by your health care provider. Make sure you discuss any questions you have with your health care provider. Document Released: 06/18/2011 Document Revised: 05/10/2016 Document Reviewed: 09/06/2015 Elsevier Interactive Patient Education  [employer].

## 2017-07-01 NOTE — Progress Notes (Signed)
Savannah Cook 03-19-1966 [id number]    History:    Presents for annual exam.  Monthly 21-28 day cycle. 2008 ASCUS with normal Paps after, negative high risk HPV. Normal mammogram history. Scheduled for colon cancer surgery 07/25/2017 with Dr. Zella Richer. 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 Carlean Purl. Met with Dr. Learta Codding 06/25/2017, will have genetic screening done and is scheduled follow-up after surgical biopsies complete. Has a positive attitude for impending surgery.  Past medical history, past surgical history, family history and social history were all reviewed and documented in the EPIC chart. 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 Mother diabetes, COPD and bipolar. Father died of old age.  ROS:  A ROS was performed and pertinent positives and negatives are included.  Exam:  Vitals:   07/01/17 0931  BP: 122/78  Weight: 175 lb (79.4 kg)  Height: _0  (1.575 m)   Body mass index is 32.01 kg/m.   General appearance:  Normal Thyroid:  Symmetrical, normal in size, without palpable masses or nodularity. Respiratory  Auscultation:  Clear without wheezing or rhonchi Cardiovascular  Auscultation:  Regular rate, without rubs, murmurs or gallops  Edema/varicosities:  Not grossly evident Abdominal  Soft,nontender, without masses, guarding or rebound.  Liver/spleen:  No organomegaly noted  Hernia:  None appreciated  Skin  Inspection:  Grossly normal   Breasts: Examined lying and sitting.     Right: Without masses, retractions, discharge or axillary adenopathy.     Left: Without masses, retractions, discharge or axillary adenopathy. Gentitourinary   Inguinal/mons:  Normal without inguinal adenopathy  External genitalia:  Normal  BUS/Urethra/Skene's glands:  Normal  Vagina:  Normal  Cervix:  Normal  Uterus:   normal in size, shape and contour.  Midline and  mobile  Adnexa/parametria:     Rt: Without masses or tenderness.   Lt: Without masses or tenderness.  Anus and perineum: Normal  Digital rectal exam: Normal sphincter tone without palpated masses or tenderness  Assessment/Plan:  51 y.o. DW F G1 P1 for annual exam.    Monthly cycle/condoms Colon cancer-surgery scheduled August 9   Dr. Zella Richer / preop labs August 3. Obesity  Plan: SBE's, continue annual screening mammogram, calcium rich diet, iron rich foods encouraged. Will continue iron supplements until surgery. Aware of need for healthy diet, states is working diligently to eat  unprocessed healthy diet. Menopause reviewed, minimal symptoms. Pap with HR HPV typing.    Huel Cote [facility], 12:55 PM 07/01/2017

## 2017-07-02 LAB — URINALYSIS W MICROSCOPIC + REFLEX CULTURE
BILIRUBIN URINE: NEGATIVE
Bacteria, UA: NONE SEEN [HPF]
CASTS: NONE SEEN [LPF]
Crystals: NONE SEEN [HPF]
GLUCOSE, UA: NEGATIVE
HGB URINE DIPSTICK: NEGATIVE
Ketones, ur: NEGATIVE
Leukocytes, UA: NEGATIVE
NITRITE: NEGATIVE
PH: 5 (ref 5.0–8.0)
Protein, ur: NEGATIVE
SPECIFIC GRAVITY, URINE: 1.027 (ref 1.001–1.035)
SQUAMOUS EPITHELIAL / LPF: NONE SEEN [HPF] (ref <=5)
WBC UA: NONE SEEN WBC/HPF (ref <=5)
Yeast: NONE SEEN [HPF]

## 2017-07-03 ENCOUNTER — Encounter: Payer: Self-pay | Admitting: Women's Health

## 2017-07-03 LAB — URINE CULTURE: Organism ID, Bacteria: NO GROWTH

## 2017-07-04 ENCOUNTER — Encounter: Payer: Self-pay | Admitting: Women's Health

## 2017-07-04 LAB — PAP, TP IMAGING W/ HPV RNA, RFLX HPV TYPE 16,18/45: HPV mRNA, High Risk: NOT DETECTED

## 2017-07-12 DIAGNOSIS — C182 Malignant neoplasm of ascending colon: Secondary | ICD-10-CM | POA: Diagnosis not present

## 2017-07-12 DIAGNOSIS — K8020 Calculus of gallbladder without cholecystitis without obstruction: Secondary | ICD-10-CM | POA: Diagnosis not present

## 2017-07-17 ENCOUNTER — Encounter (HOSPITAL_COMMUNITY): Payer: BLUE CROSS/BLUE SHIELD

## 2017-07-17 ENCOUNTER — Telehealth: Payer: Self-pay

## 2017-07-17 ENCOUNTER — Other Ambulatory Visit: Payer: Self-pay | Admitting: Women's Health

## 2017-07-17 MED ORDER — HYDROCORTISONE ACETATE 25 MG RE SUPP: 25.0000 mg | Freq: Two times a day (BID) | RECTAL | 0 refills | Status: DC

## 2017-07-17 MED ORDER — HYDROCORTISONE ACE-PRAMOXINE 2.5-1 % RE CREA: 1.0000 "application " | TOPICAL_CREAM | Freq: Three times a day (TID) | RECTAL | 1 refills | Status: DC

## 2017-07-17 NOTE — Telephone Encounter (Signed)
Rx sent 

## 2017-07-17 NOTE — Telephone Encounter (Signed)
Note from pharmacy that the Hydrocort-Pramoxine 2.5-1% cream you prescribed for her is not covered by her ins and will cost her $132.00.  Proctosol HC is covered and only costs her $15. Ok to change Rx to more cost efficient one?

## 2017-07-18 NOTE — Patient Instructions (Signed)
Shanieka MONIE SHERE  07/18/2017   Your procedure is scheduled on: 07-25-17  [facility] to 3rd floor to  [facility] at 7:30 AM.   Call this number if you have problems the morning of surgery [phone]    Remember: ONLY 1 PERSON MAY GO WITH YOU TO SHORT STAY TO GET  READY MORNING OF [place].  Please eat a Clear Liquid Day the Day of Prep. Do not eat food or drink liquids :After Midnight.    CLEAR LIQUID DIET   Foods Allowed                                                                     Foods Excluded  Coffee and tea, regular and decaf                             liquids that you cannot  Plain Jell-O in any flavor                                             see through such as: Fruit ices (not with fruit pulp)                                     milk, soups, orange juice  Iced Popsicles                                    All solid food Carbonated beverages, regular and diet                                    Cranberry, grape and apple juices Sports drinks like Gatorade Lightly seasoned clear broth or consume(fat free) Sugar, honey syrup  Sample Menu Breakfast                                Lunch                                     Supper Cranberry juice                    Beef broth                            Chicken broth Jell-O                                     Grape juice  Apple juice Coffee or tea                        Jell-O                                      Popsicle                                                Coffee or tea                        Coffee or tea  _____________________________________________________________________     Take these medicines the morning of surgery with A SIP OF WATER: None                                You may not have any metal on your body including hair pins and              piercings  Do not wear jewelry, make-up, lotions,  powders or perfumes, deodorant             Do not wear nail polish.  Do not shave  48 hours prior to surgery.               Do not bring valuables to the hospital. [place].  Contacts, dentures or bridgework may not be worn into surgery.  Leave suitcase in the car. After surgery it may be brought to your room.                Please read over the following fact sheets you were given: _____________________________________________________________________             [facility] - Preparing for Surgery Before surgery, you can play an important role.  Because skin is not sterile, your skin needs to be as free of germs as possible.  You can reduce the number of germs on your skin by washing with CHG (chlorahexidine gluconate) soap before surgery.  CHG is an antiseptic cleaner which kills germs and bonds with the skin to continue killing germs even after washing. Please DO NOT use if you have an allergy to CHG or antibacterial soaps.  If your skin becomes reddened/irritated stop using the CHG and inform your nurse when you arrive at Short Stay. Do not shave (including legs and underarms) for at least 48 hours prior to the first CHG shower.  You may shave your face/neck. Please follow these instructions carefully:  1.  Shower with CHG Soap the night before surgery and the  morning of Surgery.  2.  If you choose to wash your hair, wash your hair first as usual with your  normal  shampoo.  3.  After you shampoo, rinse your hair and body thoroughly to remove the  shampoo.                           4.  Use CHG as you would any other liquid soap.  You can apply chg directly  to the skin and wash                       Gently with a scrungie or clean washcloth.  5.  Apply the CHG Soap to your body ONLY FROM THE NECK DOWN.   Do not use on face/ open                           Wound or open sores. Avoid contact with eyes, ears mouth and genitals (private  parts).                       Wash face,  Genitals (private parts) with your normal soap.             6.  Wash thoroughly, paying special attention to the area where your surgery  will be performed.  7.  Thoroughly rinse your body with warm water from the neck down.  8.  DO NOT shower/wash with your normal soap after using and rinsing off  the CHG Soap.                9.  Pat yourself dry with a clean towel.            10.  Wear clean pajamas.            11.  Place clean sheets on your bed the night of your first shower and do not  sleep with pets. Day of Surgery : Do not apply any lotions/deodorants the morning of surgery.  Please wear clean clothes to the hospital/surgery center.  FAILURE TO FOLLOW THESE INSTRUCTIONS MAY RESULT IN THE CANCELLATION OF YOUR SURGERY PATIENT SIGNATURE_________________________________  NURSE SIGNATURE__________________________________  ________________________________________________________________________

## 2017-07-19 ENCOUNTER — Encounter (HOSPITAL_COMMUNITY): Payer: Self-pay

## 2017-07-19 ENCOUNTER — Telehealth: Payer: Self-pay | Admitting: Internal Medicine

## 2017-07-19 ENCOUNTER — Other Ambulatory Visit: Payer: Self-pay | Admitting: *Deleted

## 2017-07-19 ENCOUNTER — Encounter (HOSPITAL_COMMUNITY)
Admission: RE | Admit: 2017-07-19 | Discharge: 2017-07-19 | Disposition: A | Discharge status: Home / Self Care | Payer: BLUE CROSS/BLUE SHIELD | Source: Ambulatory Visit | Attending: General Surgery | Admitting: General Surgery

## 2017-07-19 ENCOUNTER — Telehealth: Payer: Self-pay | Admitting: *Deleted

## 2017-07-19 DIAGNOSIS — Z01818 Encounter for other preprocedural examination: Secondary | ICD-10-CM | POA: Diagnosis not present

## 2017-07-19 DIAGNOSIS — C189 Malignant neoplasm of colon, unspecified: Secondary | ICD-10-CM | POA: Insufficient documentation

## 2017-07-19 DIAGNOSIS — K8080 Other cholelithiasis without obstruction: Secondary | ICD-10-CM | POA: Insufficient documentation

## 2017-07-19 DIAGNOSIS — C182 Malignant neoplasm of ascending colon: Secondary | ICD-10-CM

## 2017-07-19 LAB — CBC WITH DIFFERENTIAL/PLATELET
Basophils Absolute: 0 10*3/uL (ref 0.0–0.1)
Basophils Relative: 0 %
EOS PCT: 5 %
Eosinophils Absolute: 0.4 10*3/uL (ref 0.0–0.7)
HEMATOCRIT: 34.1 % — AB (ref 36.0–46.0)
HEMOGLOBIN: 10.8 g/dL — AB (ref 12.0–15.0)
LYMPHS ABS: 2.4 10*3/uL (ref 0.7–4.0)
LYMPHS PCT: 32 %
MCH: 25.5 pg — AB (ref 26.0–34.0)
MCHC: 31.7 g/dL (ref 30.0–36.0)
MCV: 80.4 fL (ref 78.0–100.0)
Monocytes Absolute: 0.7 10*3/uL (ref 0.1–1.0)
Monocytes Relative: 9 %
NEUTROS ABS: 4 10*3/uL (ref 1.7–7.7)
Neutrophils Relative %: 54 %
PLATELETS: 394 10*3/uL (ref 150–400)
RBC: 4.24 MIL/uL (ref 3.87–5.11)
RDW: 16.8 % — ABNORMAL HIGH (ref 11.5–15.5)
WBC: 7.5 10*3/uL (ref 4.0–10.5)

## 2017-07-19 NOTE — Telephone Encounter (Signed)
Ok,  Make genetics referral for after surgery

## 2017-07-19 NOTE — Telephone Encounter (Signed)
Reviewed fundic gland polyps with her satisfied

## 2017-07-19 NOTE — Telephone Encounter (Signed)
Please call patient, she has questions on her gastric polyps. She is scheduled for her partial colectomy on 07/25/17. [facility] # [phone].

## 2017-07-19 NOTE — Telephone Encounter (Signed)
Message from pt to update Dr. Benay Spice on family history. Her great-uncle on father's side died of colon cancer in his 69's. Recently found out she has history of colon cancer on both sides of her family. (Grandfather on mother's side had colon cancer in his 5's). Pt is scheduled for surgery on 8/9.

## 2017-07-19 NOTE — Progress Notes (Signed)
06-14-17 (EPIC) CXR

## 2017-07-20 LAB — ABO/RH: ABO/RH(D): O POS

## 2017-07-20 LAB — HEMOGLOBIN A1C
Hgb A1c MFr Bld: 5.6 % (ref 4.8–5.6)
MEAN PLASMA GLUCOSE: 114 mg/dL

## 2017-07-24 MED ORDER — GENTAMICIN SULFATE 40 MG/ML IJ SOLN
INTRAVENOUS | Status: AC
  Administered 2017-07-25: 100 mL via INTRAVENOUS
  Filled 2017-07-24: qty 8

## 2017-07-25 ENCOUNTER — Inpatient Hospital Stay (HOSPITAL_COMMUNITY)
Admission: RE | Admit: 2017-07-25 | Discharge: 2017-07-28 | DRG: 331 | Disposition: A | Discharge status: Home / Self Care | Payer: BLUE CROSS/BLUE SHIELD | Source: Ambulatory Visit | Attending: General Surgery | Admitting: General Surgery

## 2017-07-25 ENCOUNTER — Encounter (HOSPITAL_COMMUNITY)
Admission: RE | Disposition: A | Discharge status: Home / Self Care | Payer: Self-pay | Source: Ambulatory Visit | Attending: General Surgery

## 2017-07-25 ENCOUNTER — Inpatient Hospital Stay (HOSPITAL_COMMUNITY): Payer: BLUE CROSS/BLUE SHIELD | Admitting: Anesthesiology

## 2017-07-25 ENCOUNTER — Encounter (HOSPITAL_COMMUNITY): Payer: Self-pay | Admitting: *Deleted

## 2017-07-25 DIAGNOSIS — Z79899 Other long term (current) drug therapy: Secondary | ICD-10-CM

## 2017-07-25 DIAGNOSIS — F419 Anxiety disorder, unspecified: Secondary | ICD-10-CM | POA: Diagnosis present

## 2017-07-25 DIAGNOSIS — K8010 Calculus of gallbladder with chronic cholecystitis without obstruction: Secondary | ICD-10-CM | POA: Diagnosis not present

## 2017-07-25 DIAGNOSIS — Z881 Allergy status to other antibiotic agents status: Secondary | ICD-10-CM

## 2017-07-25 DIAGNOSIS — C182 Malignant neoplasm of ascending colon: Principal | ICD-10-CM | POA: Diagnosis present

## 2017-07-25 DIAGNOSIS — C189 Malignant neoplasm of colon, unspecified: Secondary | ICD-10-CM | POA: Diagnosis not present

## 2017-07-25 DIAGNOSIS — K219 Gastro-esophageal reflux disease without esophagitis: Secondary | ICD-10-CM | POA: Diagnosis present

## 2017-07-25 DIAGNOSIS — D649 Anemia, unspecified: Secondary | ICD-10-CM | POA: Diagnosis not present

## 2017-07-25 DIAGNOSIS — Z87891 Personal history of nicotine dependence: Secondary | ICD-10-CM | POA: Diagnosis not present

## 2017-07-25 DIAGNOSIS — K8020 Calculus of gallbladder without cholecystitis without obstruction: Secondary | ICD-10-CM | POA: Diagnosis present

## 2017-07-25 DIAGNOSIS — C180 Malignant neoplasm of cecum: Secondary | ICD-10-CM | POA: Diagnosis not present

## 2017-07-25 HISTORY — PX: LAPAROSCOPIC PARTIAL COLECTOMY: SHX5907

## 2017-07-25 HISTORY — PX: CHOLECYSTECTOMY: SHX55

## 2017-07-25 LAB — TYPE AND SCREEN
ABO/RH(D): O POS
ANTIBODY SCREEN: NEGATIVE

## 2017-07-25 LAB — PREGNANCY, URINE: PREG TEST UR: NEGATIVE

## 2017-07-25 SURGERY — LAPAROSCOPIC PARTIAL COLECTOMY
Anesthesia: General

## 2017-07-25 MED ORDER — PANTOPRAZOLE SODIUM 40 MG IV SOLR
40.0000 mg | INTRAVENOUS | Status: DC
  Administered 2017-07-25 – 2017-07-26 (×2): 40 mg via INTRAVENOUS
  Filled 2017-07-25 (×2): qty 40

## 2017-07-25 MED ORDER — ALVIMOPAN 12 MG PO CAPS
12.0000 mg | ORAL_CAPSULE | ORAL | Status: AC
  Administered 2017-07-25: 12 mg via ORAL
  Filled 2017-07-25: qty 1

## 2017-07-25 MED ORDER — HYDROMORPHONE HCL-NACL 0.5-0.9 MG/ML-% IV SOSY
PREFILLED_SYRINGE | INTRAVENOUS | Status: DC
Start: 2017-07-25 — End: 2017-07-25
  Filled 2017-07-25: qty 2

## 2017-07-25 MED ORDER — METHOCARBAMOL 1000 MG/10ML IJ SOLN
500.0000 mg | Freq: Three times a day (TID) | INTRAVENOUS | Status: DC
  Administered 2017-07-25 – 2017-07-28 (×8): 500 mg via INTRAVENOUS
  Filled 2017-07-25 (×2): qty 550
  Filled 2017-07-25 (×3): qty 5
  Filled 2017-07-25: qty 550
  Filled 2017-07-25: qty 5
  Filled 2017-07-25 (×3): qty 550

## 2017-07-25 MED ORDER — MIDAZOLAM HCL 2 MG/2ML IJ SOLN
INTRAMUSCULAR | Status: DC | PRN
  Administered 2017-07-25: 2 mg via INTRAVENOUS

## 2017-07-25 MED ORDER — ROCURONIUM BROMIDE 10 MG/ML (PF) SYRINGE
PREFILLED_SYRINGE | INTRAVENOUS | Status: DC | PRN
  Administered 2017-07-25: 10 mg via INTRAVENOUS
  Administered 2017-07-25: 20 mg via INTRAVENOUS
  Administered 2017-07-25: 50 mg via INTRAVENOUS

## 2017-07-25 MED ORDER — HYDROMORPHONE HCL 2 MG/ML IJ SOLN
INTRAMUSCULAR | Status: AC
  Filled 2017-07-25: qty 1

## 2017-07-25 MED ORDER — SODIUM CHLORIDE 0.9 % IJ SOLN
INTRAMUSCULAR | Status: AC
  Filled 2017-07-25: qty 10

## 2017-07-25 MED ORDER — MORPHINE SULFATE 2 MG/ML IV SOLN
INTRAVENOUS | Status: DC
  Administered 2017-07-25: 3 mg via INTRAVENOUS
  Administered 2017-07-25: 13:00:00 via INTRAVENOUS
  Administered 2017-07-25: 1 mg via INTRAVENOUS
  Administered 2017-07-26 (×2): 0 mg via INTRAVENOUS
  Administered 2017-07-26: 3.5 mg via INTRAVENOUS
  Administered 2017-07-26: 2 mg via INTRAVENOUS
  Filled 2017-07-25: qty 30

## 2017-07-25 MED ORDER — SUFENTANIL CITRATE 50 MCG/ML IV SOLN
INTRAVENOUS | Status: DC | PRN
  Administered 2017-07-25 (×2): 10 ug via INTRAVENOUS
  Administered 2017-07-25: 20 ug via INTRAVENOUS
  Administered 2017-07-25: 10 ug via INTRAVENOUS

## 2017-07-25 MED ORDER — LACTATED RINGERS IV SOLN
INTRAVENOUS | Status: AC | PRN
  Administered 2017-07-25: 1000 mL

## 2017-07-25 MED ORDER — 0.9 % SODIUM CHLORIDE (POUR BTL) OPTIME
TOPICAL | Status: DC | PRN
  Administered 2017-07-25: 5000 mL

## 2017-07-25 MED ORDER — SUGAMMADEX SODIUM 200 MG/2ML IV SOLN
INTRAVENOUS | Status: AC
  Filled 2017-07-25: qty 2

## 2017-07-25 MED ORDER — ROCURONIUM BROMIDE 50 MG/5ML IV SOSY
PREFILLED_SYRINGE | INTRAVENOUS | Status: AC
  Filled 2017-07-25: qty 5

## 2017-07-25 MED ORDER — LIP MEDEX EX OINT
TOPICAL_OINTMENT | CUTANEOUS | Status: AC
  Filled 2017-07-25: qty 7

## 2017-07-25 MED ORDER — DIPHENHYDRAMINE HCL 12.5 MG/5ML PO ELIX: 12.5000 mg | ORAL_SOLUTION | Freq: Four times a day (QID) | ORAL | Status: DC | PRN

## 2017-07-25 MED ORDER — ONDANSETRON HCL 4 MG/2ML IJ SOLN
INTRAMUSCULAR | Status: AC
  Filled 2017-07-25: qty 2

## 2017-07-25 MED ORDER — SUFENTANIL CITRATE 50 MCG/ML IV SOLN
INTRAVENOUS | Status: AC
  Filled 2017-07-25: qty 1

## 2017-07-25 MED ORDER — ONDANSETRON HCL 4 MG/2ML IJ SOLN: 4.0000 mg | INTRAMUSCULAR | Status: DC | PRN

## 2017-07-25 MED ORDER — BUPIVACAINE HCL (PF) 0.5 % IJ SOLN
INTRAMUSCULAR | Status: DC | PRN
  Administered 2017-07-25: 30 mL

## 2017-07-25 MED ORDER — ALVIMOPAN 12 MG PO CAPS
12.0000 mg | ORAL_CAPSULE | Freq: Two times a day (BID) | ORAL | Status: DC
  Administered 2017-07-26 (×2): 12 mg via ORAL
  Filled 2017-07-25 (×2): qty 1

## 2017-07-25 MED ORDER — DIPHENHYDRAMINE HCL 50 MG/ML IJ SOLN: 12.5000 mg | Freq: Four times a day (QID) | INTRAMUSCULAR | Status: DC | PRN

## 2017-07-25 MED ORDER — LACTATED RINGERS IV SOLN
INTRAVENOUS | Status: DC
  Administered 2017-07-25 (×2): via INTRAVENOUS

## 2017-07-25 MED ORDER — LIDOCAINE 2% (20 MG/ML) 5 ML SYRINGE
INTRAMUSCULAR | Status: AC
  Filled 2017-07-25: qty 5

## 2017-07-25 MED ORDER — KCL IN DEXTROSE-NACL 20-5-0.9 MEQ/L-%-% IV SOLN
INTRAVENOUS | Status: DC
  Administered 2017-07-25: 15:00:00 via INTRAVENOUS
  Administered 2017-07-26: 125 mL/h via INTRAVENOUS
  Administered 2017-07-26 (×2): via INTRAVENOUS
  Filled 2017-07-25 (×7): qty 1000

## 2017-07-25 MED ORDER — OXYCODONE HCL 5 MG PO TABS: 5.0000 mg | ORAL_TABLET | Freq: Once | ORAL | Status: DC | PRN

## 2017-07-25 MED ORDER — SUGAMMADEX SODIUM 200 MG/2ML IV SOLN
INTRAVENOUS | Status: DC | PRN
  Administered 2017-07-25: 160 mg via INTRAVENOUS

## 2017-07-25 MED ORDER — NALOXONE HCL 0.4 MG/ML IJ SOLN: 0.4000 mg | INTRAMUSCULAR | Status: DC | PRN

## 2017-07-25 MED ORDER — LIDOCAINE 2% (20 MG/ML) 5 ML SYRINGE
INTRAMUSCULAR | Status: DC | PRN
  Administered 2017-07-25: 100 mg via INTRAVENOUS

## 2017-07-25 MED ORDER — IOPAMIDOL (ISOVUE-300) INJECTION 61%
INTRAVENOUS | Status: AC
  Filled 2017-07-25: qty 50

## 2017-07-25 MED ORDER — DEXAMETHASONE SODIUM PHOSPHATE 10 MG/ML IJ SOLN
INTRAMUSCULAR | Status: DC | PRN
  Administered 2017-07-25: 10 mg via INTRAVENOUS

## 2017-07-25 MED ORDER — ONDANSETRON HCL 4 MG PO TABS: 4.0000 mg | ORAL_TABLET | Freq: Four times a day (QID) | ORAL | Status: DC | PRN

## 2017-07-25 MED ORDER — SUCCINYLCHOLINE CHLORIDE 200 MG/10ML IV SOSY
PREFILLED_SYRINGE | INTRAVENOUS | Status: AC
  Filled 2017-07-25: qty 10

## 2017-07-25 MED ORDER — HYDROMORPHONE HCL 1 MG/ML IJ SOLN
INTRAMUSCULAR | Status: DC | PRN
  Administered 2017-07-25 (×2): 0.5 mg via INTRAVENOUS

## 2017-07-25 MED ORDER — PROPOFOL 10 MG/ML IV BOLUS
INTRAVENOUS | Status: DC | PRN
  Administered 2017-07-25: 150 mg via INTRAVENOUS

## 2017-07-25 MED ORDER — ENOXAPARIN SODIUM 40 MG/0.4ML ~~LOC~~ SOLN
40.0000 mg | SUBCUTANEOUS | Status: DC
  Administered 2017-07-26 – 2017-07-27 (×2): 40 mg via SUBCUTANEOUS
  Filled 2017-07-25 (×3): qty 0.4

## 2017-07-25 MED ORDER — ONDANSETRON HCL 4 MG/2ML IJ SOLN
4.0000 mg | Freq: Four times a day (QID) | INTRAMUSCULAR | Status: DC | PRN
  Administered 2017-07-25: 4 mg via INTRAVENOUS
  Filled 2017-07-25: qty 2

## 2017-07-25 MED ORDER — ONDANSETRON HCL 4 MG/2ML IJ SOLN
INTRAMUSCULAR | Status: DC | PRN
  Administered 2017-07-25: 4 mg via INTRAVENOUS

## 2017-07-25 MED ORDER — CHLORHEXIDINE GLUCONATE CLOTH 2 % EX PADS: 6.0000 | MEDICATED_PAD | Freq: Once | CUTANEOUS | Status: DC

## 2017-07-25 MED ORDER — HYDROMORPHONE HCL-NACL 0.5-0.9 MG/ML-% IV SOSY: 0.2500 mg | PREFILLED_SYRINGE | INTRAVENOUS | Status: DC | PRN

## 2017-07-25 MED ORDER — BUPIVACAINE HCL (PF) 0.5 % IJ SOLN
INTRAMUSCULAR | Status: AC
  Filled 2017-07-25: qty 30

## 2017-07-25 MED ORDER — SODIUM CHLORIDE 0.9% FLUSH: 9.0000 mL | INTRAVENOUS | Status: DC | PRN

## 2017-07-25 MED ORDER — DEXAMETHASONE SODIUM PHOSPHATE 10 MG/ML IJ SOLN
INTRAMUSCULAR | Status: AC
  Filled 2017-07-25: qty 1

## 2017-07-25 MED ORDER — MIDAZOLAM HCL 2 MG/2ML IJ SOLN
INTRAMUSCULAR | Status: AC
  Filled 2017-07-25: qty 2

## 2017-07-25 MED ORDER — OXYCODONE HCL 5 MG/5ML PO SOLN
5.0000 mg | Freq: Once | ORAL | Status: DC | PRN
  Filled 2017-07-25: qty 5

## 2017-07-25 MED ORDER — PROPOFOL 10 MG/ML IV BOLUS
INTRAVENOUS | Status: AC
  Filled 2017-07-25: qty 20

## 2017-07-25 SURGICAL SUPPLY — 85 items
APPLICATOR COTTON TIP 6IN STRL (MISCELLANEOUS) ×2 IMPLANT
APPLIER CLIP 5 13 M/L LIGAMAX5 (MISCELLANEOUS) ×2
APPLIER CLIP ROT 10 11.4 M/L (STAPLE)
BLADE EXTENDED COATED 6.5IN (ELECTRODE) IMPLANT
BLADE HEX COATED 2.75 (ELECTRODE) ×2 IMPLANT
CABLE HIGH FREQUENCY MONO STRZ (ELECTRODE) ×2 IMPLANT
CELLS DAT CNTRL 66122 CELL SVR (MISCELLANEOUS) IMPLANT
CLIP APPLIE 5 13 M/L LIGAMAX5 (MISCELLANEOUS) ×1 IMPLANT
CLIP APPLIE ROT 10 11.4 M/L (STAPLE) IMPLANT
COUNTER NEEDLE 20 DBL MAG RED (NEEDLE) ×2 IMPLANT
COVER MAYO STAND STRL (DRAPES) ×6 IMPLANT
DECANTER SPIKE VIAL GLASS SM (MISCELLANEOUS) ×2 IMPLANT
DISSECTOR BLUNT TIP ENDO 5MM (MISCELLANEOUS) IMPLANT
DRAIN CHANNEL 19F RND (DRAIN) IMPLANT
DRAPE LAPAROSCOPIC ABDOMINAL (DRAPES) ×2 IMPLANT
DRAPE SURG IRRIG POUCH 19X23 (DRAPES) ×2 IMPLANT
DRAPE WARM FLUID 44X44 (DRAPE) ×2 IMPLANT
DRSG OPSITE POSTOP 4X10 (GAUZE/BANDAGES/DRESSINGS) IMPLANT
DRSG OPSITE POSTOP 4X6 (GAUZE/BANDAGES/DRESSINGS) ×2 IMPLANT
DRSG OPSITE POSTOP 4X8 (GAUZE/BANDAGES/DRESSINGS) IMPLANT
ELECT REM PT RETURN 15FT ADLT (MISCELLANEOUS) ×2 IMPLANT
EVACUATOR SILICONE 100CC (DRAIN) IMPLANT
FILTER SMOKE EVAC LAPAROSHD (FILTER) IMPLANT
GAUZE SPONGE 4X4 12PLY STRL (GAUZE/BANDAGES/DRESSINGS) ×2 IMPLANT
GLOVE BIO SURGEON STRL SZ 6.5 (GLOVE) ×2 IMPLANT
GLOVE BIOGEL PI IND STRL 6.5 (GLOVE) ×1 IMPLANT
GLOVE BIOGEL PI IND STRL 7.0 (GLOVE) ×2 IMPLANT
GLOVE BIOGEL PI IND STRL 7.5 (GLOVE) ×2 IMPLANT
GLOVE BIOGEL PI INDICATOR 6.5 (GLOVE) ×1
GLOVE BIOGEL PI INDICATOR 7.0 (GLOVE) ×2
GLOVE BIOGEL PI INDICATOR 7.5 (GLOVE) ×2
GLOVE ECLIPSE 8.0 STRL XLNG CF (GLOVE) ×4 IMPLANT
GLOVE INDICATOR 8.0 STRL GRN (GLOVE) ×4 IMPLANT
GLOVE SURG SIGNA 7.5 PF LTX (GLOVE) ×2 IMPLANT
GOWN STRL REUS W/TWL LRG LVL3 (GOWN DISPOSABLE) ×2 IMPLANT
GOWN STRL REUS W/TWL XL LVL3 (GOWN DISPOSABLE) ×6 IMPLANT
HANDLE SUCTION POOLE (INSTRUMENTS) ×1 IMPLANT
HOLDER FOLEY CATH W/STRAP (MISCELLANEOUS) ×2 IMPLANT
IRRIG SUCT STRYKERFLOW 2 WTIP (MISCELLANEOUS) ×2
IRRIGATION SUCT STRKRFLW 2 WTP (MISCELLANEOUS) ×1 IMPLANT
KIT BASIN OR (CUSTOM PROCEDURE TRAY) ×2 IMPLANT
LEGGING LITHOTOMY PAIR STRL (DRAPES) ×2 IMPLANT
LIGASURE IMPACT 36 18CM CVD LR (INSTRUMENTS) ×2 IMPLANT
PACK COLON (CUSTOM PROCEDURE TRAY) ×2 IMPLANT
PACK GENERAL/GYN (CUSTOM PROCEDURE TRAY) ×2 IMPLANT
PAD POSITIONING PINK XL (MISCELLANEOUS) ×2 IMPLANT
PORT LAP GEL ALEXIS MED 5-9CM (MISCELLANEOUS) ×2 IMPLANT
RELOAD PROXIMATE 75MM BLUE (ENDOMECHANICALS) ×2 IMPLANT
RTRCTR WOUND ALEXIS 18CM MED (MISCELLANEOUS)
SCISSORS LAP 5X35 DISP (ENDOMECHANICALS) ×2 IMPLANT
SEALER TISSUE X1 CVD JAW (INSTRUMENTS) IMPLANT
SHEARS HARMONIC ACE PLUS 36CM (ENDOMECHANICALS) ×2 IMPLANT
SLEEVE XCEL OPT CAN 5 100 (ENDOMECHANICALS) ×8 IMPLANT
SPONGE DRAIN TRACH 4X4 STRL 2S (GAUZE/BANDAGES/DRESSINGS) IMPLANT
SPONGE LAP 18X18 X RAY DECT (DISPOSABLE) IMPLANT
STAPLER GUN LINEAR PROX 60 (STAPLE) ×2 IMPLANT
STAPLER PROXIMATE 75MM BLUE (STAPLE) ×2 IMPLANT
STAPLER VISISTAT 35W (STAPLE) ×2 IMPLANT
STRIP CLOSURE SKIN 1/2X4 (GAUZE/BANDAGES/DRESSINGS) ×2 IMPLANT
SUCTION POOLE HANDLE (INSTRUMENTS) ×2
SUT ETHILON 3 0 PS 1 (SUTURE) IMPLANT
SUT MNCRL AB 4-0 PS2 18 (SUTURE) ×2 IMPLANT
SUT PDS AB 1 CTX 36 (SUTURE) ×4 IMPLANT
SUT PDS AB 1 TP1 96 (SUTURE) IMPLANT
SUT PROLENE 2 0 SH DA (SUTURE) ×2 IMPLANT
SUT SILK 2 0 (SUTURE) ×1
SUT SILK 2 0 SH CR/8 (SUTURE) ×2 IMPLANT
SUT SILK 2-0 18XBRD TIE 12 (SUTURE) ×1 IMPLANT
SUT SILK 3 0 (SUTURE) ×1
SUT SILK 3 0 SH CR/8 (SUTURE) ×4 IMPLANT
SUT SILK 3-0 18XBRD TIE 12 (SUTURE) ×1 IMPLANT
SUT VICRYL 2 0 18  UND BR (SUTURE) ×1
SUT VICRYL 2 0 18 UND BR (SUTURE) ×1 IMPLANT
SYS LAPSCP GELPORT 120MM (MISCELLANEOUS)
SYSTEM LAPSCP GELPORT 120MM (MISCELLANEOUS) IMPLANT
TOWEL OR 17X26 10 PK STRL BLUE (TOWEL DISPOSABLE) ×2 IMPLANT
TOWEL OR NON WOVEN STRL DISP B (DISPOSABLE) ×2 IMPLANT
TRAY FOLEY BAG SILVER LF 14FR (CATHETERS) IMPLANT
TRAY FOLEY W/METER SILVER 14FR (SET/KITS/TRAYS/PACK) ×2 IMPLANT
TROCAR BLADELESS OPT 5 100 (ENDOMECHANICALS) ×4 IMPLANT
TROCAR XCEL BLUNT TIP 100MML (ENDOMECHANICALS) IMPLANT
TROCAR XCEL NON-BLD 11X100MML (ENDOMECHANICALS) IMPLANT
TUBING INSUF HEATED (TUBING) ×4 IMPLANT
TUBING IRRIGATION (MISCELLANEOUS) ×4 IMPLANT
YANKAUER SUCT BULB TIP NO VENT (SUCTIONS) ×2 IMPLANT

## 2017-07-25 NOTE — H&P (Signed)
Savannah Cook 06/18/2017 3:34 PM Location: [facility] Patient #: [id number] DOB: [birth year] Single / Language: Cleophus Cook / Race: White Female   History of Present Illness Odis Hollingshead MD; 06/18/2017 6:09 PM) The patient is a 51 year old female.  Note:She is referred by Dr. Carlean Purl for consultation regarding newly diagnosed adenocarcinoma of the right colon. She had some anemia and had heme positive stools. Colonoscopy was performed. A fungating mass was noted in the right colon near the cecum. Biopsy was positive for adenocarcinoma. CEA level is slightly elevated. Liver function tests are normal. Hemoglobin is 11.2. CT scan of abdomen and pelvis demonstrates the tumor (7 cm) as well as a large gallstone. Small slightly enlarged lymph nodes around the area of the mass. However there is no evidence of metastatic disease. Her grandfather had colon cancer at the age of 68. She does describe 2 attacks of what sound like biliary colic in the past.   .  I have reviewed the colonoscopy, pathology, and CT scans.  Diagnostic Studies History [facility] [place], [place]; 06/18/2017 3:34 PM) Colonoscopy  never Mammogram  within last year Pap Smear  1-5 years ago  Allergies  Ceclor *CEPHALOSPORINS*    Prior to Admission medications   Medication Sig Start Date End Date Taking? Authorizing Provider  AMBULATORY NON FORMULARY MEDICATION Cataplex C Take 1 tablet by mouth twice daily   Yes [provider]  AMBULATORY NON FORMULARY MEDICATION Clearvite shake As needed   Yes [provider]  Biotin 10000 MCG TABS Take 1 tablet by mouth daily.   Yes [provider]  Cholecalciferol (VITAMIN D-3) 5000 units TABS Take 5,000 Units by mouth daily.   Yes [provider]  Cyanocobalamin (B-12 PO) Take 1 tablet by mouth daily.   Yes [provider]  ferrous sulfate 325 (65 FE) MG tablet Take 325 mg by mouth daily.   Yes [provider]   hydrocortisone (ANUSOL-HC) 25 MG suppository Place 1 suppository (25 mg total) rectally 2 (two) times daily. 07/17/17  Yes Huel Cote, NP  OVER THE COUNTER MEDICATION Take 1 tablet by mouth daily. "hypothalamus pmg" supplement   Yes [provider]  Prenatal Vit-Fe Fumarate-FA (PRENATAL FORMULA PO) Take 1 tablet by mouth daily.   Yes [provider]  Probiotic Product (PROBIOTIC PO) Take 1 capsule by mouth 2 (two) times daily.   Yes [provider]     Social History Alcohol use  Occasional alcohol use. Caffeine use  Coffee, Tea. No drug use  Tobacco use  Former smoker.  Family History  Cerebrovascular Accident  Family Members In [place] Members In General. Diabetes Mellitus  Brother, Father, Mother, Sister. Melanoma  Mother. Prostate Cancer  Father. Respiratory Condition  Father, Mother.  Pregnancy / Birth History Age at menarche  79 years. Contraceptive History  Oral contraceptives. Gravida  2 Length (months) of breastfeeding  3-6 Maternal age  1-35 Para  1 Regular periods   Other Problems  Anxiety Disorder  Cholelithiasis  Diverticulosis  Gastroesophageal Reflux Disease    Physical Exam  The physical exam findings are as follows: Note:GENERAL APPEARANCE: Obese female in NAD. Pleasant and cooperative.  EARS, NOSE, MOUTH THROAT: [place]/AT external ears: no lesions or deformities external nose: no lesions or deformities hearing: grossly normal lips: moist, no deformities EYES external: conjunctiva, lids, sclerae normal pupils: equal, round   NECK: Supple, no obvious mass or thyroid mass/enlargement, no trachea deviation  CV ascultation: RRR, no  murmur extremity edema: no extremity varicosities: no  RESP/CHEST auscultation: breath sounds equal and clear respiratory effort: normal   GASTROINTESTINAL abdomen: Soft, non-tender, non-distended, no masses liver and spleen: not  enlarged. hernia: none present scar: none present  MUSCULOSKELETAL station and gait: normal digits/nails: no clubbing or cyanosis muscle strength: grossly normal all extremities deformities: none instability: none  SKIN jaundice: none rash or lesion: none  NEUROLOGIC speech: normal  PSYCHIATRIC alertness and orientation: normal mood/affect/behavior: normal judgement and insight: normal    Assessment & Plan  CANCER OF RIGHT COLON (C18.2) Impression: No evidence of metastatic disease on CT scan. CEA level is slightly elevated. We discussed recommendations for treatment specifically surgical recommendation. We discussed staging and the fact that we would not know the stage until the surgery has been done and the pathologist has looked at everything.  Plan: I recommended laparoscopic partial colectomy. If the operation goes well, we can proceed with laparoscopic cholecystectomy at the same time. I have explained the procedure and risks of colon resection. Risks include but are not limited to bleeding, infection, wound problems, anesthesia, anastomotic leak, need for colostomy, need for reoperative surgery, injury to intraabominal organs (such as bile duct, intestine, spleen, kidney, bladder, ureter, etc.), ileus, irregular bowel habits. She seems to understand and agrees to proceed.  Jackolyn Confer, M.D.

## 2017-07-25 NOTE — Anesthesia Preprocedure Evaluation (Signed)
Anesthesia Evaluation  Patient identified by MRN, date of birth, ID band Patient awake    Reviewed: Allergy & Precautions, NPO status , Patient's Chart, lab work & pertinent test results  History of Anesthesia Complications Negative for: history of anesthetic complications  Airway Mallampati: II  TM Distance: >3 FB Neck ROM: Full    Dental   Pulmonary former smoker,    breath sounds clear to auscultation       Cardiovascular negative cardio ROS   Rhythm:Regular Rate:Normal     Neuro/Psych    GI/Hepatic Neg liver ROS, As per surgery   Endo/Other  negative endocrine ROS  Renal/GU negative Renal ROS     Musculoskeletal negative musculoskeletal ROS (+)   Abdominal   Peds  Hematology negative hematology ROS (+)   Anesthesia Other Findings   Reproductive/Obstetrics                             Anesthesia Physical Anesthesia Plan  ASA: II  Anesthesia Plan: General   Post-op Pain Management:    Induction: Intravenous  PONV Risk Score and Plan: 4 or greater and Ondansetron, Dexamethasone, Midazolam, Scopolamine patch - Pre-op and Treatment may vary due to age or medical condition  Airway Management Planned: Oral ETT  Additional Equipment:   Intra-op Plan:   Post-operative Plan: Extubation in OR  Informed Consent: I have reviewed the patients History and Physical, chart, labs and discussed the procedure including the risks, benefits and alternatives for the proposed anesthesia with the patient or authorized representative who has indicated his/her understanding and acceptance.   Dental advisory given  Plan Discussed with: CRNA  Anesthesia Plan Comments:         Anesthesia Quick Evaluation

## 2017-07-25 NOTE — Transfer of Care (Signed)
Immediate Anesthesia Transfer of Care Note  Patient: Renisha M Sesma  Procedure(s) Performed: Procedure(s): LAPAROSCOPIC PARTIAL COLECTOMY (N/A) CHOLECYSTECTOMY (N/A)  Patient Location: PACU  Anesthesia Type:General  Level of Consciousness: Patient easily awoken, sedated, comfortable, cooperative, following commands, responds to stimulation.   Airway & Oxygen Therapy: Patient spontaneously breathing, ventilating well, oxygen via simple oxygen mask.  Post-op Assessment: Report given to PACU RN, vital signs reviewed and stable, moving all extremities.   Post vital signs: Reviewed and stable.  Complications: No apparent anesthesia complications Last Vitals:  Vitals:   07/25/17 0719  BP: (!) 143/85  Pulse: (!) 103  Resp: 16  Temp: 37.3 C  SpO2: 99%    Last Pain:  Vitals:   07/25/17 0719  TempSrc: Oral      Patients Stated Pain Goal: 3 (36/12/24 4975)  Complications: No apparent anesthesia complications

## 2017-07-25 NOTE — Anesthesia Postprocedure Evaluation (Signed)
Anesthesia Post Note  Patient: Savannah Cook  Procedure(s) Performed: Procedure(s) (LRB): LAPAROSCOPIC PARTIAL COLECTOMY (N/A) CHOLECYSTECTOMY (N/A)     Patient location during evaluation: PACU Anesthesia Type: General Level of consciousness: awake, sedated and oriented Pain management: pain level controlled Vital Signs Assessment: post-procedure vital signs reviewed and stable Respiratory status: spontaneous breathing, nonlabored ventilation, respiratory function stable and patient connected to nasal cannula oxygen Cardiovascular status: blood pressure returned to baseline and stable Postop Assessment: no signs of nausea or vomiting Anesthetic complications: no    Last Vitals:  Vitals:   07/25/17 1245 07/25/17 1300  BP: 125/86 128/72  Pulse: 71 62  Resp: 16 16  Temp:    SpO2: 100% 100%    Last Pain:  Vitals:   07/25/17 1300  TempSrc:   PainSc: Asleep                 Vontae Court,JAMES TERRILL

## 2017-07-25 NOTE — Anesthesia Procedure Notes (Signed)
Procedure Name: Intubation Date/Time: 07/25/2017 9:41 AM Performed by: Danley Danker L Patient Re-evaluated:Patient Re-evaluated prior to induction Oxygen Delivery Method: Circle system utilized Preoxygenation: Pre-oxygenation with 100% oxygen Induction Type: IV induction Ventilation: Mask ventilation without difficulty and Oral airway inserted - appropriate to patient size Laryngoscope Size: Miller and 2 Grade View: Grade I Tube type: Oral Tube size: 7.5 mm Number of attempts: 1 Airway Equipment and Method: Stylet Placement Confirmation: ETT inserted through vocal cords under direct vision,  positive ETCO2 and breath sounds checked- equal and bilateral Secured at: 21 cm Tube secured with: Tape Dental Injury: Teeth and Oropharynx as per pre-operative assessment

## 2017-07-25 NOTE — Op Note (Signed)
OPERATIVE NOTE-LAPAROSCOPIC ASSISTED RIGHT COLECTOMY. LAPAROSCOPIC CHOLECYSTECTOMY  Preop Dx:   Right colon cancer. 2. Symptomatic cholelithiasis  Postop Dx:  Same  Procedure:  Laparoscopic assisted right colectomy.  Laparoscopic cholecystectomy.  Surgeon:  Jackolyn Confer, M.D.  Asst:  Alphonsa Overall M.D.  Anesthesia:  General  EBL:  200 ml  Specimen:   Right colon and terminal ileum. Gallbladder.  Indication: This is a 51 year old female with anemia. Workup included a colonoscopy which demonstrated a fungating right colon mass. Biopsy was positive for adenocarcinoma. She also has a large gallstone on her staging scans and is intermittently symptomatic from this. CEA is slightly elevated. She now presents for the above procedures.  Procedure Detail:She was brought to the operating room placed supine on the operating table and a general anesthetic was administered. A Foley catheter was inserted. An oral gastric tube was inserted. Hair on the abdominal wall was clipped. The abdominal wall was widely sterilely prepped and draped.  She was placed in slight reverse Trendelenburg position. A 5 mm incision was made in the left subcostal area. Using a 5 mm Optiview trocar and laparoscope, access was gained to the peritoneal cavity and a pneumoperitoneum was created. Inspection of the area under the trocar demonstrated no evidence of bleeding or organ injury.  A 5 mm trocar was placed in the subumbilical region. A 5 mm trocar was placed in the left mid abdominal wall. The cecum was identified. The terminal ileum was mobile. The lateral attachments of the cecum and ascending colon were divided sharply and the ascending colon was medialized. I then mobilized the proximal half of the transverse colon with sharp and blunt dissection and using the Harmonic scalpel. The right colon and terminal ileum as well as the proximal transverse colon were able to be brought to side directly under the right mid  abdomen..  A limited right transverse incision through all layers was made.  A wound protection device was placed. I externalized the terminal ileum as well as the right colon and proximal half of the transverse colon. A small defect was placed in the mesentery adjacent to the ileum. Just proximal to the middle colic vessels, a small defect was placed in the mesentery adjacent to the colon. The terminal ileum  and the transverse were divided with the linear cutting stapler.Using a LigaSure device I then performed a wedge-shaped mesenteric resection.  The specimen was then handed off the field.  Next, an enterotomy and colotomy were made and a side-to-side anastomosis was created using a linear cutting stapler. Staple lines were hemostatic. A crotch stitch of 3-0 silk was placed at the distal anastomotic staple line. I then inverted the linear noncutting staple line with interrupted 3-0 silk sutures in a Lembert type fashion. The anastomosis was patent, viable, and under no tension. It was dropped back into the abdominal cavity. Abdominal cavity was irrigated with 1 L of irrigation.   A cap was placed on the wound protection device and a trocar inserted through it. A 5 mm trocar was then inserted into the right lateral abdomen.  Next, the gallbladder was approached. The fundus was grasped and retracted superiorly. Using blunt dissection on the gallbladder I mobilized the infundibulum and identified the cystic duct and cystic artery. Windows were created around both. The critical view was achieved. The cystic artery and cystic duct were then clipped and divided. Using Harmonic scalpel, the gallbladder was dissected free from the liver. Bleeding was controlled with electrocautery. The cap on the wound protection  device was removed and the gallbladder was removed. The wound protection device was removed and some of the trocars were removed.  Following this, gloves, gowns, and instruments were changed. Two more  liters of irrigation was used to irrigate out the abdominal cavity. There was no evidence of organ injury or bleeding. The fascia of the limited right transverse incision was then closed in 2 layers with running #1 PDS suture. Repeat laparoscopy was performed and a 4 quadrant as well as central inspection was performed. There was no evidence of bleeding or organ injury.  The fascial closure was solid. The pneumoperitoneum was released and the remaining trocar was removed.  The subcutaneous tissue of the extraction site incision was irrigated. The skin of this incision was closed with a combination of staples and benzoin and Steri-Strips. Trocar site incisions were closed with 4-0 Monocryl subcuticular stitches followed by benzoin and Steri-Strips. Sterile dressings were applied to all wounds.  She tolerated the procedure well without any apparent complications and was taken to the recovery room in satisfactory condition.  Needle, sponge, and instrument counts were reported to be correct before final closure.

## 2017-07-25 NOTE — Discharge Instructions (Addendum)
[facility], [place] [phone]  COLON SURGERY: POST OP INSTRUCTIONS  Always review your discharge instruction sheet given to you by the facility where your surgery was performed.  IF YOU HAVE DISABILITY OR FAMILY LEAVE FORMS, YOU MUST BRING THEM TO THE OFFICE FOR PROCESSING.  PLEASE DO NOT GIVE THEM TO YOUR DOCTOR.  1. A prescription for pain medication may be given to you upon discharge.  Take your pain medication as prescribed, if needed.  If narcotic pain medicine is not needed, then you may take acetaminophen (Tylenol) or ibuprofen (Advil) as needed. 2. Take your usually prescribed medications unless otherwise directed. 3. If you need a refill on your pain medication, please contact your pharmacy. They will contact our office to request authorization.  Prescriptions will not be filled after 5pm or on week-ends. 4. You should follow lean protein, bland diet.  Do not overeat.  Be sure to include lots of fluids daily. Most patients will experience some swelling and bruising in the area of the incision. Ice pack will help. Swelling and bruising can take several days to resolve..  5. It is common to experience some constipation if taking pain medication after surgery.  Increasing fluid intake and taking a stool softener will usually help or prevent this problem from occurring.  A mild laxative (Milk of Magnesia or Miralax) should be taken according to package directions if there are no bowel movements after 48 hours. 6.  You may have steri-strips (small skin tapes) in place directly over the incision.  These strips should be left on the skin.  If your surgeon used skin glue on the incision, you may shower in 24 hours.  The glue will flake off over the next 2-3 weeks.  Any sutures or staples will be removed at the office during your follow-up visit. You may find that a light gauze bandage over your incision may keep your staples from being rubbed or pulled. You may shower and replace  the bandage daily. 7. ACTIVITIES:  You may resume regular (light) daily activities beginning the next day--such as daily self-care, walking, climbing stairs--gradually increasing activities as tolerated.  You may have sexual intercourse when it is comfortable.  Refrain from any heavy lifting or straining for at least 6 weeks.  Do not lift anything over 10 pounds.  a. You may drive when you no longer are taking prescription pain medication, you can comfortably wear a seatbelt, and you can safely maneuver your car and apply brakes b. Return to Work: _When released to do so by doctor.__________________________________ 8. You should see your doctor in the office for a follow-up appointment approximately 2-3 weeks after your surgery.  Make sure that you call for this appointment within a day or two after you arrive home to insure a convenient appointment time. OTHER INSTRUCTIONS:  _____________________________________________________________ _____________________________________________________________  WHEN TO CALL YOUR DOCTOR: 1. Fever over 101.0 2. Inability to urinate 3. Nausea and/or vomiting 4. Extreme swelling or bruising 5. Continued bleeding from incision. 6. Increased pain, redness, or drainage from the incision.  The clinic staff is available to answer your questions during regular business hours.  Please dont hesitate to call and ask to speak to one of the nurses if you have concerns.  For further questions, please visit [email or web address]

## 2017-07-25 NOTE — Interval H&P Note (Signed)
History and Physical Interval Note:  07/25/2017 9:20 AM  Savannah Cook  has presented today for surgery, with the diagnosis of COLON CANCER  The various methods of treatment have been discussed with the patient and family. After consideration of risks, benefits and other options for treatment, the patient has consented to  Procedure(s): LAPAROSCOPIC PARTIAL COLECTOMY (N/A) CHOLECYSTECTOMY (N/A) as a surgical intervention .  The patient's history has been reviewed, patient examined, no change in status, stable for surgery.  I have reviewed the patient's chart and labs.  Questions were answered to the patient's satisfaction.     Dmitry Macomber Lenna Sciara

## 2017-07-26 ENCOUNTER — Encounter (HOSPITAL_COMMUNITY): Payer: Self-pay | Admitting: General Surgery

## 2017-07-26 LAB — CBC
HCT: 30.5 % — ABNORMAL LOW (ref 36.0–46.0)
Hemoglobin: 9.8 g/dL — ABNORMAL LOW (ref 12.0–15.0)
MCH: 25.7 pg — ABNORMAL LOW (ref 26.0–34.0)
MCHC: 32.1 g/dL (ref 30.0–36.0)
MCV: 79.8 fL (ref 78.0–100.0)
Platelets: 384 10*3/uL (ref 150–400)
RBC: 3.82 MIL/uL — ABNORMAL LOW (ref 3.87–5.11)
RDW: 16.4 % — ABNORMAL HIGH (ref 11.5–15.5)
WBC: 10 10*3/uL (ref 4.0–10.5)

## 2017-07-26 LAB — BASIC METABOLIC PANEL
Anion gap: 8 (ref 5–15)
BUN: 7 mg/dL (ref 6–20)
CALCIUM: 8.4 mg/dL — AB (ref 8.9–10.3)
CO2: 24 mmol/L (ref 22–32)
CREATININE: 0.66 mg/dL (ref 0.44–1.00)
Chloride: 106 mmol/L (ref 101–111)
GFR calc Af Amer: >60 mL/min (ref >60)
GLUCOSE: 138 mg/dL — AB (ref 65–99)
Potassium: 4 mmol/L (ref 3.5–5.1)
SODIUM: 138 mmol/L (ref 135–145)

## 2017-07-26 MED ORDER — KETOROLAC TROMETHAMINE 30 MG/ML IJ SOLN
30.0000 mg | Freq: Four times a day (QID) | INTRAMUSCULAR | Status: AC
  Administered 2017-07-26 – 2017-07-28 (×5): 30 mg via INTRAVENOUS
  Filled 2017-07-26 (×5): qty 1

## 2017-07-26 NOTE — Progress Notes (Signed)
Assessment Principal Problem:   Colon cancer [facility]) s/p laparoscopic right colectomy and cholecystectomy 07/25/17-doing better this AM.    ABL on chronic anemia   Plan:  Decrease IVF.  Remove foley tomorrow.  Start clear liquids.   LOS: 1 day     1 Day Post-Op  Chief Complaint/Subjective: Had some nausea and one episode of vomiting last night.  None this AM.  Has belched and walked.   Objective: Vital signs in last 24 hours: Temp:  [97.9 F (36.6 C)-100.1 F (37.8 C)] 97.9 F (36.6 C) (08/10 0511) Pulse Rate:  [43-84] 55 (08/10 0507) Resp:  [10-19] 16 (08/10 0507) BP: (110-135)/(60-86) 113/60 (08/10 0507) SpO2:  [99 %-100 %] 100 % (08/10 0507) FiO2 (%):  [37 %] 37 % (08/10 0400)    Intake/Output from previous day: 08/09 0701 - 08/10 0700 In: 3690 [I.V.:3525; IV Piggyback:165] Out: 1845 [Urine:1770; Blood:75] Intake/Output this shift: No intake/output data recorded.  PE: General- In NAD.  Awake and alert. Abdomen-soft, dressings dry, few bowel sounds  Lab Results:   Recent Labs  07/26/17 0535  WBC 10.0  HGB 9.8*  HCT 30.5*  PLT 384   BMET  Recent Labs  07/26/17 0535  NA 138  K 4.0  CL 106  CO2 24  GLUCOSE 138*  BUN 7  CREATININE 0.66  CALCIUM 8.4*   PT/INR No results for input(s): LABPROT, INR in the last 72 hours. Comprehensive Metabolic Panel:    Component Value Date/Time   NA 138 07/26/2017 0535   NA 137 06/10/2017 1214   K 4.0 07/26/2017 0535   K 3.8 06/10/2017 1214   CL 106 07/26/2017 0535   CL 105 06/10/2017 1214   CO2 24 07/26/2017 0535   CO2 25 06/10/2017 1214   BUN 7 07/26/2017 0535   BUN 9 06/10/2017 1214   CREATININE 0.66 07/26/2017 0535   CREATININE 0.72 06/10/2017 1214   GLUCOSE 138 (H) 07/26/2017 0535   GLUCOSE 98 06/10/2017 1214   CALCIUM 8.4 (L) 07/26/2017 0535   CALCIUM 9.0 06/10/2017 1214   AST 14 06/10/2017 1214   ALT 13 06/10/2017 1214   ALKPHOS 70 06/10/2017 1214   BILITOT 0.3 06/10/2017 1214   PROT 7.4  06/10/2017 1214   ALBUMIN 3.9 06/10/2017 1214     Studies/Results: No results found.  Anti-infectives: Anti-infectives    Start     Dose/Rate Route Frequency Ordered Stop   07/25/17 0815  gentamicin (GARAMYCIN) 320 mg, clindamycin (CLEOCIN) 900 mg in dextrose 5 % 100 mL IVPB     228 mL/hr over 30 Minutes Intravenous 60 min pre-op 07/24/17 1347 07/25/17 1045       Brycen Bean J 07/26/2017

## 2017-07-27 MED ORDER — MORPHINE SULFATE (PF) 2 MG/ML IV SOLN: 2.0000 mg | INTRAVENOUS | Status: DC | PRN

## 2017-07-27 MED ORDER — PANTOPRAZOLE SODIUM 40 MG PO TBEC
40.0000 mg | DELAYED_RELEASE_TABLET | Freq: Every day | ORAL | Status: DC
  Administered 2017-07-27: 40 mg via ORAL
  Filled 2017-07-27: qty 1

## 2017-07-27 MED ORDER — SIMETHICONE 80 MG PO CHEW
80.0000 mg | CHEWABLE_TABLET | Freq: Four times a day (QID) | ORAL | Status: DC | PRN
  Filled 2017-07-27: qty 1

## 2017-07-27 NOTE — Progress Notes (Signed)
Pt states she's having lots of abdominal gas pressure/pain. Is ambulating and belching, but no flatulence. She did have a BM this am. Paged Dr. Zella Richer to see if there's anything pt can have prn for gas pain. Will continue to monitor pt.

## 2017-07-27 NOTE — Progress Notes (Signed)
Assessment Principal Problem:   Colon cancer [facility]) s/p laparoscopic right colectomy and cholecystectomy 07/25/17-bowel function starting to slowly return.    ABL on chronic anemia   Plan:  Advance to full liquid diet.  D/C PCA.   LOS: 2 days     2 Days Post-Op  Chief Complaint/Subjective: Tolerated clear liquids.  Had one small liquid BM, no flatus. Adequate pain control.  Not using PCA.  Objective: Vital signs in last 24 hours: Temp:  [97.3 F (36.3 C)-99.5 F (37.5 C)] 98.5 F (36.9 C) (08/11 0458) Pulse Rate:  [48-66] 51 (08/11 0458) Resp:  [16-20] 16 (08/11 0458) BP: (121-139)/(68-82) 139/76 (08/11 0458) SpO2:  [97 %-100 %] 98 % (08/11 0458) Last BM Date: 07/27/17  Intake/Output from previous day: 08/10 0701 - 08/11 0700 In: 1055.8 [P.O.:120; I.V.:880.8; IV Piggyback:55] Out: 0086 [Urine:1350] Intake/Output this shift: No intake/output data recorded.  PE: General- In NAD.  Awake and alert. Abdomen-soft, dressings dry, few bowel sounds  Lab Results:   Recent Labs  07/26/17 0535  WBC 10.0  HGB 9.8*  HCT 30.5*  PLT 384   BMET  Recent Labs  07/26/17 0535  NA 138  K 4.0  CL 106  CO2 24  GLUCOSE 138*  BUN 7  CREATININE 0.66  CALCIUM 8.4*   PT/INR No results for input(s): LABPROT, INR in the last 72 hours. Comprehensive Metabolic Panel:    Component Value Date/Time   NA 138 07/26/2017 0535   NA 137 06/10/2017 1214   K 4.0 07/26/2017 0535   K 3.8 06/10/2017 1214   CL 106 07/26/2017 0535   CL 105 06/10/2017 1214   CO2 24 07/26/2017 0535   CO2 25 06/10/2017 1214   BUN 7 07/26/2017 0535   BUN 9 06/10/2017 1214   CREATININE 0.66 07/26/2017 0535   CREATININE 0.72 06/10/2017 1214   GLUCOSE 138 (H) 07/26/2017 0535   GLUCOSE 98 06/10/2017 1214   CALCIUM 8.4 (L) 07/26/2017 0535   CALCIUM 9.0 06/10/2017 1214   AST 14 06/10/2017 1214   ALT 13 06/10/2017 1214   ALKPHOS 70 06/10/2017 1214   BILITOT 0.3 06/10/2017 1214   PROT 7.4 06/10/2017 1214   ALBUMIN 3.9 06/10/2017 1214     Studies/Results: No results found.  Anti-infectives: Anti-infectives    Start     Dose/Rate Route Frequency Ordered Stop   07/25/17 0815  gentamicin (GARAMYCIN) 320 mg, clindamycin (CLEOCIN) 900 mg in dextrose 5 % 100 mL IVPB     228 mL/hr over 30 Minutes Intravenous 60 min pre-op 07/24/17 1347 07/25/17 1045       Dmitry Macomber J 07/27/2017

## 2017-07-27 NOTE — Progress Notes (Signed)
The patient is receiving Protonix by the intravenous route.  Based on criteria approved by the Pharmacy and [place], the medication is being converted to the equivalent oral dose form.  These criteria include: -No active GI bleeding -Able to tolerate diet of full liquids (or better) or tube feeding -Able to tolerate other medications by the oral or enteral route  If you have any questions about this conversion, please contact the Pharmacy Department (phone [phone]).  Thank you. Eudelia Bunch, Pharm.D. [phone] 07/27/2017 11:31 AM

## 2017-07-27 NOTE — Progress Notes (Signed)
Foley catheter removed at this time. Pt tolerated well. Instructed to call for assistance up to bathroom, acknowledged understanding.

## 2017-07-27 NOTE — Progress Notes (Signed)
Tolerating her diet.  Ambulating in the hallway. Had several bowel movements today and the last one she passed some was "crumpets", per patient.

## 2017-07-28 MED ORDER — TRAMADOL HCL 50 MG PO TABS
50.0000 mg | ORAL_TABLET | Freq: Four times a day (QID) | ORAL | 0 refills | Status: DC | PRN
Start: 2017-07-28 — End: 2017-08-06

## 2017-07-28 MED ORDER — TRAMADOL HCL 50 MG PO TABS
50.0000 mg | ORAL_TABLET | Freq: Four times a day (QID) | ORAL | Status: DC | PRN
  Administered 2017-07-28: 50 mg via ORAL
  Filled 2017-07-28: qty 1

## 2017-07-28 MED ORDER — HYDROCODONE-ACETAMINOPHEN 5-325 MG PO TABS
1.0000 | ORAL_TABLET | ORAL | Status: DC | PRN
  Filled 2017-07-28: qty 1

## 2017-07-28 MED ORDER — HYDROCODONE-ACETAMINOPHEN 5-325 MG PO TABS: 1.0000 | ORAL_TABLET | ORAL | 0 refills | Status: DC | PRN

## 2017-07-28 NOTE — Progress Notes (Signed)
Assessment Principal Problem:   Colon cancer [facility]) s/p laparoscopic right colectomy and cholecystectomy 07/25/17-progressing well.    ABL on chronic anemia   Plan:  Advance to soft diet.  Discharge later today or tomorrow if diet tolerated.  Discharge instructions discussed with her.   LOS: 3 days     3 Days Post-Op  Chief Complaint/Subjective: Feels good.  Passing gas.  Bowels moving.  Tolerating full liquids.  Objective: Vital signs in last 24 hours: Temp:  [97.7 F (36.5 C)-98 F (36.7 C)] 97.7 F (36.5 C) (08/12 0600) Pulse Rate:  [57-62] 62 (08/12 0600) Resp:  [16-18] 18 (08/12 0600) BP: (103-125)/(62-77) 106/75 (08/12 0600) SpO2:  [98 %-100 %] 99 % (08/12 0600) Last BM Date: 07/27/17  Intake/Output from previous day: 08/11 0701 - 08/12 0700 In: 3372 [I.V.:3152; IV Piggyback:220] Out: 1750 [Urine:1750] Intake/Output this shift: No intake/output data recorded.  PE: General- In NAD.  Awake and alert. Abdomen-soft, incisions are clean, dry and intact  Lab Results:   Recent Labs  07/26/17 0535  WBC 10.0  HGB 9.8*  HCT 30.5*  PLT 384   BMET  Recent Labs  07/26/17 0535  NA 138  K 4.0  CL 106  CO2 24  GLUCOSE 138*  BUN 7  CREATININE 0.66  CALCIUM 8.4*   PT/INR No results for input(s): LABPROT, INR in the last 72 hours. Comprehensive Metabolic Panel:    Component Value Date/Time   NA 138 07/26/2017 0535   NA 137 06/10/2017 1214   K 4.0 07/26/2017 0535   K 3.8 06/10/2017 1214   CL 106 07/26/2017 0535   CL 105 06/10/2017 1214   CO2 24 07/26/2017 0535   CO2 25 06/10/2017 1214   BUN 7 07/26/2017 0535   BUN 9 06/10/2017 1214   CREATININE 0.66 07/26/2017 0535   CREATININE 0.72 06/10/2017 1214   GLUCOSE 138 (H) 07/26/2017 0535   GLUCOSE 98 06/10/2017 1214   CALCIUM 8.4 (L) 07/26/2017 0535   CALCIUM 9.0 06/10/2017 1214   AST 14 06/10/2017 1214   ALT 13 06/10/2017 1214   ALKPHOS 70 06/10/2017 1214   BILITOT 0.3 06/10/2017 1214   PROT 7.4  06/10/2017 1214   ALBUMIN 3.9 06/10/2017 1214     Studies/Results: No results found.  Anti-infectives: Anti-infectives    Start     Dose/Rate Route Frequency Ordered Stop   07/25/17 0815  gentamicin (GARAMYCIN) 320 mg, clindamycin (CLEOCIN) 900 mg in dextrose 5 % 100 mL IVPB     228 mL/hr over 30 Minutes Intravenous 60 min pre-op 07/24/17 1347 07/25/17 1045       Thaxton Pelley J 07/28/2017

## 2017-07-28 NOTE — Progress Notes (Signed)
Pt was discharged home today. Instructions were reviewed with patient, and questions were answered. Pt was taken to main entrance via wheelchair by RN.  

## 2017-08-06 ENCOUNTER — Telehealth: Payer: Self-pay | Admitting: Oncology

## 2017-08-06 ENCOUNTER — Ambulatory Visit (HOSPITAL_BASED_OUTPATIENT_CLINIC_OR_DEPARTMENT_OTHER): Payer: BLUE CROSS/BLUE SHIELD | Admitting: Oncology

## 2017-08-06 VITALS — BP 104/58 | HR 71 | Temp 98.7°F | Resp 18 | Ht 62.0 in | Wt 174.9 lb

## 2017-08-06 DIAGNOSIS — D630 Anemia in neoplastic disease: Secondary | ICD-10-CM | POA: Diagnosis not present

## 2017-08-06 DIAGNOSIS — C182 Malignant neoplasm of ascending colon: Secondary | ICD-10-CM | POA: Diagnosis not present

## 2017-08-06 NOTE — Progress Notes (Signed)
  [place] OFFICE PROGRESS NOTE   Diagnosis: Colon cancer  INTERVAL HISTORY:   Savannah Cook returns as scheduled. She underwent a laparoscopic assisted right colectomy and cholecystectomy on 07/25/2017. She reports an uneventful operative recovery. Her bowels are functioning. The pathology ([id number]) confirmed a moderately differentiated adenocarcinoma at the ileocecal valve. No macroscopic tumor perforation. Tumor invaded into pericolonic tissue. No lymphovascular invasion. Perineural invasion is present. No tumor deposits. The resection margins returned negative. 30 lymph nodes were negative for metastatic carcinoma. There was an additional tubular adenoma at the cecum. The tumor returned MSI-stable with no loss of mismatch repair protein expression.  Objective:  Vital signs in last 24 hours:  Blood pressure (!) 104/58, pulse 71, temperature 98.7 F (37.1 C), temperature source Oral, resp. rate 18, height '5\' 2"'$  (1.575 m), weight 174 lb 14.4 oz (79.3 kg), last menstrual period 07/20/2017, SpO2 100 %.     Resp: Lungs clear bilaterally with a slight decrease in breath sounds at the right base Cardio: Regular rate and rhythm GI: No hepatosplenomegaly, healed surgical incisions Vascular: No leg edema   Lab Results:  Lab Results  Component Value Date   WBC 10.0 07/26/2017   HGB 9.8 (L) 07/26/2017   HCT 30.5 (L) 07/26/2017   MCV 79.8 07/26/2017   PLT 384 07/26/2017   NEUTROABS 4.0 07/19/2017     Medications: I have reviewed the patient's current medications.  Assessment/Plan: 1. Adenocarcinoma of the ascending colon/cecum, status post a colonic biopsy 06/10/2017 ? Staging CTs 06/14/2017-ascending colon mass, small pericecal lymph nodes, 2 mm left upper lobe nodule, non-pathologically enlarged porta hepatis and retroperitoneal nodes ? Laparoscopic assisted right colectomy 07/25/2017, stage IIA (T3 N0) moderately differentiated adenocarcinoma, 30 negative lymph  nodes, positive perineural invasion, MSI stable, no loss of mismatch repair protein expression  2. Iron deficiency anemia secondary to #1  3.   Family history of colon cancer    Disposition:  Savannah Cook underwent a right colectomy on 07/25/2017. She appears to be recovering uneventfully. I discussed the details of the surgical pathology report with her. She has a good prognosis for a long-term disease-free survival. We discussed the lack of clear benefit from adjuvant chemotherapy in the majority of patients with resected stage II colon cancer. Perineural invasion is the only high-risk feature associated with her tumor.  We discussed the small potential absolute benefit from adjuvant Xeloda. She is comfortable being followed with surveillance and no adjuvant chemotherapy.  We discussed diet and exercise maneuvers that may decrease the risk of developing colon cancer. She should have a colonoscopy in one year.  Savannah Cook will return for a CBC and CEA on 08/28/2017.  She will be scheduled for an office visit and CEA in 6 months.  She does not appear to have hereditary non-polyposis colon cancer syndrome. Her family members remain at increased risk of developing colon cancer and should receive appropriate screening.  25 minutes were spent with the patient today. The majority of the time was used for counseling and coordination of care.  Donneta Romberg, MD  08/06/2017  1:29 PM

## 2017-08-06 NOTE — Telephone Encounter (Signed)
Scheduled appt per 8/21 los - patient is aware of apt date and time and reminder letter sent in the mail.

## 2017-08-07 NOTE — Progress Notes (Signed)
  Oncology Nurse Navigator Documentation  Navigator Location: [facility] (08/07/17 1003)   )Navigator Encounter Type: Telephone (08/07/17 1003)  Returned patient's phone call regarding available support services for GI cancer patients.. Patient interested in attending the GI support group on September 17th. Patient feels "blessed" and wants to "give back to others" who are going through cancer treatment. Contact information for [employer] LCSW provided. Patient will not need further tx. I encouraged her to call me with further needs or concerns.   Abnormal Finding Date: 06/10/17 (08/07/17 1003) Confirmed Diagnosis Date: 07/25/17 (08/07/17 1003) Surgery Date: 07/25/17 (08/07/17 1003)           Treatment Initiated Date: 07/25/17 (08/07/17 1003) Patient Visit Type: Initial (08/07/17 1003) Treatment Phase: Other (Post Surgery, No further tx) (08/07/17 1003) Barriers/Navigation Needs: Education (08/07/17 1003) Education: Coping with Diagnosis/ Prognosis (08/07/17 1003) Interventions: Psycho-social support (08/07/17 1003)        Support Groups/Services: GI Support Group (08/07/17 1003)   Acuity: Level 1 (08/07/17 1003)         Time Spent with Patient: 30 (08/07/17 1003)

## 2017-08-08 ENCOUNTER — Telehealth: Payer: Self-pay | Admitting: General Surgery

## 2017-08-08 NOTE — Telephone Encounter (Signed)
She called this morning and had multiple questions about her pathology.  We went over them.

## 2017-08-09 ENCOUNTER — Ambulatory Visit: Payer: BLUE CROSS/BLUE SHIELD | Admitting: Oncology

## 2017-08-09 NOTE — Discharge Summary (Signed)
Physician Discharge Summary  Patient ID: Savannah Cook MRN: [id number] DOB/AGE: 08-04-1966 51 y.o.  Admit date: 07/25/2017 Discharge date: 07/28/2017  Admission Diagnoses:  Right colon cancer; symptomatic cholelithiasis  Discharge Diagnoses: Same Principal Problem:   T3N0 right colon cancer [facility]) s/p laparoscopic right colectomy and cholecystectomy 07/25/17   Discharged Condition: good  Hospital Course: She was admitted and underwent the above procedures which she tolerated well.  She was placed on the postop colorectal surgery pathway and progressed well.  She met discharge criteria by POD # 3 and was discharged home.  Discharge instructions were given to her.   Discharge Exam: Blood pressure 106/75, pulse 62, temperature 97.7 F (36.5 C), temperature source Oral, resp. rate 18, height _0  (1.575 m), weight 80.7 kg (178 lb), last menstrual period 07/20/2017, SpO2 99 %.   Disposition: 01-Home or Self Care   Allergies as of 07/28/2017      Reactions   Other    SEASONAL   Ceclor [cefaclor] Rash      Medication List    STOP taking these medications   AMBULATORY NON FORMULARY MEDICATION   B-12 PO   Biotin 10000 MCG Tabs   ferrous sulfate 325 (65 FE) MG tablet   hydrocortisone 25 MG suppository Commonly known as:  ANUSOL-HC   OVER THE COUNTER MEDICATION   PRENATAL FORMULA PO   PROBIOTIC PO     TAKE these medications   Vitamin D-3 5000 units Tabs Take 5,000 Units by mouth daily.        Signed: Odis Hollingshead 08/09/2017, 9:45 AM

## 2017-08-13 ENCOUNTER — Telehealth: Payer: Self-pay | Admitting: Oncology

## 2017-08-13 NOTE — Telephone Encounter (Signed)
R/s appt per patietn request wanted an earlier time. Scheduled and cancelled genetics appt - patient does not think that they need the appt - patient is aware and message sent to MD and Rn

## 2017-08-22 ENCOUNTER — Telehealth: Payer: Self-pay | Admitting: *Deleted

## 2017-08-22 NOTE — Telephone Encounter (Signed)
Left message for pt to call office. She declined genetics counseling appt when schedulers contacted her, stating she'd had it done already.  Pt has not had genetics visit.

## 2017-08-26 ENCOUNTER — Telehealth: Payer: Self-pay

## 2017-08-26 NOTE — Telephone Encounter (Signed)
Called patient to confirm her lab appointment change to 9/11. Pt states that she changed her lab appointment because she did not want her labs to be altered by a prednisone injection she is getting tomorrow. Informed pt that in the future her lab work (CBC and CEA) should not be altered with the prednisone injection. Pt verbalizes understanding and appreciative of call back.

## 2017-08-26 NOTE — Telephone Encounter (Signed)
Message left via Dr. Gearldine Shown nurse requesting that I call the patient back. I left VM.

## 2017-08-27 ENCOUNTER — Other Ambulatory Visit (HOSPITAL_BASED_OUTPATIENT_CLINIC_OR_DEPARTMENT_OTHER): Payer: BLUE CROSS/BLUE SHIELD

## 2017-08-27 DIAGNOSIS — M25511 Pain in right shoulder: Secondary | ICD-10-CM | POA: Diagnosis not present

## 2017-08-27 DIAGNOSIS — D630 Anemia in neoplastic disease: Secondary | ICD-10-CM | POA: Diagnosis not present

## 2017-08-27 DIAGNOSIS — C182 Malignant neoplasm of ascending colon: Secondary | ICD-10-CM | POA: Diagnosis not present

## 2017-08-27 DIAGNOSIS — M25611 Stiffness of right shoulder, not elsewhere classified: Secondary | ICD-10-CM | POA: Diagnosis not present

## 2017-08-27 LAB — CBC WITH DIFFERENTIAL/PLATELET
BASO%: 0.5 % (ref 0.0–2.0)
BASOS ABS: 0 10*3/uL (ref 0.0–0.1)
EOS%: 3.4 % (ref 0.0–7.0)
Eosinophils Absolute: 0.2 10*3/uL (ref 0.0–0.5)
HCT: 32.5 % — ABNORMAL LOW (ref 34.8–46.6)
HGB: 10 g/dL — ABNORMAL LOW (ref 11.6–15.9)
LYMPH%: 32 % (ref 14.0–49.7)
MCH: 24.5 pg — AB (ref 25.1–34.0)
MCHC: 30.8 g/dL — AB (ref 31.5–36.0)
MCV: 79.7 fL (ref 79.5–101.0)
MONO#: 0.6 10*3/uL (ref 0.1–0.9)
MONO%: 10.5 % (ref 0.0–14.0)
NEUT#: 3 10*3/uL (ref 1.5–6.5)
NEUT%: 53.6 % (ref 38.4–76.8)
PLATELETS: 289 10*3/uL (ref 145–400)
RBC: 4.08 10*6/uL (ref 3.70–5.45)
RDW: 16.1 % — ABNORMAL HIGH (ref 11.2–14.5)
WBC: 5.6 10*3/uL (ref 3.9–10.3)
lymph#: 1.8 10*3/uL (ref 0.9–3.3)

## 2017-08-27 LAB — CEA (IN HOUSE-CHCC): CEA (CHCC-In House): 1.92 ng/mL (ref 0.00–5.00)

## 2017-08-28 ENCOUNTER — Telehealth: Payer: Self-pay | Admitting: *Deleted

## 2017-08-28 ENCOUNTER — Other Ambulatory Visit: Payer: BLUE CROSS/BLUE SHIELD

## 2017-08-28 ENCOUNTER — Encounter: Payer: BLUE CROSS/BLUE SHIELD | Admitting: Genetic Counselor

## 2017-08-28 DIAGNOSIS — D649 Anemia, unspecified: Secondary | ICD-10-CM

## 2017-08-28 NOTE — Telephone Encounter (Signed)
Telephone call to patient to advise lab results as directed below. Patient will go to her OB in November at [facility]. She would like to have her labs drawn there instead of coming in to our office.

## 2017-08-28 NOTE — Telephone Encounter (Signed)
-----   Message from Ladell Pier, MD sent at 08/27/2017  6:10 PM EDT ----- Please call patient, cea is normal, hb still low, should correct over next few months, repeat 2 months

## 2017-12-04 ENCOUNTER — Other Ambulatory Visit: Payer: Self-pay | Admitting: Women's Health

## 2017-12-04 MED ORDER — FLUCONAZOLE 100 MG PO TABS: 100.0000 mg | ORAL_TABLET | Freq: Every day | ORAL | 0 refills | Status: DC

## 2018-01-13 ENCOUNTER — Telehealth: Payer: Self-pay | Admitting: Oncology

## 2018-01-13 NOTE — Telephone Encounter (Signed)
Cancelled appt per pt request - cancelled f/u and kept lab appt.

## 2018-01-28 ENCOUNTER — Inpatient Hospital Stay: Payer: BLUE CROSS/BLUE SHIELD | Attending: Oncology

## 2018-01-28 ENCOUNTER — Other Ambulatory Visit: Payer: Self-pay

## 2018-01-28 DIAGNOSIS — C182 Malignant neoplasm of ascending colon: Secondary | ICD-10-CM | POA: Diagnosis not present

## 2018-01-28 LAB — CBC WITH DIFFERENTIAL (CANCER CENTER ONLY)
Basophils Absolute: 0 10*3/uL (ref 0.0–0.1)
Basophils Relative: 1 %
EOS ABS: 0.2 10*3/uL (ref 0.0–0.5)
EOS PCT: 3 %
HCT: 43.1 % (ref 34.8–46.6)
Hemoglobin: 14.3 g/dL (ref 11.6–15.9)
LYMPHS ABS: 2.3 10*3/uL (ref 0.9–3.3)
LYMPHS PCT: 37 %
MCH: 28.1 pg (ref 25.1–34.0)
MCHC: 33.2 g/dL (ref 31.5–36.0)
MCV: 84.7 fL (ref 79.5–101.0)
MONO ABS: 0.6 10*3/uL (ref 0.1–0.9)
MONOS PCT: 9 %
Neutro Abs: 3.2 10*3/uL (ref 1.5–6.5)
Neutrophils Relative %: 50 %
PLATELETS: 281 10*3/uL (ref 145–400)
RBC: 5.09 MIL/uL (ref 3.70–5.45)
RDW: 15.5 % — AB (ref 11.2–14.5)
WBC Count: 6.2 10*3/uL (ref 3.9–10.3)

## 2018-01-28 LAB — CEA (IN HOUSE-CHCC): CEA (CHCC-IN HOUSE): 2.18 ng/mL (ref 0.00–5.00)

## 2018-02-06 ENCOUNTER — Ambulatory Visit: Payer: BLUE CROSS/BLUE SHIELD | Admitting: Oncology

## 2018-02-06 ENCOUNTER — Other Ambulatory Visit: Payer: BLUE CROSS/BLUE SHIELD

## 2018-02-12 IMAGING — CT CT CHEST W/ CM
2 of 5 series · 14 of 46 positions shown, 16 images · IV contrast (iopamidol)
Comparison: None.

CLINICAL DATA: Ascending colon malignant tumor found a colonoscopy,
staging workup.

EXAM:
CT CHEST, ABDOMEN, AND PELVIS WITH CONTRAST
TECHNIQUE: Multidetector CT imaging of the chest, abdomen and pelvis was
performed following the standard protocol during bolus
administration of intravenous contrast.
CONTRAST:  100mL NREULW-8TT IOPAMIDOL (NREULW-8TT) INJECTION 61%

[Series 2: cap with · axial · 0.81mm/px · z∈[+736,+1266]mm · 11 of 128 slices shown, 13 images]
[im 11/128  soft-tissue]
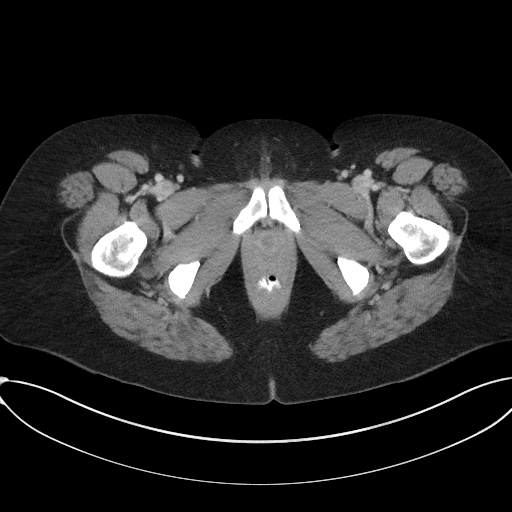
[im 11/128  bone]
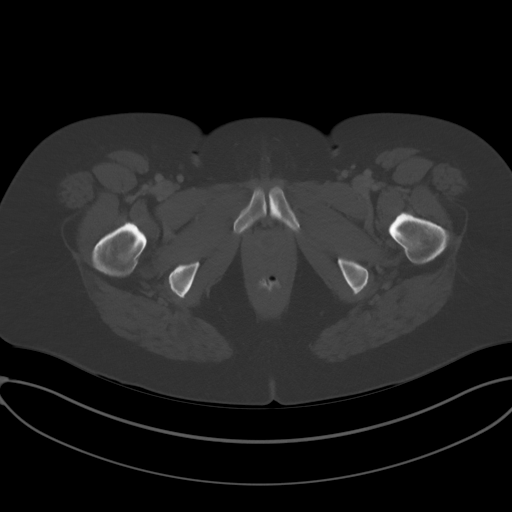
[im 22/128  soft-tissue]
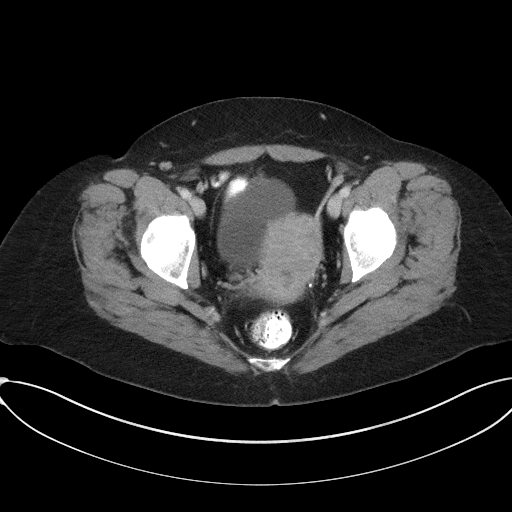
[im 32/128  soft-tissue]
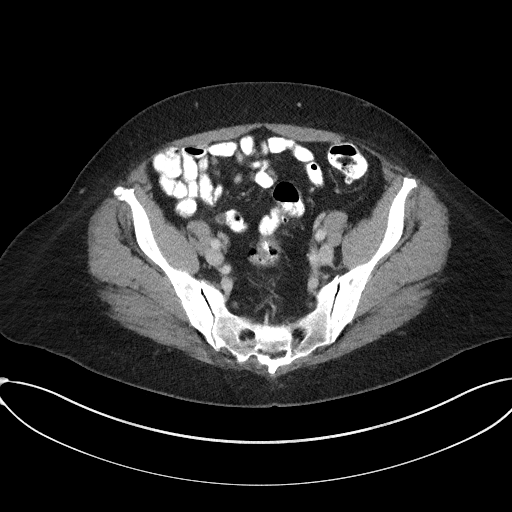
[im 43/128  soft-tissue]
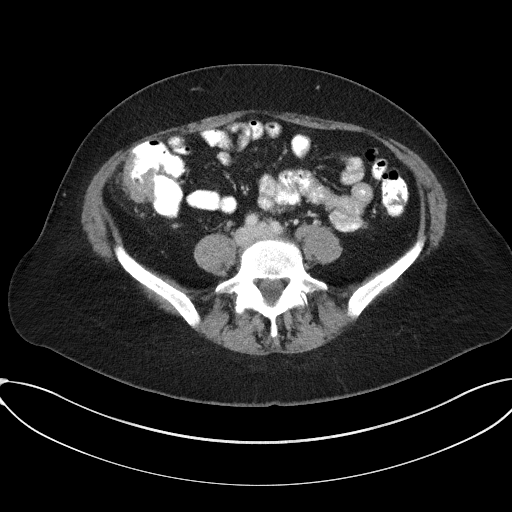
[im 53/128  soft-tissue]
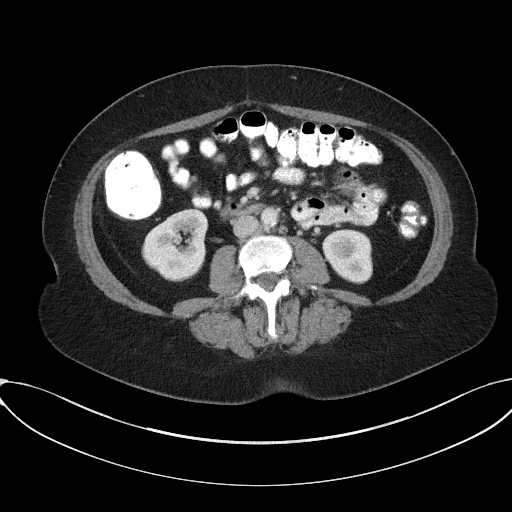
[im 64/128  soft-tissue]
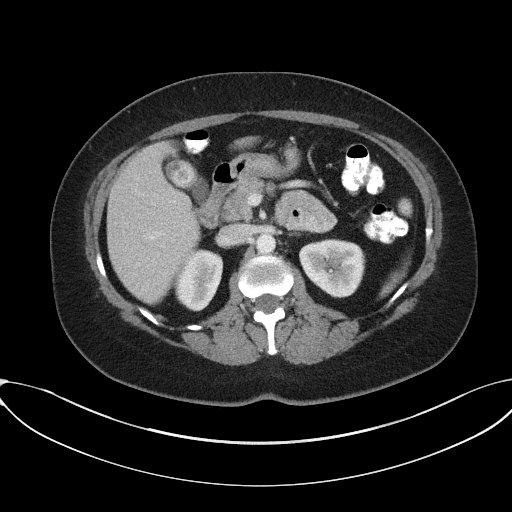
[im 75/128  soft-tissue]
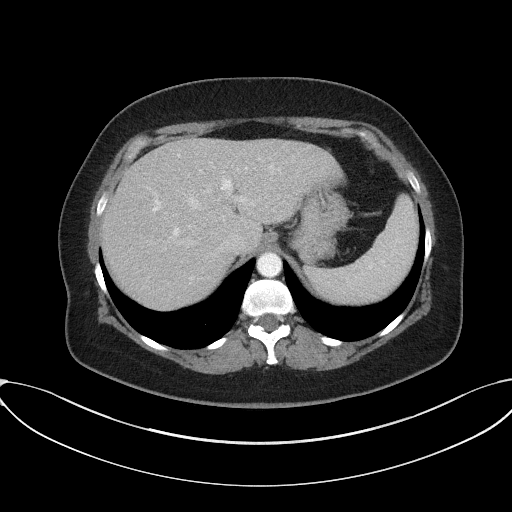
[im 85/128  soft-tissue]
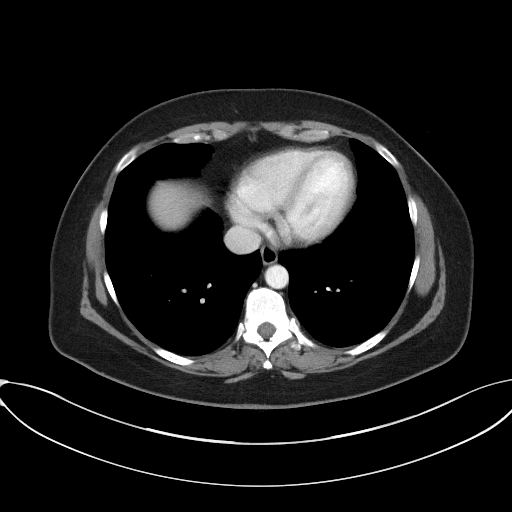
[im 96/128  soft-tissue]
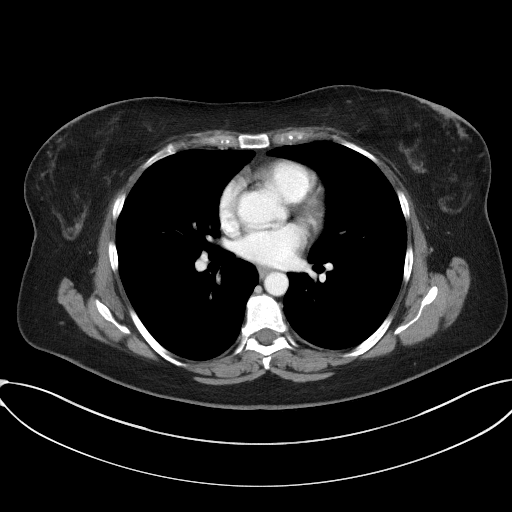
[im 96/128  bone]
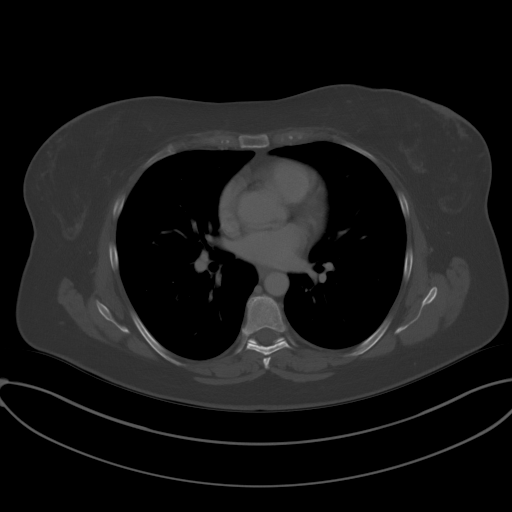
[im 106/128  soft-tissue]
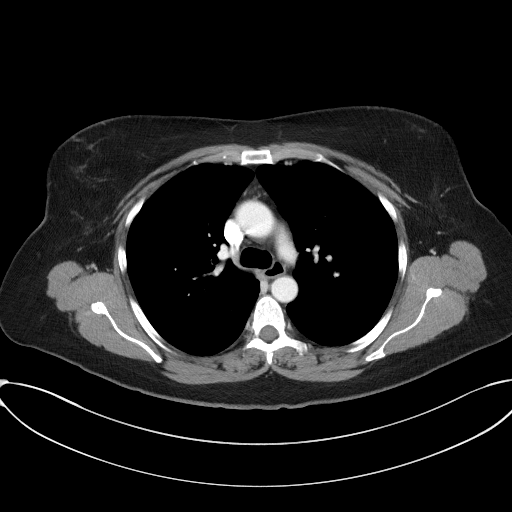
[im 117/128  soft-tissue]
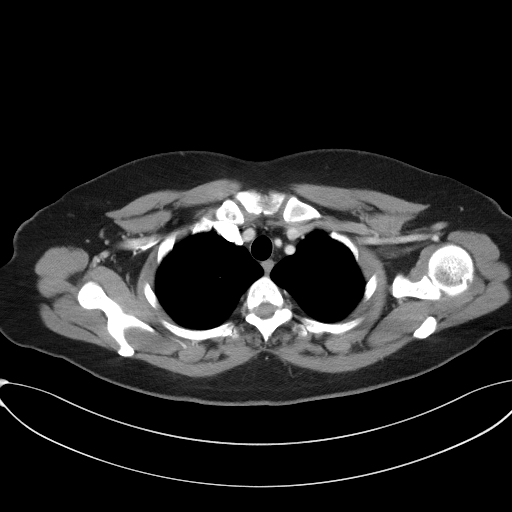

[Series 5: coronal · coronal · 0.78mm/px · 3 of 141 slices shown]
[im 47/141  soft-tissue]
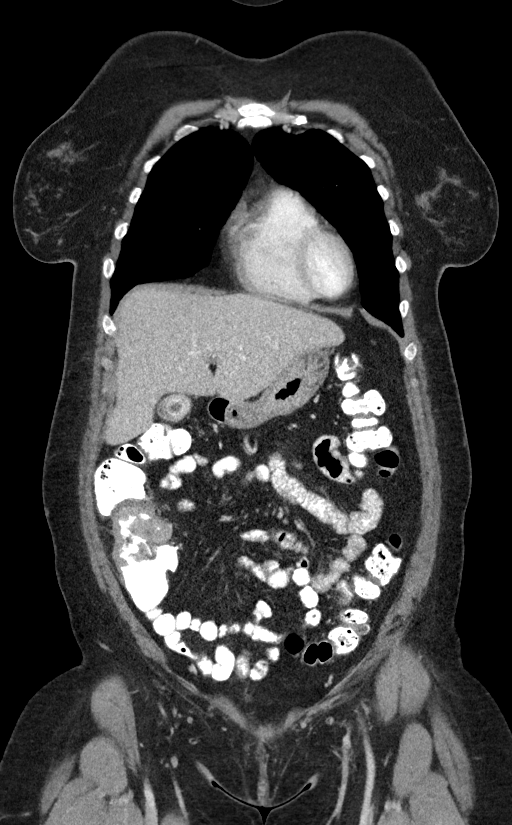
[im 63/141  soft-tissue]
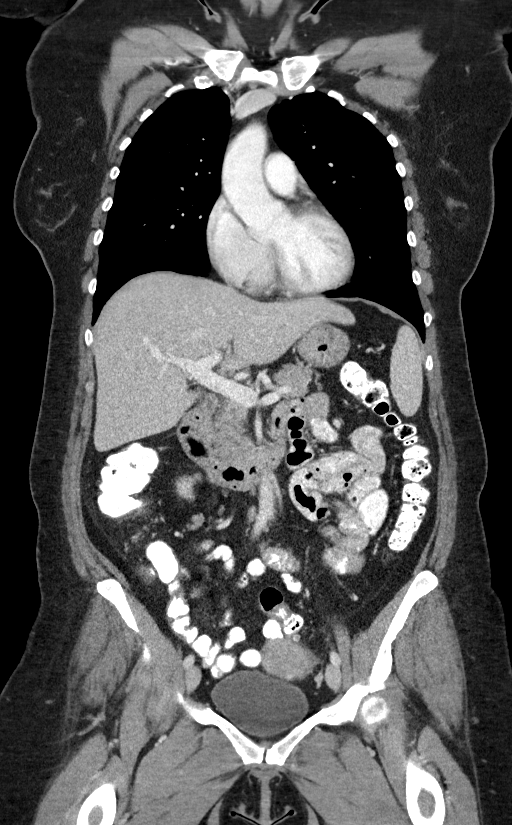
[im 78/141  soft-tissue]
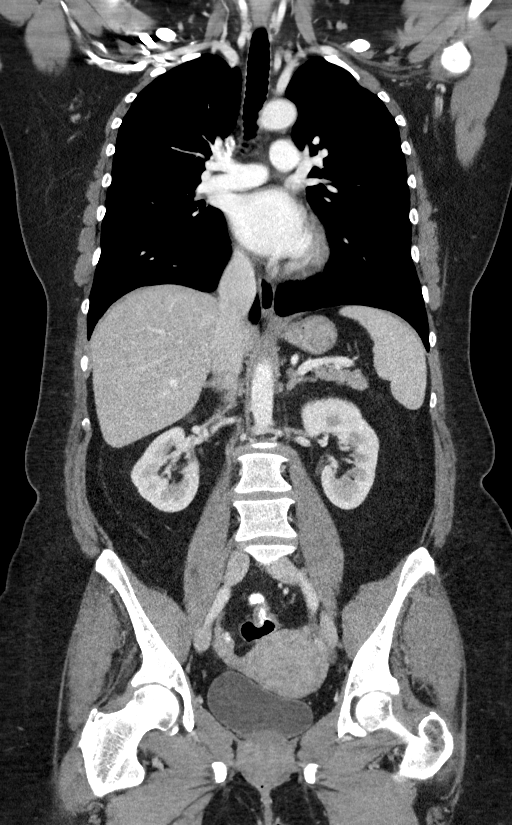

[14 of 46 positions shown; findings below may reference images not displayed]

FINDINGS: CT CHEST FINDINGS

Cardiovascular: Unremarkable

Mediastinum/Nodes: Unremarkable

Lungs/Pleura: 2 mm triangular-shaped nodule in the left upper lobe
on image 52/3. Otherwise unremarkable.

Musculoskeletal: Unremarkable

CT ABDOMEN PELVIS FINDINGS

Hepatobiliary: 1.9 cm intraluminal structure in the gallbladder with
high peripheral density which does not significantly change between
portal venous and delayed phase images, appearance favoring
gallstone. No findings of metastatic disease to the liver.

Pancreas: Unremarkable

Spleen: Unremarkable

Adrenals/Urinary Tract: Adrenal glands normal. There is at least
partial duplication and possibly complete duplication of the right
renal collecting system, without dilation or scarring.

Stomach/Bowel: Circumferential mass of the ascending colon extending
from about the level of the ileocecal valve distally, over a 4.9 cm
longitudinal course as shown on image 52/5. There is some faint
stranding in the mesentery around this lesion along with multiple
adjacent paracolic lymph nodes including a 0.8 cm in short axis node
on image 82/2, a 0.6 cm node on image 83/2, and multiple additional
small adjacent lymph nodes.

Scattered sigmoid colon diverticula.

Vascular/Lymphatic: In addition to the pericecal lymph nodes in the
vicinity of the tumor, there are small porta hepatis and
retroperitoneal lymph nodes which are not overtly pathologically
enlarged. These include a 0.7 cm node along the anterior border of
the omental foramen on image 60/2, and a left periaortic node
measuring 0.7 cm on image 70/2.

Aortoiliac atherosclerotic vascular disease.

Reproductive: Unremarkable

Other: No ascites or definite peritoneal metastatic disease

Musculoskeletal: Mild lower lumbar spondylosis.
IMPRESSION: 1. 4.9 cm in length circumferential mass of the ascending colon with
faint adjacent stranding in the mesocolon and small but notable
adjacent pericecal lymph nodes. Although the adjacent nodal
involvement cannot be totally excluded, there is no evidence of
hepatic, pulmonary, or other metastatic disease.
2. 2 mm triangular-shaped nodule in the left upper lobe, highly
likely to be benign. In the current context, surveillance may be
appropriate.
3. Single large gallstone.
4. At least partial duplication of the right renal collecting
system.
5.  Aortic Atherosclerosis ([id number]-590.0).

## 2018-02-17 ENCOUNTER — Telehealth: Payer: Self-pay | Admitting: Oncology

## 2018-02-17 ENCOUNTER — Inpatient Hospital Stay: Payer: BLUE CROSS/BLUE SHIELD | Attending: Oncology | Admitting: Oncology

## 2018-02-17 VITALS — BP 124/70 | HR 79 | Temp 98.2°F | Resp 18 | Ht 62.0 in | Wt 203.9 lb

## 2018-02-17 DIAGNOSIS — C182 Malignant neoplasm of ascending colon: Secondary | ICD-10-CM | POA: Diagnosis not present

## 2018-02-17 DIAGNOSIS — D509 Iron deficiency anemia, unspecified: Secondary | ICD-10-CM | POA: Diagnosis not present

## 2018-02-17 NOTE — Progress Notes (Signed)
  [facility] OFFICE PROGRESS NOTE   Diagnosis: Colon cancer  INTERVAL HISTORY:   Ms. Weiss returns as scheduled.  Good appetite.  She says she has been eating sugar and carbohydrates secondary to "stress ".  She plans to begin a weight loss diet.  She has decreased nail growth and dry hair.  She is taking over-the-counter vitamin supplements. No difficulty with bowel function.  Objective:  Vital signs in last 24 hours:  There were no vitals taken for this visit.    HEENT: Neck without mass Lymphatics: No cervical, supraclavicular, axillary, or inguinal nodes Resp: Lungs clear bilaterally Cardio: Regular rate and rhythm GI: No hepatosplenomegaly, no mass, nontender Vascular: No leg edema   Lab Results:    Lab Results  Component Value Date   CEA1 2.18 01/28/2018   01/28/2018: Hemoglobin 14.3, MCV 84.7   Medications: I have reviewed the patient's current medications.   Assessment/Plan: Assessment/Plan: 1. Adenocarcinoma of the ascending colon/cecum, status post a colonic biopsy 06/10/2017 ? Staging CTs 06/14/2017-ascending colon mass, small pericecal lymph nodes, 2 mm left upper lobe nodule, non-pathologically enlarged porta hepatis and retroperitoneal nodes ? Laparoscopic assisted right colectomy 07/25/2017, stage IIA (T3 N0) moderately differentiated adenocarcinoma, 30 negative lymph nodes, positive perineural invasion, MSI stable, no loss of mismatch repair protein expression  2. Iron deficiency anemia secondary to #1 resolved  3. Family history of colon cancer    Disposition: Ms. Guerry is in clinical remission from colon cancer.  She will return for an office visit and CEA in 6 months. She will schedule a surveillance colonoscopy with Dr. Arelia Longest for the summer 2019.  I recommended she follow-up with her primary physician or a dermatologist to evaluate the decreased nail growth and hair changes.  15 minutes were spent with the patient  today.  The majority of the time was used for counseling and coordination of care.  Betsy Coder, MD  02/17/2018  8:31 AM

## 2018-02-17 NOTE — Telephone Encounter (Signed)
Scheduled appt per 3/4 los - sent reminder letter in the mail - lab and f/u 9/12

## 2018-06-10 HISTORY — PX: UPPER GASTROINTESTINAL ENDOSCOPY: SHX188

## 2018-06-10 HISTORY — PX: COLONOSCOPY: SHX174

## 2018-07-15 ENCOUNTER — Encounter: Payer: Self-pay | Admitting: Women's Health

## 2018-07-15 ENCOUNTER — Ambulatory Visit: Payer: BLUE CROSS/BLUE SHIELD | Admitting: Women's Health

## 2018-07-15 ENCOUNTER — Telehealth: Payer: Self-pay

## 2018-07-15 VITALS — BP 124/90 | Ht 62.5 in | Wt 203.8 lb

## 2018-07-15 DIAGNOSIS — Z01419 Encounter for gynecological examination (general) (routine) without abnormal findings: Secondary | ICD-10-CM | POA: Diagnosis not present

## 2018-07-15 DIAGNOSIS — N898 Other specified noninflammatory disorders of vagina: Secondary | ICD-10-CM | POA: Diagnosis not present

## 2018-07-15 NOTE — Progress Notes (Signed)
Savannah Cook 03/03/1966 [id number]    History:    Presents for annual exam.  Used to cycle most months.  Normal Pap and mammogram history.  07/2017 9 and half centimeter colon cancer resection without metastasis.  Has done well since surgery but has gained some weight.  Has scheduled follow-up.  New partner in the past year.  Past medical history, past surgical history, family history and social history were all reviewed and documented in the EPIC chart.  Savannah Cook.  Savannah Cook 20 senior in college.  ROS:  A ROS was performed and pertinent positives and negatives are included.  Exam:  Vitals:   07/15/18 1546  BP: 124/90  Weight: 203 lb 12.8 oz (92.4 kg)  Height: 5' 2.5" (1.588 m)   Body mass index is 36.68 kg/m.   General appearance:  Normal Thyroid:  Symmetrical, normal in size, without palpable masses or nodularity. Respiratory  Auscultation:  Clear without wheezing or rhonchi Cardiovascular  Auscultation:  Regular rate, without rubs, murmurs or gallops  Edema/varicosities:  Not grossly evident Abdominal  Soft,nontender, without masses, guarding or rebound.  Liver/spleen:  No organomegaly noted  Hernia:  None appreciated  Skin  Inspection:  Grossly normal   Breasts: Examined lying and sitting.     Right: Without masses, retractions, discharge or axillary adenopathy.     Left: Without masses, retractions, discharge or axillary adenopathy. Gentitourinary   Inguinal/mons:  Normal without inguinal adenopathy  External genitalia:  Normal  BUS/Urethra/Skene's glands:  Normal  Vagina:  Normal  Cervix:  Normal  Uterus:   normal in size, shape and contour.  Midline and mobile  Adnexa/parametria:     Rt: Without masses or tenderness.   Lt: Without masses or tenderness.  Anus and perineum: Normal  Digital rectal exam: Normal sphincter tone without palpated masses or tenderness  Assessment/Plan:  52 y.o. S WF G1, P1 for annual exam with no complaints other  than weight gain from diet.  Perimenopausal STD screen Obesity   Plan: SBE's, due instructed to schedule, exercise, calcium rich foods, vitamin D 2000 daily encouraged.  Reviewed importance of increasing exercise and decreasing calorie/carbs.  Encouraged condoms, menopause reviewed.  Reviewed importance of keeping scheduled follow-up with Dr. Carlean Cook for repeat colonoscopy.  GC/chlamydia, denies need for HIV, hepatitis or RPR.  Pap normal 2018, new screening guidelines reviewed.   [place], 5:22 PM 07/15/2018

## 2018-07-15 NOTE — Patient Instructions (Signed)
Dr Carlean Purl [phone]   Health Maintenance for Postmenopausal Women Menopause is a normal process in which your reproductive ability comes to an end. This process happens gradually over a span of months to years, usually between the ages of 50 and 78. Menopause is complete when you have missed 12 consecutive menstrual periods. It is important to talk with your health care provider about some of the most common conditions that affect postmenopausal women, such as heart disease, cancer, and bone loss (osteoporosis). Adopting a healthy lifestyle and getting preventive care can help to promote your health and wellness. Those actions can also lower your chances of developing some of these common conditions. What should I know about menopause? During menopause, you may experience a number of symptoms, such as:  Moderate-to-severe hot flashes.  Night sweats.  Decrease in sex drive.  Mood swings.  Headaches.  Tiredness.  Irritability.  Memory problems.  Insomnia.  Choosing to treat or not to treat menopausal changes is an individual decision that you make with your health care provider. What should I know about hormone replacement therapy and supplements? Hormone therapy products are effective for treating symptoms that are associated with menopause, such as hot flashes and night sweats. Hormone replacement carries certain risks, especially as you become older. If you are thinking about using estrogen or estrogen with progestin treatments, discuss the benefits and risks with your health care provider. What should I know about heart disease and stroke? Heart disease, heart attack, and stroke become more likely as you age. This may be due, in part, to the hormonal changes that your body experiences during menopause. These can affect how your body processes dietary fats, triglycerides, and cholesterol. Heart attack and stroke are both medical emergencies. There are many things that you can do to  help prevent heart disease and stroke:  Have your blood pressure checked at least every 1-2 years. High blood pressure causes heart disease and increases the risk of stroke.  If you are 31-70 years old, ask your health care provider if you should take aspirin to prevent a heart attack or a stroke.  Do not use any tobacco products, including cigarettes, chewing tobacco, or electronic cigarettes. If you need help quitting, ask your health care provider.  It is important to eat a healthy diet and maintain a healthy weight. ? Be sure to include plenty of vegetables, fruits, low-fat dairy products, and lean protein. ? Avoid eating foods that are high in solid fats, added sugars, or salt (sodium).  Get regular exercise. This is one of the most important things that you can do for your health. ? Try to exercise for at least 150 minutes each week. The type of exercise that you do should increase your heart rate and make you sweat. This is known as moderate-intensity exercise. ? Try to do strengthening exercises at least twice each week. Do these in addition to the moderate-intensity exercise.  Know your numbers.Ask your health care provider to check your cholesterol and your blood glucose. Continue to have your blood tested as directed by your health care provider.  What should I know about cancer screening? There are several types of cancer. Take the following steps to reduce your risk and to catch any cancer development as early as possible. Breast Cancer  Practice breast self-awareness. ? This means understanding how your breasts normally appear and feel. ? It also means doing regular breast self-exams. Let your health care provider know about any changes, no matter how small.  If you are 40 or older, have a clinician do a breast exam (clinical breast exam or CBE) every year. Depending on your age, family history, and medical history, it may be recommended that you also have a yearly breast  X-ray (mammogram).  If you have a family history of breast cancer, talk with your health care provider about genetic screening.  If you are at high risk for breast cancer, talk with your health care provider about having an MRI and a mammogram every year.  Breast cancer (BRCA) gene test is recommended for women who have family members with BRCA-related cancers. Results of the assessment will determine the need for genetic counseling and BRCA1 and for BRCA2 testing. BRCA-related cancers include these types: ? Breast. This occurs in males or females. ? Ovarian. ? Tubal. This may also be called fallopian tube cancer. ? Cancer of the abdominal or pelvic lining (peritoneal cancer). ? Prostate. ? Pancreatic.  Cervical, Uterine, and Ovarian Cancer Your health care provider may recommend that you be screened regularly for cancer of the pelvic organs. These include your ovaries, uterus, and vagina. This screening involves a pelvic exam, which includes checking for microscopic changes to the surface of your cervix (Pap test).  For women ages 21-65, health care providers may recommend a pelvic exam and a Pap test every three years. For women ages 30-65, they may recommend the Pap test and pelvic exam, combined with testing for human papilloma virus (HPV), every five years. Some types of HPV increase your risk of cervical cancer. Testing for HPV may also be done on women of any age who have unclear Pap test results.  Other health care providers may not recommend any screening for nonpregnant women who are considered low risk for pelvic cancer and have no symptoms. Ask your health care provider if a screening pelvic exam is right for you.  If you have had past treatment for cervical cancer or a condition that could lead to cancer, you need Pap tests and screening for cancer for at least 20 years after your treatment. If Pap tests have been discontinued for you, your risk factors (such as having a new sexual  partner) need to be reassessed to determine if you should start having screenings again. Some women have medical problems that increase the chance of getting cervical cancer. In these cases, your health care provider may recommend that you have screening and Pap tests more often.  If you have a family history of uterine cancer or ovarian cancer, talk with your health care provider about genetic screening.  If you have vaginal bleeding after reaching menopause, tell your health care provider.  There are currently no reliable tests available to screen for ovarian cancer.  Lung Cancer Lung cancer screening is recommended for adults 55-80 years old who are at high risk for lung cancer because of a history of smoking. A yearly low-dose CT scan of the lungs is recommended if you:  Currently smoke.  Have a history of at least 30 pack-years of smoking and you currently smoke or have quit within the past 15 years. A pack-year is smoking an average of one pack of cigarettes per day for one year.  Yearly screening should:  Continue until it has been 15 years since you quit.  Stop if you develop a health problem that would prevent you from having lung cancer treatment.  Colorectal Cancer  This type of cancer can be detected and can often be prevented.  Routine colorectal cancer   screening usually begins at age 50 and continues through age 75.  If you have risk factors for colon cancer, your health care provider may recommend that you be screened at an earlier age.  If you have a family history of colorectal cancer, talk with your health care provider about genetic screening.  Your health care provider may also recommend using home test kits to check for hidden blood in your stool.  A small camera at the end of a tube can be used to examine your colon directly (sigmoidoscopy or colonoscopy). This is done to check for the earliest forms of colorectal cancer.  Direct examination of the colon  should be repeated every 5-10 years until age 75. However, if early forms of precancerous polyps or small growths are found or if you have a family history or genetic risk for colorectal cancer, you may need to be screened more often.  Skin Cancer  Check your skin from head to toe regularly.  Monitor any moles. Be sure to tell your health care provider: ? About any new moles or changes in moles, especially if there is a change in a mole's shape or color. ? If you have a mole that is larger than the size of a pencil eraser.  If any of your family members has a history of skin cancer, especially at a Arbie Blankley age, talk with your health care provider about genetic screening.  Always use sunscreen. Apply sunscreen liberally and repeatedly throughout the day.  Whenever you are outside, protect yourself by wearing long sleeves, pants, a wide-brimmed hat, and sunglasses.  What should I know about osteoporosis? Osteoporosis is a condition in which bone destruction happens more quickly than new bone creation. After menopause, you may be at an increased risk for osteoporosis. To help prevent osteoporosis or the bone fractures that can happen because of osteoporosis, the following is recommended:  If you are 19-50 years old, get at least 1,000 mg of calcium and at least 600 mg of vitamin D per day.  If you are older than age 50 but younger than age 70, get at least 1,200 mg of calcium and at least 600 mg of vitamin D per day.  If you are older than age 70, get at least 1,200 mg of calcium and at least 800 mg of vitamin D per day.  Smoking and excessive alcohol intake increase the risk of osteoporosis. Eat foods that are rich in calcium and vitamin D, and do weight-bearing exercises several times each week as directed by your health care provider. What should I know about how menopause affects my mental health? Depression may occur at any age, but it is more common as you become older. Common symptoms of  depression include:  Low or sad mood.  Changes in sleep patterns.  Changes in appetite or eating patterns.  Feeling an overall lack of motivation or enjoyment of activities that you previously enjoyed.  Frequent crying spells.  Talk with your health care provider if you think that you are experiencing depression. What should I know about immunizations? It is important that you get and maintain your immunizations. These include:  Tetanus, diphtheria, and pertussis (Tdap) booster vaccine.  Influenza every year before the flu season begins.  Pneumonia vaccine.  Shingles vaccine.  Your health care provider may also recommend other immunizations. This information is not intended to replace advice given to you by your health care provider. Make sure you discuss any questions you have with your health care   provider. Document Released: 01/25/2006 Document Revised: 06/22/2016 Document Reviewed: 09/06/2015 Elsevier Interactive Patient Education  2018 [employer].

## 2018-07-15 NOTE — Telephone Encounter (Signed)
  Savannah Cook called in and she is asking when does she need another colon and does she need an EGD with that as well. She said she see's Dr Benay Spice again in September. Her colon recall is in the system for 05/2018. Please advise Sir? She is aware you are out of the office this week.

## 2018-07-16 LAB — C. TRACHOMATIS/N. GONORRHOEAE RNA
C. TRACHOMATIS RNA, TMA: NOT DETECTED
N. gonorrhoeae RNA, TMA: NOT DETECTED

## 2018-07-16 NOTE — Telephone Encounter (Signed)
I spoke with Savannah Cook and informed her as Dr Carlean Purl advised. She wants a Monday appointment preferably around 10:30 or 11:00AM. October 14th not good. I will put her on the cancellation list and call her when the schedule for Oct. Comes out.

## 2018-07-16 NOTE — Telephone Encounter (Signed)
She needs a colonoscopy anytime but not an EGD (polyps seen there not pre-cancerous) I am slightly behind on the recalls and June not done yet - medically ok  Offer her a 730 if she wants one and there are no other openings and get her in for previsit etc

## 2018-07-22 ENCOUNTER — Encounter (INDEPENDENT_AMBULATORY_CARE_PROVIDER_SITE_OTHER): Payer: Self-pay

## 2018-07-24 ENCOUNTER — Encounter

## 2018-07-24 ENCOUNTER — Ambulatory Visit: Payer: BLUE CROSS/BLUE SHIELD | Admitting: Podiatry

## 2018-07-29 NOTE — Telephone Encounter (Signed)
I spoke with Savannah Cook and set her up pre-visit and colonoscopy appointments.

## 2018-08-01 ENCOUNTER — Encounter: Payer: Self-pay | Admitting: Women's Health

## 2018-08-01 DIAGNOSIS — Z1231 Encounter for screening mammogram for malignant neoplasm of breast: Secondary | ICD-10-CM | POA: Diagnosis not present

## 2018-08-14 ENCOUNTER — Ambulatory Visit: Payer: BLUE CROSS/BLUE SHIELD | Admitting: Podiatry

## 2018-08-20 ENCOUNTER — Telehealth: Payer: Self-pay

## 2018-08-20 ENCOUNTER — Telehealth: Payer: Self-pay | Admitting: Oncology

## 2018-08-20 NOTE — Telephone Encounter (Signed)
CALLED PATIENT REGARDING 9/4 [facility]

## 2018-08-20 NOTE — Telephone Encounter (Signed)
Spoke with pt regarding appt for labs. Pt requested to get labs drawn from outside facility. Pt informed that labs should be drawn in-house. Pt voiced understanding and requested that labs be drawn this Friday due to her being out of town next week. Also requesting to cancel MD appt and states "I will call back to reschedule after lab results". This RN voiced understanding.

## 2018-08-22 ENCOUNTER — Inpatient Hospital Stay: Payer: BLUE CROSS/BLUE SHIELD | Attending: Oncology

## 2018-08-22 DIAGNOSIS — C182 Malignant neoplasm of ascending colon: Secondary | ICD-10-CM

## 2018-08-22 DIAGNOSIS — Z85038 Personal history of other malignant neoplasm of large intestine: Secondary | ICD-10-CM | POA: Insufficient documentation

## 2018-08-22 LAB — CBC WITH DIFFERENTIAL (CANCER CENTER ONLY)
Basophils Absolute: 0 10*3/uL (ref 0.0–0.1)
Basophils Relative: 1 %
EOS ABS: 0.2 10*3/uL (ref 0.0–0.5)
EOS PCT: 3 %
HCT: 40.5 % (ref 34.8–46.6)
Hemoglobin: 13.7 g/dL (ref 11.6–15.9)
Lymphocytes Relative: 33 %
Lymphs Abs: 1.9 10*3/uL (ref 0.9–3.3)
MCH: 30.2 pg (ref 25.1–34.0)
MCHC: 33.8 g/dL (ref 31.5–36.0)
MCV: 89.4 fL (ref 79.5–101.0)
MONO ABS: 0.5 10*3/uL (ref 0.1–0.9)
MONOS PCT: 9 %
NEUTROS PCT: 54 %
Neutro Abs: 3.1 10*3/uL (ref 1.5–6.5)
PLATELETS: 282 10*3/uL (ref 145–400)
RBC: 4.53 MIL/uL (ref 3.70–5.45)
RDW: 13.2 % (ref 11.2–14.5)
WBC Count: 5.8 10*3/uL (ref 3.9–10.3)

## 2018-08-22 LAB — CEA (IN HOUSE-CHCC): CEA (CHCC-IN HOUSE): 2.47 ng/mL (ref 0.00–5.00)

## 2018-08-28 ENCOUNTER — Other Ambulatory Visit: Payer: BLUE CROSS/BLUE SHIELD

## 2018-08-28 ENCOUNTER — Ambulatory Visit: Payer: BLUE CROSS/BLUE SHIELD | Admitting: Oncology

## 2018-09-01 DIAGNOSIS — D225 Melanocytic nevi of trunk: Secondary | ICD-10-CM | POA: Diagnosis not present

## 2018-09-01 DIAGNOSIS — L814 Other melanin hyperpigmentation: Secondary | ICD-10-CM | POA: Diagnosis not present

## 2018-09-01 DIAGNOSIS — D1801 Hemangioma of skin and subcutaneous tissue: Secondary | ICD-10-CM | POA: Diagnosis not present

## 2018-09-01 DIAGNOSIS — L821 Other seborrheic keratosis: Secondary | ICD-10-CM | POA: Diagnosis not present

## 2018-09-11 ENCOUNTER — Ambulatory Visit (INDEPENDENT_AMBULATORY_CARE_PROVIDER_SITE_OTHER): Payer: BLUE CROSS/BLUE SHIELD

## 2018-09-11 ENCOUNTER — Ambulatory Visit (INDEPENDENT_AMBULATORY_CARE_PROVIDER_SITE_OTHER): Payer: BLUE CROSS/BLUE SHIELD | Admitting: Podiatry

## 2018-09-11 ENCOUNTER — Encounter: Payer: Self-pay | Admitting: Podiatry

## 2018-09-11 VITALS — BP 110/73 | HR 88 | Resp 16

## 2018-09-11 DIAGNOSIS — M722 Plantar fascial fibromatosis: Secondary | ICD-10-CM

## 2018-09-11 MED ORDER — TRIAMCINOLONE ACETONIDE 10 MG/ML IJ SUSP
10.0000 mg | Freq: Once | INTRAMUSCULAR | Status: AC
  Administered 2018-09-11: 10 mg

## 2018-09-11 MED ORDER — DICLOFENAC SODIUM 75 MG PO TBEC: 75.0000 mg | DELAYED_RELEASE_TABLET | Freq: Two times a day (BID) | ORAL | 2 refills | Status: DC

## 2018-09-11 NOTE — Progress Notes (Signed)
Subjective:   Patient ID: Savannah Cook, female   DOB: 52 y.o.   MRN: [id number]   HPI Patient presents with significant pain plantar aspect left heel of approximate 7 months duration and had increased activity prior to that when the pain started.  Patient does not smoke and likes to be active   Review of Systems  All other systems reviewed and are negative.       Objective:  Physical Exam  Constitutional: She appears well-developed and well-nourished.  Cardiovascular: Intact distal pulses.  Pulmonary/Chest: Effort normal.  Musculoskeletal: Normal range of motion.  Neurological: She is alert.  Skin: Skin is warm.  Nursing note and vitals reviewed.   Neurovascular status found to be intact muscle strength is adequate range of motion within normal limits with exquisite discomfort plantar aspect left heel the insertional point of the tendon into the calcaneus with fluid buildup and fluid accumulation.  Patient has good digital perfusion well oriented x3     Assessment:  Acute plantar fasciitis left with inflammation fluid buildup     Plan:  H&P x-rays reviewed and today I injected the plantar fascial left 3 mg Kenalog 5 mg Xylocaine and applied fascial brace.  Gave instructions for physical therapy and placed on diclofenac 75 mg twice daily and gave instructions for shoe gear modification  X-ray indicates large plantar spur left with moderate depression of the arch

## 2018-09-11 NOTE — Patient Instructions (Signed)

## 2018-09-11 NOTE — Progress Notes (Signed)
   Subjective:    Patient ID: Savannah Cook, female    DOB: 1966-08-30, 52 y.o.   MRN: [id number]  HPI    Review of Systems  All other systems reviewed and are negative.      Objective:   Physical Exam        Assessment & Plan:

## 2018-09-15 ENCOUNTER — Encounter

## 2018-09-18 ENCOUNTER — Ambulatory Visit (AMBULATORY_SURGERY_CENTER): Payer: Self-pay | Admitting: *Deleted

## 2018-09-18 VITALS — Ht 62.5 in | Wt 200.0 lb

## 2018-09-18 DIAGNOSIS — Z85038 Personal history of other malignant neoplasm of large intestine: Secondary | ICD-10-CM

## 2018-09-18 NOTE — Progress Notes (Signed)
Patient denies any allergies to eggs or soy. Patient denies any problems with anesthesia/sedation. Patient denies any oxygen use at home. Patient denies taking any diet/weight loss medications or blood thinners. EMMI education offered, pt declined.  

## 2018-09-19 ENCOUNTER — Encounter: Payer: Self-pay | Admitting: Internal Medicine

## 2018-09-26 ENCOUNTER — Ambulatory Visit: Payer: BLUE CROSS/BLUE SHIELD | Admitting: Podiatry

## 2018-09-26 ENCOUNTER — Encounter: Payer: Self-pay | Admitting: Podiatry

## 2018-09-26 DIAGNOSIS — M722 Plantar fascial fibromatosis: Secondary | ICD-10-CM | POA: Diagnosis not present

## 2018-09-26 MED ORDER — TRIAMCINOLONE ACETONIDE 10 MG/ML IJ SUSP
10.0000 mg | Freq: Once | INTRAMUSCULAR | Status: AC
  Administered 2018-09-26: 10 mg

## 2018-09-26 NOTE — Progress Notes (Signed)
Subjective:   Patient ID: Savannah Cook, female   DOB: 52 y.o.   MRN: [id number]   HPI Patient states she is still having quite a bit of pain but has had some improvement from previous visit   ROS      Objective:  Physical Exam  Neurovascular status intact with continued discomfort plantar aspect left foot at the insertion of the tendon into the calcaneus     Assessment:  Plantar fasciitis left improved but still present     Plan:  H&P condition reviewed and reinjected the plantar fascial 3 Milgram Kenalog 5 Milgram Xylocaine dispensed over-the-counter insoles discussed custom orthotics as her coverage for this.  Will be seen back depending on symptoms

## 2018-09-29 ENCOUNTER — Encounter: Payer: Self-pay | Admitting: Internal Medicine

## 2018-09-29 ENCOUNTER — Ambulatory Visit (AMBULATORY_SURGERY_CENTER): Payer: BLUE CROSS/BLUE SHIELD | Admitting: Internal Medicine

## 2018-09-29 ENCOUNTER — Encounter: Payer: BLUE CROSS/BLUE SHIELD | Admitting: Internal Medicine

## 2018-09-29 VITALS — BP 109/73 | HR 66 | Temp 99.1°F | Resp 14 | Ht 62.5 in | Wt 200.0 lb

## 2018-09-29 DIAGNOSIS — Z86010 Personal history of colonic polyps: Secondary | ICD-10-CM | POA: Diagnosis not present

## 2018-09-29 DIAGNOSIS — K635 Polyp of colon: Secondary | ICD-10-CM | POA: Diagnosis not present

## 2018-09-29 DIAGNOSIS — D123 Benign neoplasm of transverse colon: Secondary | ICD-10-CM

## 2018-09-29 DIAGNOSIS — Z85038 Personal history of other malignant neoplasm of large intestine: Secondary | ICD-10-CM | POA: Diagnosis not present

## 2018-09-29 MED ORDER — SODIUM CHLORIDE 0.9 % IV SOLN: 500.0000 mL | Freq: Once | INTRAVENOUS | Status: DC

## 2018-09-29 NOTE — Progress Notes (Signed)
Called to room to assist during endoscopic procedure.  Patient ID and intended procedure confirmed with present staff. Received instructions for my participation in the procedure from the performing physician.  

## 2018-09-29 NOTE — Progress Notes (Signed)
Great news!  Thank you for taking such good care of her.  Elon Alas

## 2018-09-29 NOTE — Op Note (Signed)
[facility] Patient Name: Savannah Cook Procedure Date: 09/29/2018 10:49 AM MRN: [id number] Endoscopist: Gatha Mayer , MD Age: 52 Referring MD:  Date of Birth: [birth year] Gender: Female Account #: [id number] Procedure:                Colonoscopy Indications:              High risk colon cancer surveillance: Personal                            history of colon cancer resected 2018 Medicines:                Propofol per Anesthesia, Monitored Anesthesia Care Procedure:                Pre-Anesthesia Assessment:                           - Prior to the procedure, a History and Physical                            was performed, and patient medications and                            allergies were reviewed. The patient's tolerance of                            previous anesthesia was also reviewed. The risks                            and benefits of the procedure and the sedation                            options and risks were discussed with the patient.                            All questions were answered, and informed consent                            was obtained. Prior Anticoagulants: The patient has                            taken no previous anticoagulant or antiplatelet                            agents. ASA Grade Assessment: II - A patient with                            mild systemic disease. After reviewing the risks                            and benefits, the patient was deemed in                            satisfactory condition to undergo the procedure.  After obtaining informed consent, the colonoscope                            was passed under direct vision. Throughout the                            procedure, the patient's blood pressure, pulse, and                            oxygen saturations were monitored continuously. The                            Model CF-HQ190L [id number]) scope was introduced   through the anus and advanced to the the                            ileocolonic anastomosis. The colonoscopy was                            performed without difficulty. The patient tolerated                            the procedure well. The quality of the bowel                            preparation was excellent. The bowel preparation                            used was Miralax. The rectum and Ileocolonic                            anastomsis areas were photographed. Scope In: 10:55:09 AM Scope Out: 11:04:15 AM Scope Withdrawal Time: 0 hours 7 minutes 42 seconds  Total Procedure Duration: 0 hours 9 minutes 6 seconds  Findings:                 The perianal and digital rectal examinations were                            normal.                           A diminutive polyp was found in the transverse                            colon. The polyp was sessile. The polyp was removed                            with a cold snare. Resection and retrieval were                            complete. Verification of patient identification                            for the specimen was done. Estimated blood loss  was                            minimal.                           Scattered diverticula were found in the entire                            colon.                           There was evidence of a prior end-to-side                            ileo-colonic anastomosis in the transverse colon.                            This was patent and was characterized by healthy                            appearing mucosa. The anastomosis was traversed.                           The exam was otherwise without abnormality on                            direct and retroflexion views. Complications:            No immediate complications. Estimated Blood Loss:     Estimated blood loss was minimal. Impression:               - One diminutive polyp in the transverse colon,                            removed with  a cold snare. Resected and retrieved.                           - Diverticulosis in the entire examined colon.                           - Patent end-to-side ileo-colonic anastomosis,                            characterized by healthy appearing mucosa.                           - The examination was otherwise normal on direct                            and retroflexion views.                           - Personal history of malignant neoplasm of the                            colon. resected 2018 Recommendation:           -  Patient has a contact number available for                            emergencies. The signs and symptoms of potential                            delayed complications were discussed with the                            patient. Return to normal activities tomorrow.                            Written discharge instructions were provided to the                            patient.                           - Resume previous diet.                           - Continue present medications.                           - Repeat colonoscopy is recommended for                            surveillance. The colonoscopy date will be                            determined after pathology results from today's                            exam become available for review. Gatha Mayer, MD 09/29/2018 11:11:22 AM This report has been signed electronically.

## 2018-09-29 NOTE — Progress Notes (Signed)
Report to PACU, RN, vss, BBS= Clear.  

## 2018-09-29 NOTE — Patient Instructions (Addendum)
I found and removed one tiny polyp. NOT cancer. Will prove it.  I will let you know pathology results and when to have another routine colonoscopy by mail and/or My Chart. Suspect it will be 3 years.  I appreciate the opportunity to care for you. Gatha Mayer, MD, FACG YOU HAD AN ENDOSCOPIC PROCEDURE TODAY AT [facility]:   Refer to the procedure report that was given to you for any specific questions about what was found during the examination.  If the procedure report does not answer your questions, please call your gastroenterologist to clarify.  If you requested that your care partner not be given the details of your procedure findings, then the procedure report has been included in a sealed envelope for you to review at your convenience later.  YOU SHOULD EXPECT: Some feelings of bloating in the abdomen. Passage of more gas than usual.  Walking can help get rid of the air that was put into your GI tract during the procedure and reduce the bloating. If you had a lower endoscopy (such as a colonoscopy or flexible sigmoidoscopy) you may notice spotting of blood in your stool or on the toilet paper. If you underwent a bowel prep for your procedure, you may not have a normal bowel movement for a few days.  Please Note:  You might notice some irritation and congestion in your nose or some drainage.  This is from the oxygen used during your procedure.  There is no need for concern and it should clear up in a day or so.  SYMPTOMS TO REPORT IMMEDIATELY:   Following lower endoscopy (colonoscopy or flexible sigmoidoscopy):  Excessive amounts of blood in the stool  Significant tenderness or worsening of abdominal pains  Swelling of the abdomen that is new, acute  Fever of 100F or higher  For urgent or emergent issues, a gastroenterologist can be reached at any hour by calling [phone].   DIET:  We do recommend a small meal at first, but then you may proceed to  your regular diet.  Drink plenty of fluids but you should avoid alcoholic beverages for 24 hours.  ACTIVITY:  You should plan to take it easy for the rest of today and you should NOT DRIVE or use heavy machinery until tomorrow (because of the sedation medicines used during the test).    FOLLOW UP: Our staff will call the number listed on your records the next business day following your procedure to check on you and address any questions or concerns that you may have regarding the information given to you following your procedure. If we do not reach you, we will leave a message.  However, if you are feeling well and you are not experiencing any problems, there is no need to return our call.  We will assume that you have returned to your regular daily activities without incident.  If any biopsies were taken you will be contacted by phone or by letter within the next 1-3 weeks.  Please call us at [phone] if you have not heard about the biopsies in 3 weeks.   Await for biopsy results Polyps (handout given) Diverticulosis (handout given)  SIGNATURES/CONFIDENTIALITY: You and/or your care partner have signed paperwork which will be entered into your electronic medical record.  These signatures attest to the fact that that the information above on your After Visit Summary has been reviewed and is understood.  Full responsibility of the confidentiality of this discharge information  lies with you and/or your care-partner.

## 2018-09-29 NOTE — Progress Notes (Signed)
No problems noted in the recovery room. maw 

## 2018-09-30 ENCOUNTER — Telehealth: Payer: Self-pay

## 2018-09-30 NOTE — Telephone Encounter (Signed)
  Follow up Call-  Call back number 09/29/2018 06/10/2017  Post procedure Call Back phone  # [phone]  Permission to leave phone message No Yes  Some recent data might be hidden     Patient questions:  Do you have a fever, pain , or abdominal swelling? No. Pain Score  0 *  Have you tolerated food without any problems? Yes.    Have you been able to return to your normal activities? Yes.    Do you have any questions about your discharge instructions: Diet   No. Medications  No. Follow up visit  No.  Do you have questions or concerns about your Care? No.  Actions: * If pain score is 4 or above: No action needed, pain <4.

## 2018-10-01 ENCOUNTER — Telehealth: Payer: Self-pay | Admitting: Podiatry

## 2018-10-01 NOTE — Telephone Encounter (Signed)
Called pt with benefit information for the orthotics and they are covered @ 80% after 350.00 deductible.(met 346.78) I told her estimate for her portion would be about 80.00.   Pt stated thank you for checking but she is not interested in getting orthotics at this time.

## 2018-10-06 ENCOUNTER — Ambulatory Visit: Payer: BLUE CROSS/BLUE SHIELD | Admitting: Podiatry

## 2018-10-06 ENCOUNTER — Encounter: Payer: Self-pay | Admitting: Podiatry

## 2018-10-06 DIAGNOSIS — L03031 Cellulitis of right toe: Secondary | ICD-10-CM

## 2018-10-06 DIAGNOSIS — M722 Plantar fascial fibromatosis: Secondary | ICD-10-CM | POA: Diagnosis not present

## 2018-10-06 MED ORDER — NEOMYCIN-POLYMYXIN-HC 3.5-10000-1 OT SOLN: OTIC | 0 refills | Status: DC

## 2018-10-06 NOTE — Patient Instructions (Signed)

## 2018-10-08 ENCOUNTER — Encounter: Payer: Self-pay | Admitting: Internal Medicine

## 2018-10-08 DIAGNOSIS — Z860100 Personal history of colon polyps, unspecified: Secondary | ICD-10-CM

## 2018-10-08 HISTORY — DX: Personal history of colonic polyps: Z86.010

## 2018-10-08 HISTORY — DX: Personal history of colon polyps, unspecified: Z86.0100

## 2018-10-08 NOTE — Progress Notes (Signed)
Diminutive ssp Recall 2022 My Chart

## 2018-10-09 ENCOUNTER — Encounter (HOSPITAL_BASED_OUTPATIENT_CLINIC_OR_DEPARTMENT_OTHER): Payer: Self-pay | Admitting: Emergency Medicine

## 2018-10-09 ENCOUNTER — Other Ambulatory Visit: Payer: Self-pay

## 2018-10-09 ENCOUNTER — Emergency Department (HOSPITAL_BASED_OUTPATIENT_CLINIC_OR_DEPARTMENT_OTHER)
Admission: EM | Admit: 2018-10-09 | Discharge: 2018-10-09 | Disposition: A | Discharge status: Home / Self Care | Payer: BLUE CROSS/BLUE SHIELD | Attending: Emergency Medicine | Admitting: Emergency Medicine

## 2018-10-09 DIAGNOSIS — Z87891 Personal history of nicotine dependence: Secondary | ICD-10-CM | POA: Diagnosis not present

## 2018-10-09 DIAGNOSIS — Z79899 Other long term (current) drug therapy: Secondary | ICD-10-CM | POA: Insufficient documentation

## 2018-10-09 DIAGNOSIS — L509 Urticaria, unspecified: Secondary | ICD-10-CM | POA: Insufficient documentation

## 2018-10-09 DIAGNOSIS — R21 Rash and other nonspecific skin eruption: Secondary | ICD-10-CM | POA: Diagnosis not present

## 2018-10-09 MED ORDER — FAMOTIDINE 20 MG PO TABS
20.0000 mg | ORAL_TABLET | Freq: Once | ORAL | Status: AC
  Administered 2018-10-09: 20 mg via ORAL
  Filled 2018-10-09: qty 1

## 2018-10-09 MED ORDER — DIPHENHYDRAMINE HCL 25 MG PO CAPS
50.0000 mg | ORAL_CAPSULE | Freq: Once | ORAL | Status: AC
  Administered 2018-10-09: 50 mg via ORAL
  Filled 2018-10-09: qty 2

## 2018-10-09 MED ORDER — DEXAMETHASONE SODIUM PHOSPHATE 10 MG/ML IJ SOLN
10.0000 mg | Freq: Once | INTRAMUSCULAR | Status: AC
  Administered 2018-10-09: 10 mg via INTRAMUSCULAR
  Filled 2018-10-09: qty 1

## 2018-10-09 NOTE — Discharge Instructions (Addendum)
Take Benadryl 50 mg (2 tablets) every 6 hours as needed for hives and itching.  You may also take Pepcid AC 20 mg twice daily as well.

## 2018-10-09 NOTE — ED Triage Notes (Signed)
Pt states Monday she went to the podiatrist and had a procedure done to on her toe  She states he numbed it  Was not put on any antibiotics Monday night she noticed a couple spots on her abdomen  Tuesday she had more spots so she took some benadryl  Tonight she woke up with red rash on her hands and arms and has red welts noted on her torso

## 2018-10-09 NOTE — ED Provider Notes (Signed)
[facility] [facility] Provider Note: Georgena Spurling, MD, FACEP  CSN: [id number] MRN: [id number] ARRIVAL: 10/09/18 at [place]: [place]  10/09/18 1:37 AM Savannah Cook is a 52 y.o. female who recently had an I&D of a paronychia on her right great toe.  She was not given antibiotics after the procedure.  She is here with a 2-day history of generalized hives more prominent on her trunk and upper extremities.  They are moderately itchy.  She has taken 125 mg dose of Benadryl 2 days ago but otherwise has not been taking anything for it.  She denies throat swelling, shortness of breath, nausea, vomiting or diarrhea.  She is not aware of what may have triggered this.   Past Medical History:  Diagnosis Date  . Allergy   . Anxiety   . Colon cancer ([place]) 06/10/2017  . GERD (gastroesophageal reflux disease)   . Hx of colonic polyp - sessile serrated 10/08/2018  . Iron deficiency anemia due to chronic blood loss    colon cancer  . Seasonal allergies     Past Surgical History:  Procedure Laterality Date  . CHOLECYSTECTOMY N/A 07/25/2017   Procedure: CHOLECYSTECTOMY;  Surgeon: Jackolyn Confer, MD;  Location: [facility];  Service: General;  Laterality: N/A;  . COLONOSCOPY  06/10/2018   w/Dr.Gessner   . LAPAROSCOPIC PARTIAL COLECTOMY N/A 07/25/2017   Procedure: LAPAROSCOPIC PARTIAL COLECTOMY;  Surgeon: Jackolyn Confer, MD;  Location: [facility];  Service: General;  Laterality: N/A;  . UPPER GASTROINTESTINAL ENDOSCOPY  06/10/2018  . WISDOM TOOTH EXTRACTION    . WRIST SURGERY Right 04/2012    Family History  Problem Relation Age of Onset  . Diabetes Mother   . Hypertension Mother   . Bipolar disorder Mother   . COPD Mother   . Diabetes Father   . Hypertension Father   . Lung cancer Father   . Colon cancer Maternal Grandfather 95  . Heart disease Maternal Grandmother   . Colon polyps Neg Hx   . Esophageal cancer Neg Hx   . Rectal  cancer Neg Hx   . Stomach cancer Neg Hx     Social History   Tobacco Use  . Smoking status: Former Smoker    Last attempt to quit: 12/17/1985    Years since quitting: 32.8  . Smokeless tobacco: Never Used  Substance Use Topics  . Alcohol use: Yes    Alcohol/week: 2.0 standard drinks    Types: 2 Standard drinks or equivalent per week    Comment: social-4 drinks a month per pt  . Drug use: No    Prior to Admission medications   Medication Sig Start Date End Date Taking? Authorizing Provider  Cholecalciferol (VITAMIN D-3) 5000 units TABS Take 5,000 Units by mouth daily.    [provider]  diclofenac (VOLTAREN) 75 MG EC tablet Take 1 tablet (75 mg total) by mouth 2 (two) times daily. 09/11/18   Wallene Huh, DPM  fluconazole (DIFLUCAN) 100 MG tablet Take 1 tablet (100 mg total) by mouth daily. 12/04/17   Huel Cote, NP  FOLIC ACID PO Take by mouth.    [provider]  Multiple Vitamin (MULTIVITAMIN) capsule Take 1 capsule by mouth daily.    [provider]  neomycin-polymyxin-hydrocortisone (CORTISPORIN) OTIC solution Apply 1-2 drops to toe after soaking twice a day 10/06/18   Wallene Huh, DPM  OVER THE COUNTER MEDICATION [place] C for  spleen and liver function    [provider]    Allergies Other and Ceclor [cefaclor]   REVIEW OF SYSTEMS  Negative except as noted here or in the History of Present Illness.   PHYSICAL EXAMINATION  Initial Vital Signs Blood pressure (!) 146/86, pulse 77, temperature 98 F (36.7 C), temperature source Oral, resp. rate 18, height 5\' 2"  (1.575 m), weight 88.5 kg, last menstrual period 09/24/2018, SpO2 97 %.  Examination General: Well-developed, well-nourished female in no acute distress; appearance consistent with age of record HENT: normocephalic; atraumatic; no dysphonia; no stridor Eyes: pupils equal, round and reactive to light; extraocular muscles intact Neck: supple Heart: regular rate and  rhythm Lungs: clear to auscultation bilaterally Abdomen: soft; nondistended; nontender; bowel sounds present Extremities: No deformity; full range of motion; pulses normal Neurologic: Awake, alert and oriented; motor function intact in all extremities and symmetric; no facial droop Skin: Warm and dry; generalized urticarial rash with relative sparing of the lower extremities Psychiatric: Normal mood and affect   RESULTS  Summary of this visit's results, reviewed by myself:   EKG Interpretation  Date/Time:    Ventricular Rate:    PR Interval:    QRS Duration:   QT Interval:    QTC Calculation:   R Axis:     Text Interpretation:        Laboratory Studies: No results found for this or any previous visit (from the past 24 hour(s)). Imaging Studies: No results found.  ED COURSE and MDM  Nursing notes and initial vitals signs, including pulse oximetry, reviewed.  Vitals:   10/09/18 0129  BP: (!) 146/86  Pulse: 77  Resp: 18  Temp: 98 F (36.7 C)  TempSrc: Oral  SpO2: 97%  Weight: 88.5 kg  Height: 5\' 2"  (1.575 m)   Will have patient take Benadryl and Pepcid on a regular basis and administer dexamethasone in the ED.  She has a dermatologist with whom she can follow-up.  PROCEDURES    ED DIAGNOSES     ICD-10-CM   1. Hives L50.9        Betul Brisky, MD 10/09/18 [phone]

## 2018-10-09 NOTE — Progress Notes (Signed)
Subjective:   Patient ID: Savannah Cook, female   DOB: 52 y.o.   MRN: [id number]   HPI Patient presents with painful nail bed right hallux after having a pedicure approximately a week ago.  States is been sore and making it hard to wear shoe gear   ROS      Objective:  Physical Exam  Neurovascular status found to be intact muscle strength is adequate patient's right hallux lateral border found to be inflamed with localized redness with no proximal edema erythema or drainage noted with pain upon palpation    Assessment:  Paronychia infection right hallux lateral border with incurvation of the bed     Plan:  H&P condition reviewed and today I infiltrated the right hallux 60 mg like Marcaine mixture remove the border moved all proud flesh abscess tissue and allow channel for drainage.  Instructed on soaks and patient will be seen back if any symptoms abnormal were to occur or any proximal signs of erythema edema or continued pain

## 2019-07-17 ENCOUNTER — Other Ambulatory Visit: Payer: Self-pay

## 2019-07-20 ENCOUNTER — Ambulatory Visit: Payer: BC Managed Care – PPO | Admitting: Women's Health

## 2019-07-20 ENCOUNTER — Encounter: Payer: Self-pay | Admitting: Women's Health

## 2019-07-20 ENCOUNTER — Other Ambulatory Visit: Payer: Self-pay

## 2019-07-20 VITALS — BP 110/80 | Ht 62.0 in | Wt 202.0 lb

## 2019-07-20 DIAGNOSIS — L603 Nail dystrophy: Secondary | ICD-10-CM | POA: Diagnosis not present

## 2019-07-20 DIAGNOSIS — R635 Abnormal weight gain: Secondary | ICD-10-CM | POA: Diagnosis not present

## 2019-07-20 DIAGNOSIS — Z862 Personal history of diseases of the blood and blood-forming organs and certain disorders involving the immune mechanism: Secondary | ICD-10-CM

## 2019-07-20 DIAGNOSIS — Z01419 Encounter for gynecological examination (general) (routine) without abnormal findings: Secondary | ICD-10-CM

## 2019-07-20 DIAGNOSIS — R87618 Other abnormal cytological findings on specimens from cervix uteri: Secondary | ICD-10-CM | POA: Diagnosis not present

## 2019-07-20 DIAGNOSIS — Z8639 Personal history of other endocrine, nutritional and metabolic disease: Secondary | ICD-10-CM

## 2019-07-20 DIAGNOSIS — Z13220 Encounter for screening for lipoid disorders: Secondary | ICD-10-CM | POA: Diagnosis not present

## 2019-07-20 DIAGNOSIS — E559 Vitamin D deficiency, unspecified: Secondary | ICD-10-CM

## 2019-07-20 NOTE — Patient Instructions (Signed)

## 2019-07-20 NOTE — Progress Notes (Signed)
DORRENE BENTLY 17-Sep-1966 [id number]    History:    Presents for annual exam.  Monthly cycle, denies any skipped cycles or menopausal symptoms, condoms.  Same partner..  Normal mammogram history, CIN-1 approximately 10 years ago normal Paps after.  2018 9 cm colon cancer.  09/2018 benign colon polyp 3-year follow-up scheduled.    medical history, past surgical history, family history and social history were all reviewed and documented in the EPIC chart.  Geophysicist/field seismologist.  [name] 21 graduate school doing well.  ROS:  A ROS was performed and pertinent positives and negatives are included.  Exam:  Vitals:   07/20/19 0844  BP: 110/80  Weight: 202 lb (91.6 kg)  Height: 5\' 2"  (1.575 m)   Body mass index is 36.95 kg/m.   General appearance:  Normal Thyroid:  Symmetrical, normal in size, without palpable masses or nodularity. Respiratory  Auscultation:  Clear without wheezing or rhonchi Cardiovascular  Auscultation:  Regular rate, without rubs, murmurs or gallops  Edema/varicosities:  Not grossly evident Abdominal  Soft,nontender, without masses, guarding or rebound.  Liver/spleen:  No organomegaly noted  Hernia:  None appreciated  Skin  Inspection:  Grossly normal   Breasts: Examined lying and sitting.     Right: Without masses, retractions, discharge or axillary adenopathy.     Left: Without masses, retractions, discharge or axillary adenopathy. Gentitourinary   Inguinal/mons:  Normal without inguinal adenopathy  External genitalia:  Normal  BUS/Urethra/Skene's glands:  Normal  Vagina:  Normal  Cervix:  Normal  Uterus:   normal in size, shape and contour.  Midline and mobile  Adnexa/parametria:     Rt: Without masses or tenderness.   Lt: Without masses or tenderness.  Anus and perineum: Normal  Digital rectal exam: Normal sphincter tone without palpated masses or tenderness  Assessment/Plan:  53 y.o. D WF G1, P1 for annual exam with no  complaints.  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  Menopause reviewed.  Reviewed importance of increasing regular cardio and weightbearing type exercise, decreasing calories, leisure activities.. CBC, CMP, lipid panel, vitamin D, TSH, Pap without HPV typing per request, normal Pap with negative HR HPV 2018.  [place], 8:54 AM 07/20/2019

## 2019-07-21 ENCOUNTER — Encounter: Payer: Self-pay | Admitting: Women's Health

## 2019-07-21 LAB — LIPID PANEL
Cholesterol: 219 mg/dL — ABNORMAL HIGH (ref <200)
HDL: 53 mg/dL (ref >=50)
LDL Cholesterol (Calc): 140 mg/dL (calc) — ABNORMAL HIGH
Non-HDL Cholesterol (Calc): 166 mg/dL (calc) — ABNORMAL HIGH (ref <130)
Total CHOL/HDL Ratio: 4.1 (calc) (ref <5.0)
Triglycerides: 135 mg/dL (ref <150)

## 2019-07-21 LAB — CBC WITH DIFFERENTIAL/PLATELET
Absolute Monocytes: 526 cells/uL (ref 200–950)
Basophils Absolute: 29 cells/uL (ref 0–200)
Basophils Relative: 0.4 %
Eosinophils Absolute: 209 cells/uL (ref 15–500)
Eosinophils Relative: 2.9 %
HCT: 44.1 % (ref 35.0–45.0)
Hemoglobin: 14.4 g/dL (ref 11.7–15.5)
Lymphs Abs: 2347 cells/uL (ref 850–3900)
MCH: 29.8 pg (ref 27.0–33.0)
MCHC: 32.7 g/dL (ref 32.0–36.0)
MCV: 91.1 fL (ref 80.0–100.0)
MPV: 10.9 fL (ref 7.5–12.5)
Monocytes Relative: 7.3 %
Neutro Abs: 4090 cells/uL (ref 1500–7800)
Neutrophils Relative %: 56.8 %
Platelets: 293 10*3/uL (ref 140–400)
RBC: 4.84 10*6/uL (ref 3.80–5.10)
RDW: 13.1 % (ref 11.0–15.0)
Total Lymphocyte: 32.6 %
WBC: 7.2 10*3/uL (ref 3.8–10.8)

## 2019-07-21 LAB — COMPREHENSIVE METABOLIC PANEL
AG Ratio: 1.2 (calc) (ref 1.0–2.5)
ALT: 25 U/L (ref 6–29)
AST: 20 U/L (ref 10–35)
Albumin: 4.2 g/dL (ref 3.6–5.1)
Alkaline phosphatase (APISO): 68 U/L (ref 37–153)
BUN: 15 mg/dL (ref 7–25)
CO2: 24 mmol/L (ref 20–32)
Calcium: 9.6 mg/dL (ref 8.6–10.4)
Chloride: 103 mmol/L (ref 98–110)
Creat: 0.7 mg/dL (ref 0.50–1.05)
Globulin: 3.4 g/dL (calc) (ref 1.9–3.7)
Glucose, Bld: 105 mg/dL — ABNORMAL HIGH (ref 65–99)
Potassium: 4.2 mmol/L (ref 3.5–5.3)
Sodium: 138 mmol/L (ref 135–146)
Total Bilirubin: 0.5 mg/dL (ref 0.2–1.2)
Total Protein: 7.6 g/dL (ref 6.1–8.1)

## 2019-07-21 LAB — PAP IG W/ RFLX HPV ASCU

## 2019-07-21 LAB — VITAMIN D 25 HYDROXY (VIT D DEFICIENCY, FRACTURES): Vit D, 25-Hydroxy: 54 ng/mL (ref 30–100)

## 2019-07-21 LAB — TSH: TSH: 3.4 mIU/L

## 2019-08-05 DIAGNOSIS — L603 Nail dystrophy: Secondary | ICD-10-CM | POA: Diagnosis not present

## 2019-08-05 DIAGNOSIS — L814 Other melanin hyperpigmentation: Secondary | ICD-10-CM | POA: Diagnosis not present

## 2019-08-05 DIAGNOSIS — L821 Other seborrheic keratosis: Secondary | ICD-10-CM | POA: Diagnosis not present

## 2019-08-05 DIAGNOSIS — D225 Melanocytic nevi of trunk: Secondary | ICD-10-CM | POA: Diagnosis not present

## 2019-08-06 ENCOUNTER — Encounter: Payer: Self-pay | Admitting: Women's Health

## 2019-08-06 DIAGNOSIS — Z1231 Encounter for screening mammogram for malignant neoplasm of breast: Secondary | ICD-10-CM | POA: Diagnosis not present

## 2019-10-14 ENCOUNTER — Encounter: Payer: Self-pay | Admitting: Women's Health

## 2019-11-03 DIAGNOSIS — M20011 Mallet finger of right finger(s): Secondary | ICD-10-CM | POA: Diagnosis not present

## 2019-11-09 DIAGNOSIS — M25641 Stiffness of right hand, not elsewhere classified: Secondary | ICD-10-CM | POA: Diagnosis not present

## 2019-11-09 DIAGNOSIS — M79644 Pain in right finger(s): Secondary | ICD-10-CM | POA: Diagnosis not present

## 2019-11-09 DIAGNOSIS — M20011 Mallet finger of right finger(s): Secondary | ICD-10-CM | POA: Diagnosis not present

## 2019-11-16 DIAGNOSIS — M25641 Stiffness of right hand, not elsewhere classified: Secondary | ICD-10-CM | POA: Diagnosis not present

## 2019-11-16 DIAGNOSIS — M20011 Mallet finger of right finger(s): Secondary | ICD-10-CM | POA: Diagnosis not present

## 2019-11-16 DIAGNOSIS — M79644 Pain in right finger(s): Secondary | ICD-10-CM | POA: Diagnosis not present

## 2020-02-26 ENCOUNTER — Ambulatory Visit: Payer: BLUE CROSS/BLUE SHIELD

## 2020-03-14 ENCOUNTER — Ambulatory Visit: Payer: Self-pay | Attending: Internal Medicine

## 2020-03-14 DIAGNOSIS — Z23 Encounter for immunization: Secondary | ICD-10-CM

## 2020-03-14 NOTE — Progress Notes (Signed)
   Covid-19 Vaccination Clinic  Name:  Savannah Cook    MRN: [id number] DOB: [birth year]  03/14/2020  [name] was observed post Covid-19 immunization for 15 minutes without incident. She was provided with Vaccine Information Sheet and instruction to access the V-Safe system.   [name] was instructed to call 911 with any severe reactions post vaccine: Marland Kitchen Difficulty breathing  . Swelling of face and throat  . A fast heartbeat  . A bad rash all over body  . Dizziness and weakness   Immunizations Administered    Name Date Dose VIS Date Route   Pfizer COVID-19 Vaccine 03/14/2020 11:29 AM 0.3 mL 11/27/2019 Intramuscular   Manufacturer: [place]   Lot: [id number]   [place]: [id number]

## 2020-04-04 ENCOUNTER — Institutional Professional Consult (permissible substitution): Payer: BC Managed Care – PPO | Admitting: Women's Health

## 2020-04-06 ENCOUNTER — Ambulatory Visit: Payer: Self-pay | Attending: Internal Medicine

## 2020-04-06 DIAGNOSIS — Z23 Encounter for immunization: Secondary | ICD-10-CM

## 2020-04-06 NOTE — Progress Notes (Signed)
   Covid-19 Vaccination Clinic  Name:  KAILI BIEBER    MRN: [id number] DOB: 09-29-1966  04/06/2020  Ms. Michelsen was observed post Covid-19 immunization for 15 minutes without incident. She was provided with Vaccine Information Sheet and instruction to access the V-Safe system.   Ms. Aust was instructed to call 911 with any severe reactions post vaccine: Marland Kitchen Difficulty breathing  . Swelling of face and throat  . A fast heartbeat  . A bad rash all over body  . Dizziness and weakness   Immunizations Administered    Name Date Dose VIS Date Route   Pfizer COVID-19 Vaccine 04/06/2020  3:42 PM 0.3 mL 02/10/2019 Intramuscular   Manufacturer: [place]   Lot: [id number]   [place]: [id number]

## 2020-04-12 ENCOUNTER — Institutional Professional Consult (permissible substitution): Payer: BC Managed Care – PPO | Admitting: Women's Health

## 2020-04-14 ENCOUNTER — Encounter: Payer: Self-pay | Admitting: Internal Medicine

## 2020-04-14 ENCOUNTER — Ambulatory Visit (INDEPENDENT_AMBULATORY_CARE_PROVIDER_SITE_OTHER): Payer: BC Managed Care – PPO | Admitting: Internal Medicine

## 2020-04-14 ENCOUNTER — Other Ambulatory Visit: Payer: Self-pay

## 2020-04-14 VITALS — BP 125/94 | HR 69 | Temp 97.4°F | Resp 18 | Ht 62.0 in | Wt 202.2 lb

## 2020-04-14 DIAGNOSIS — Z85038 Personal history of other malignant neoplasm of large intestine: Secondary | ICD-10-CM

## 2020-04-14 DIAGNOSIS — Z862 Personal history of diseases of the blood and blood-forming organs and certain disorders involving the immune mechanism: Secondary | ICD-10-CM

## 2020-04-14 DIAGNOSIS — R635 Abnormal weight gain: Secondary | ICD-10-CM

## 2020-04-14 DIAGNOSIS — E669 Obesity, unspecified: Secondary | ICD-10-CM

## 2020-04-14 DIAGNOSIS — F419 Anxiety disorder, unspecified: Secondary | ICD-10-CM

## 2020-04-14 DIAGNOSIS — M7062 Trochanteric bursitis, left hip: Secondary | ICD-10-CM

## 2020-04-14 LAB — CBC WITH DIFFERENTIAL/PLATELET
Basophils Absolute: 0 10*3/uL (ref 0.0–0.1)
Basophils Relative: 0.6 % (ref 0.0–3.0)
Eosinophils Absolute: 0.2 10*3/uL (ref 0.0–0.7)
Eosinophils Relative: 3.1 % (ref 0.0–5.0)
HCT: 41.6 % (ref 36.0–46.0)
Hemoglobin: 13.9 g/dL (ref 12.0–15.0)
Lymphocytes Relative: 33.8 % (ref 12.0–46.0)
Lymphs Abs: 2.3 10*3/uL (ref 0.7–4.0)
MCHC: 33.5 g/dL (ref 30.0–36.0)
MCV: 89.4 fl (ref 78.0–100.0)
Monocytes Absolute: 0.5 10*3/uL (ref 0.1–1.0)
Monocytes Relative: 7.6 % (ref 3.0–12.0)
Neutro Abs: 3.7 10*3/uL (ref 1.4–7.7)
Neutrophils Relative %: 54.9 % (ref 43.0–77.0)
Platelets: 250 10*3/uL (ref 150.0–400.0)
RBC: 4.65 Mil/uL (ref 3.87–5.11)
RDW: 13.4 % (ref 11.5–15.5)
WBC: 6.8 10*3/uL (ref 4.0–10.5)

## 2020-04-14 LAB — HEMOGLOBIN A1C: Hgb A1c MFr Bld: 5.8 % (ref 4.6–6.5)

## 2020-04-14 MED ORDER — IBUPROFEN 800 MG PO TABS: 800.0000 mg | ORAL_TABLET | Freq: Every day | ORAL | 0 refills | Status: DC | PRN

## 2020-04-14 NOTE — Patient Instructions (Addendum)
Weight management:  Weight watchers? NOOM?  Wellness center?  Calorie counting?   For hip pain: You possibly trochanteric bursitis. Place ice on the area of pain every night Okay to take ibuprofen 800 mg once daily IBUPROFEN (Advil or Motrin) 200 mg 2 tablets every 6 hours as needed for pain.  Always take it with food because may cause gastritis and ulcers.  If you notice nausea, stomach pain, change in the color of stools --->  Stop the medicine and let us know See your orthopedic doctor if you are not improving   GO TO THE LAB : Get the blood work     Savannah Cook, Savannah Cook back for   a checkup in 6 months

## 2020-04-14 NOTE — Progress Notes (Addendum)
Subjective:    Patient ID: Savannah Cook, female    DOB: [birth year], 54 y.o.   MRN: [id number]  DOS:  04/14/2020 Type of visit - description: New patient. Has multiple concerns.  Her main problem is weight gain over the last year. Has a family history of diabetes and would like to be checked. Also concerned about her history of anemia. Has developed pain at the lateral side of the left hip, worse with walking, no back pain per se. Also reports heavy snoring, tiredness and feeling sleepy.  BP Readings from Last 3 Encounters:  04/14/20 (!) 125/94  07/20/19 110/80  10/09/18 (!) 146/86     Review of Systems Denies chest pain or difficulty breathing although when he goes up stairs she feels a slightly winded, she thinks is due to her weight and being deconditioned, has not been exercising. Denies nausea, vomiting, diarrhea.  No blood in the stools  Past Medical History:  Diagnosis Date  . Anxiety   . Colon cancer ([place]) 06/10/2017  . GERD (gastroesophageal reflux disease)   . Hx of colonic polyp - sessile serrated 10/08/2018  . Iron deficiency anemia due to chronic blood loss    colon cancer  . Seasonal allergies     Past Surgical History:  Procedure Laterality Date  . CHOLECYSTECTOMY N/A 07/25/2017   Procedure: CHOLECYSTECTOMY;  Surgeon: Jackolyn Confer, MD;  Location: [facility];  Service: General;  Laterality: N/A;  . COLONOSCOPY  06/10/2018   w/Dr.Gessner   . LAPAROSCOPIC PARTIAL COLECTOMY N/A 07/25/2017   Procedure: LAPAROSCOPIC PARTIAL COLECTOMY;  Surgeon: Jackolyn Confer, MD;  Location: [facility];  Service: General;  Laterality: N/A;  . UPPER GASTROINTESTINAL ENDOSCOPY  06/10/2018  . WISDOM TOOTH EXTRACTION    . WRIST SURGERY Right 04/2012   Family History  Problem Relation Age of Onset  . Diabetes Mother   . Hypertension Mother   . Bipolar disorder Mother   . COPD Mother   . Diabetes Father   . Hypertension Father   . Lung cancer Father   . Colon cancer Maternal  Grandfather 95  . Heart disease Maternal Grandmother   . Colon polyps Neg Hx   . Esophageal cancer Neg Hx   . Rectal cancer Neg Hx   . Stomach cancer Neg Hx     Allergies as of 04/14/2020      Reactions   Ceclor [cefaclor] Rash      Medication List       Accurate as of April 14, 2020  1:10 PM. If you have any questions, ask your nurse or doctor.        STOP taking these medications   diclofenac 75 MG EC tablet Commonly known as: VOLTAREN Stopped by: Kathlene November, MD   fluconazole 100 MG tablet Commonly known as: DIFLUCAN Stopped by: Kathlene November, MD   multivitamin capsule Stopped by: Kathlene November, MD   VITAMIN D PO Stopped by: Kathlene November, MD     TAKE these medications   CHARCOAL PO Take by mouth. Gallbladder/Digestive   CINNAMON PO Take by mouth.   ibuprofen 800 MG tablet Commonly known as: ADVIL Take 1 tablet (800 mg total) by mouth daily as needed. Started by: Kathlene November, MD   OVER THE COUNTER MEDICATION [place] C for spleen and liver function   OVER THE COUNTER MEDICATION [place] Whole Food vitamins   OVER THE COUNTER MEDICATION Zyflamend Whole Body Herbal   OVER THE COUNTER MEDICATION [place]   OVER THE COUNTER MEDICATION  Albuplex- Kidney and Immune System   TURMERIC PO Take by mouth.          Objective:   Physical Exam BP (!) 125/94 (BP Location: Right Arm, Patient Position: Sitting, Cuff Size: Normal)   Pulse 69   Temp (!) 97.4 F (36.3 C) (Temporal)   Resp 18   Ht 5\' 2"  (1.575 m)   Wt 202 lb 4 oz (91.7 kg)   LMP 03/17/2020   SpO2 94%   BMI 36.99 kg/m  General:   Well developed, NAD, BMI noted. HEENT:  Normocephalic . Face symmetric, atraumatic Neck: No thyromegaly Lungs:  CTA B Normal respiratory effort, no intercostal retractions, no accessory muscle use. Heart: RRR,  no murmur.  Lower extremities: no pretibial edema bilaterally MSK: No TTP at the lumbar sacral area. Slightly TTP at the left trochanteric bursa. Hip rotation  normal Skin: Not pale. Not jaundice Neurologic:  alert & oriented X3.  Speech normal, gait appropriate for age and unassisted Psych--  Cognition and judgment appear intact.  Cooperative with normal attention span and concentration.  Behavior appropriate. No anxious or depressed appearing.      Assessment    Assessment Dx colon cancer,2018, s/p surgery, no chemo;  GI Dr. Carlean Purl, colonoscopy 09-2018 GERD   Note menopausal as of 03-2020.   PLAN  New patient, here to get established, I used to see her parents more than 10 years ago. Her primary provider was Elon Alas, NP, she has retired but she plans to continue getting her gynecological care at that office.  Multiple issues discussed today: Weight gain, obesity: Her weight 2 years ago was 175, today is 202, BMI is 36.9. She admits to not eating healthy and not exercising. TSH has been stable for years. Plan: Extensive discussion about diet, exercise.  Weight watchers? NOOM?  Wellness center?  Calorie counting?  Recommend to minimize supplements and focus on vitamin D, calcium. Check a A1c Trochanteric bursitis: Left hip pain as described above consistent with trochanteric bursitis, we agreed on local ice and ibuprofen (  GI precautions discussed) and recommend to see Ortho or sports medicine if no better. History of anemia and colon cancer: No symptoms, check a CBC.Marland Kitchen Snoring: History of heavy snoring for a while, reportedly had a negative sleep study for sleep apnea in 2008. Epworth scale today 7 (average).  Recommend work on weight loss and observation, reassess periodically. Anxiety: Also reports she is under a lot of stress for a number of reasons, listening therapy provided, needs to see a counselor, numbers provided. RTC 62months   This visit occurred during the SARS-CoV-2 public health emergency.  Safety protocols were in place, including screening questions prior to the visit, additional usage of staff PPE, and extensive  cleaning of exam room while observing appropriate contact time as indicated for disinfecting solutions.

## 2020-04-14 NOTE — Progress Notes (Signed)
Pre visit review using our clinic review tool, if applicable. No additional management support is needed unless otherwise documented below in the visit note. 

## 2020-04-15 ENCOUNTER — Ambulatory Visit: Payer: Self-pay | Admitting: Internal Medicine

## 2020-06-28 DIAGNOSIS — L814 Other melanin hyperpigmentation: Secondary | ICD-10-CM | POA: Diagnosis not present

## 2020-06-28 DIAGNOSIS — D225 Melanocytic nevi of trunk: Secondary | ICD-10-CM | POA: Diagnosis not present

## 2020-06-28 DIAGNOSIS — L821 Other seborrheic keratosis: Secondary | ICD-10-CM | POA: Diagnosis not present

## 2020-06-28 DIAGNOSIS — L578 Other skin changes due to chronic exposure to nonionizing radiation: Secondary | ICD-10-CM | POA: Diagnosis not present

## 2020-07-25 ENCOUNTER — Ambulatory Visit: Payer: BC Managed Care – PPO | Admitting: Nurse Practitioner

## 2020-07-25 ENCOUNTER — Encounter: Payer: Self-pay | Admitting: Nurse Practitioner

## 2020-07-25 ENCOUNTER — Other Ambulatory Visit: Payer: Self-pay

## 2020-07-25 VITALS — BP 126/82 | Ht 62.0 in | Wt 205.0 lb

## 2020-07-25 DIAGNOSIS — Z3009 Encounter for other general counseling and advice on contraception: Secondary | ICD-10-CM | POA: Diagnosis not present

## 2020-07-25 DIAGNOSIS — Z01419 Encounter for gynecological examination (general) (routine) without abnormal findings: Secondary | ICD-10-CM

## 2020-07-25 NOTE — Progress Notes (Signed)
   Savannah Cook Oct 17, 1966 [id number]   History:  54 y.o. G1P1  presents for annual exam. Having mostly regular monthly cycles with no menopausal symptoms. Missed a cycle in June. She is worried about pregnancy and currently uses no contraception. She would like to discuss options. Normal mammogram history. 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 2018 colon cancer, colectomy with cholecystectomy.   Gynecologic History Patient's last menstrual period was 07/09/2020. Period Cycle (Days): 28 Period Duration (Days): 5 Period Pattern: Regular Menstrual Flow: Moderate Menstrual Control: Maxi pad, Tampon Dysmenorrhea: (!) Mild Dysmenorrhea Symptoms: Cramping Contraception: rhythm method Last Pap: 07/20/2019. Results were: normal Last mammogram: 08/06/2019. Results were: normal Last colonoscopy: 09/29/2018. Results were: normal   Past medical history, past surgical history, family history and social history were all reviewed and documented in the EPIC chart.  ROS:  A ROS was performed and pertinent positives and negatives are included.  Exam:  Vitals:   07/25/20 0847  BP: 126/82  Weight: 205 lb (93 kg)  Height: 5\' 2"  (1.575 m)   Body mass index is 37.49 kg/m.  General appearance:  Normal Thyroid:  Symmetrical, normal in size, without palpable masses or nodularity. Respiratory  Auscultation:  Clear without wheezing or rhonchi Cardiovascular  Auscultation:  Regular rate, without rubs, murmurs or gallops  Edema/varicosities:  Not grossly evident Abdominal  Soft,nontender, without masses, guarding or rebound.  Liver/spleen:  No organomegaly noted  Hernia:  None appreciated  Skin  Inspection:  Grossly normal   Breasts: Examined lying and sitting.   Right: Without masses, retractions, discharge or axillary adenopathy.   Left: Without masses, retractions, discharge or axillary adenopathy. Gentitourinary   Inguinal/mons:  Normal without inguinal adenopathy  External genitalia:   Normal  BUS/Urethra/Skene's glands:  Normal  Vagina:  Normal  Cervix:  Normal  Uterus:  Anteverted, normal in size, shape and contour.  Midline and mobile  Adnexa/parametria:     Rt: Without masses or tenderness.   Lt: Without masses or tenderness.  Anus and perineum: Normal  Digital rectal exam: Normal sphincter tone without palpated masses or tenderness  Assessment/Plan:  53 y.o. G1P1 for annual exam.   Well female exam with routine gynecological exam - Education provided on SBEs, importance of preventative screenings, current guidelines, high calcium diet, regular exercise, and multivitamin daily.   Screening for cervical cancer - CIN 1 10-15 years ago per patient with normal subsequent paps. Will repeat pap next year.   Encounter for other general counseling and advice on contraception - Discussed contraception options to include pill, patch, vaginal ring, injectable, implant, and IUD. She is not having any menopausal symptoms and has only missed 1 cycle. She would like contraception for pregnancy prevention. She is interested in [place]. We will check coverage and schedule insertion with Dr. Dellis Filbert during her next menses if approved. She is agreeable to plan.  Follow up in 1 year for annual        [place], 9:05 AM 07/25/2020

## 2020-07-25 NOTE — Patient Instructions (Signed)
Levonorgestrel intrauterine device (IUD) What is this medicine? LEVONORGESTREL IUD (LEE voe nor jes trel) is a contraceptive (birth control) device. The device is placed inside the uterus by a healthcare professional. It is used to prevent pregnancy. This device can also be used to treat heavy bleeding that occurs during your period. This medicine may be used for other purposes; ask your health care provider or pharmacist if you have questions. COMMON BRAND NAME(S): Kyleena, LILETTA, Mirena, Skyla What should I tell my health care provider before I take this medicine? They need to know if you have any of these conditions:  abnormal Pap smear  cancer of the breast, uterus, or cervix  diabetes  endometritis  genital or pelvic infection now or in the past  have more than one sexual partner or your partner has more than one partner  heart disease  history of an ectopic or tubal pregnancy  immune system problems  IUD in place  liver disease or tumor  problems with blood clots or take blood-thinners  seizures  use intravenous drugs  uterus of unusual shape  vaginal bleeding that has not been explained  an unusual or allergic reaction to levonorgestrel, other hormones, silicone, or polyethylene, medicines, foods, dyes, or preservatives  pregnant or trying to get pregnant  breast-feeding How should I use this medicine? This device is placed inside the uterus by a health care professional. Talk to your pediatrician regarding the use of this medicine in children. Special care may be needed. Overdosage: If you think you have taken too much of this medicine contact a poison control center or emergency room at once. NOTE: This medicine is only for you. Do not share this medicine with others. What if I miss a dose? This does not apply. Depending on the brand of device you have inserted, the device will need to be replaced every 3 to 6 years if you wish to continue using this type  of birth control. What may interact with this medicine? Do not take this medicine with any of the following medications:  amprenavir  bosentan  fosamprenavir This medicine may also interact with the following medications:  aprepitant  armodafinil  barbiturate medicines for inducing sleep or treating seizures  bexarotene  boceprevir  griseofulvin  medicines to treat seizures like carbamazepine, ethotoin, felbamate, oxcarbazepine, phenytoin, topiramate  modafinil  pioglitazone  rifabutin  rifampin  rifapentine  some medicines to treat HIV infection like atazanavir, efavirenz, indinavir, lopinavir, nelfinavir, tipranavir, ritonavir  St. John's wort  warfarin This list may not describe all possible interactions. Give your health care provider a list of all the medicines, herbs, non-prescription drugs, or dietary supplements you use. Also tell them if you smoke, drink alcohol, or use illegal drugs. Some items may interact with your medicine. What should I watch for while using this medicine? Visit your doctor or health care professional for regular check ups. See your doctor if you or your partner has sexual contact with others, becomes HIV positive, or gets a sexual transmitted disease. This product does not protect you against HIV infection (AIDS) or other sexually transmitted diseases. You can check the placement of the IUD yourself by reaching up to the top of your vagina with clean fingers to feel the threads. Do not pull on the threads. It is a good habit to check placement after each menstrual period. Call your doctor right away if you feel more of the IUD than just the threads or if you cannot feel the threads at   all. The IUD may come out by itself. You may become pregnant if the device comes out. If you notice that the IUD has come out use a backup birth control method like condoms and call your health care provider. Using tampons will not change the position of the  IUD and are okay to use during your period. This IUD can be safely scanned with magnetic resonance imaging (MRI) only under specific conditions. Before you have an MRI, tell your healthcare provider that you have an IUD in place, and which type of IUD you have in place. What side effects may I notice from receiving this medicine? Side effects that you should report to your doctor or health care professional as soon as possible:  allergic reactions like skin rash, itching or hives, swelling of the face, lips, or tongue  fever, flu-like symptoms  genital sores  high blood pressure  no menstrual period for 6 weeks during use  pain, swelling, warmth in the leg  pelvic pain or tenderness  severe or sudden headache  signs of pregnancy  stomach cramping  sudden shortness of breath  trouble with balance, talking, or walking  unusual vaginal bleeding, discharge  yellowing of the eyes or skin Side effects that usually do not require medical attention (report to your doctor or health care professional if they continue or are bothersome):  acne  breast pain  change in sex drive or performance  changes in weight  cramping, dizziness, or faintness while the device is being inserted  headache  irregular menstrual bleeding within first 3 to 6 months of use  nausea This list may not describe all possible side effects. Call your doctor for medical advice about side effects. You may report side effects to FDA at 1-800-FDA-1088. Where should I keep my medicine? This does not apply. NOTE: This sheet is a summary. It may not cover all possible information. If you have questions about this medicine, talk to your doctor, pharmacist, or health care provider.  2020 Elsevier/Gold Standard (2018-10-14 13:22:01) Perimenopause  Perimenopause is the normal time of life before and after menstrual periods stop completely (menopause). Perimenopause can begin 2-8 years before menopause, and it  usually lasts for 1 year after menopause. During perimenopause, the ovaries may or may not produce an egg. What are the causes? This condition is caused by a natural change in hormone levels that happens as you get older. What increases the risk? This condition is more likely to start at an earlier age if you have certain medical conditions or treatments, including:  A tumor of the pituitary gland in the brain.  A disease that affects the ovaries and hormone production.  Radiation treatment for cancer.  Certain cancer treatments, such as chemotherapy or hormone (anti-estrogen) therapy.  Heavy smoking and excessive alcohol use.  Family history of early menopause. What are the signs or symptoms? Perimenopausal changes affect each woman differently. Symptoms of this condition may include:  Hot flashes.  Night sweats.  Irregular menstrual periods.  Decreased sex drive.  Vaginal dryness.  Headaches.  Mood swings.  Depression.  Memory problems or trouble concentrating.  Irritability.  Tiredness.  Weight gain.  Anxiety.  Trouble getting pregnant. How is this diagnosed? This condition is diagnosed based on your medical history, a physical exam, your age, your menstrual history, and your symptoms. Hormone tests may also be done. How is this treated? In some cases, no treatment is needed. You and your health care provider should make a decision together  about whether treatment is necessary. Treatment will be based on your individual condition and preferences. Various treatments are available, such as:  Menopausal hormone therapy (MHT).  Medicines to treat specific symptoms.  Acupuncture.  Vitamin or herbal supplements. Before starting treatment, make sure to let your health care provider know if you have a personal or family history of:  Heart disease.  Breast cancer.  Blood clots.  Diabetes.  Osteoporosis. Follow these instructions at  home: Lifestyle  Do not use any products that contain nicotine or tobacco, such as cigarettes and e-cigarettes. If you need help quitting, ask your health care provider.  Eat a balanced diet that includes fresh fruits and vegetables, whole grains, soybeans, eggs, lean meat, and low-fat dairy.  Get at least 30 minutes of physical activity on 5 or more days each week.  Avoid alcoholic and caffeinated beverages, as well as spicy foods. This may help prevent hot flashes.  Get 7-8 hours of sleep each night.  Dress in layers that can be removed to help you manage hot flashes.  Find ways to manage stress, such as deep breathing, meditation, or journaling. General instructions  Keep track of your menstrual periods, including: ? When they occur. ? How heavy they are and how long they last. ? How much time passes between periods.  Keep track of your symptoms, noting when they start, how often you have them, and how long they last.  Take over-the-counter and prescription medicines only as told by your health care provider.  Take vitamin supplements only as told by your health care provider. These may include calcium, vitamin E, and vitamin D.  Use vaginal lubricants or moisturizers to help with vaginal dryness and improve comfort during sex.  Talk with your health care provider before starting any herbal supplements.  Keep all follow-up visits as told by your health care provider. This is important. This includes any group therapy or counseling. Contact a health care provider if:  You have heavy vaginal bleeding or pass blood clots.  Your period lasts more than 2 days longer than normal.  Your periods are recurring sooner than 21 days.  You bleed after having sex. Get help right away if:  You have chest pain, trouble breathing, or trouble talking.  You have severe depression.  You have pain when you urinate.  You have severe headaches.  You have vision  problems. Summary  Perimenopause is the time when a woman's body begins to move into menopause. This may happen naturally or as a result of other health problems or medical treatments.  Perimenopause can begin 2-8 years before menopause, and it usually lasts for 1 year after menopause.  Perimenopausal symptoms can be managed through medicines, lifestyle changes, and complementary therapies such as acupuncture. This information is not intended to replace advice given to you by your health care provider. Make sure you discuss any questions you have with your health care provider. Document Revised: 11/15/2017 Document Reviewed: 01/08/2017 Elsevier Patient Education  2020 [employer].

## 2020-08-10 DIAGNOSIS — Z1231 Encounter for screening mammogram for malignant neoplasm of breast: Secondary | ICD-10-CM | POA: Diagnosis not present

## 2020-08-15 ENCOUNTER — Encounter: Payer: Self-pay | Admitting: Nurse Practitioner

## 2020-09-01 ENCOUNTER — Encounter: Payer: Self-pay | Admitting: Nurse Practitioner

## 2020-09-01 DIAGNOSIS — R928 Other abnormal and inconclusive findings on diagnostic imaging of breast: Secondary | ICD-10-CM | POA: Diagnosis not present

## 2020-09-16 ENCOUNTER — Telehealth: Payer: Self-pay | Admitting: Internal Medicine

## 2020-09-16 DIAGNOSIS — T63481A Toxic effect of venom of other arthropod, accidental (unintentional), initial encounter: Secondary | ICD-10-CM | POA: Diagnosis not present

## 2020-09-16 DIAGNOSIS — L03113 Cellulitis of right upper limb: Secondary | ICD-10-CM | POA: Diagnosis not present

## 2020-09-16 NOTE — Telephone Encounter (Signed)
While I cannot officially recommend any patient take medicine that was not prescribed to them, clinicians will commonly prescribe prednisone to patients for bee stings. Ty.

## 2020-09-16 NOTE — Telephone Encounter (Signed)
Please advise 

## 2020-09-16 NOTE — Telephone Encounter (Signed)
LMOM informing Pt not to take dog prednisone (per Dr. Yong Channel), try ice, OTC claritin or similar medication. If not improving will need to be seen at urgent care over the weekend. Instructed to call if questions/concerns.

## 2020-09-16 NOTE — Telephone Encounter (Signed)
Caller: Alliana Call back # [phone]  Patient states she was stung by a wasp yesterday around 05:00pm on her arm. Patient states her arm is swelling . She took some benadryl yesterday and ibuprofen, but don't want to continue to take benadryl because she has to drive.  Patient states she has some prednisone at home that was prescribed to her dog, she is wondering if she could take it. She does not want to go to an urgent care. She just wants to know what can she take?

## 2020-10-12 ENCOUNTER — Ambulatory Visit: Payer: BC Managed Care – PPO | Admitting: Internal Medicine

## 2020-10-12 ENCOUNTER — Telehealth: Payer: Self-pay

## 2020-10-12 NOTE — Telephone Encounter (Signed)
Caller states she wants reschedule appt. for 10/27 at 4pm due to work.  Telephone: [phone]

## 2020-10-12 NOTE — Telephone Encounter (Signed)
Pt rescheduled

## 2020-10-12 NOTE — Telephone Encounter (Signed)
LVM for pt to call the office to reschedule her appt at her convenient time.

## 2020-10-13 ENCOUNTER — Ambulatory Visit: Payer: BC Managed Care – PPO | Admitting: Internal Medicine

## 2020-10-14 ENCOUNTER — Ambulatory Visit: Payer: BC Managed Care – PPO | Admitting: Internal Medicine

## 2020-10-21 ENCOUNTER — Telehealth: Payer: Self-pay | Admitting: Internal Medicine

## 2020-10-21 NOTE — Telephone Encounter (Signed)
Pt is requesting a call back from a nurse in regards to her colonoscopy, pt has a recall for 2022 but for insurance purposes pt is trying to have it done sometime before the end of this year.

## 2020-10-24 NOTE — Telephone Encounter (Signed)
She has scheduled an appointment to review this

## 2020-10-27 ENCOUNTER — Ambulatory Visit: Payer: BC Managed Care – PPO | Admitting: Internal Medicine

## 2020-12-23 ENCOUNTER — Telehealth: Payer: Self-pay | Admitting: Internal Medicine

## 2020-12-23 NOTE — Telephone Encounter (Signed)
Savannah Cook this patient wants to know how her procedure recall colon will be coded? Would it be hx of colon cancer or surveillance? States that her last one was good after the cancer but she wants to know before she schedules. Patient also wants to know if she can schedule before recall date October of this year. I can check that with her insurance companies but wanted to know what Dr Carlean Purl suggest.

## 2020-12-23 NOTE — Telephone Encounter (Signed)
Called and discussed with the patient that she will always have the personal hx of colon cancer ICD 10 code attached to her high risk screening colonoscopy. We discussed that it will be up to the insurance company to decide how to pay.  If the insurance knows her hx and decides to cover at a screening benefit, if polyps are found, they may change it to a diagnostic procedure benefit once the billing is submitted.   She is advised that she could have procedure late summer.  She reports that she may be changing insurance and may want to have done before that point.  She is advised that she can call if this is the case and Dr. Carlean Purl will determine if too early.    Es , she said you were going to talk with her insurance to determine out of pocket.  She asked if you could ask about the scenario above of high risk screening with a personal hx of colon cancer, how will they cover it?  Will polyps change that coverage if it was pre-certified and accepted as a screening benefit?

## 2020-12-26 NOTE — Telephone Encounter (Signed)
We can do colonoscopy earlier than originally intended if desired

## 2021-01-13 ENCOUNTER — Ambulatory Visit: Payer: BC Managed Care – PPO | Admitting: Internal Medicine

## 2021-01-13 ENCOUNTER — Encounter: Payer: Self-pay | Admitting: Internal Medicine

## 2021-01-13 ENCOUNTER — Other Ambulatory Visit: Payer: Self-pay

## 2021-01-13 ENCOUNTER — Ambulatory Visit (INDEPENDENT_AMBULATORY_CARE_PROVIDER_SITE_OTHER): Payer: BC Managed Care – PPO | Admitting: Internal Medicine

## 2021-01-13 VITALS — BP 125/81 | HR 72 | Temp 98.1°F | Ht 62.0 in | Wt 184.0 lb

## 2021-01-13 DIAGNOSIS — F32A Depression, unspecified: Secondary | ICD-10-CM

## 2021-01-13 DIAGNOSIS — F419 Anxiety disorder, unspecified: Secondary | ICD-10-CM

## 2021-01-13 MED ORDER — DULOXETINE HCL 30 MG PO CPEP: 30.0000 mg | ORAL_CAPSULE | Freq: Every day | ORAL | 1 refills | Status: DC

## 2021-01-13 NOTE — Patient Instructions (Signed)
Start Cymbalta 30 mg 1 tablet daily.  Please consider see a counselor    GO TO THE FRONT DESK, [place] back for   a checkup in 4 weeks

## 2021-01-13 NOTE — Progress Notes (Signed)
Subjective:    Patient ID: Savannah Cook, female    DOB: 21-Jun-1966, 55 y.o.   MRN: [id number]  DOS:  01/13/2021 Type of visit - description: Acute  She broke with her boyfriend of 3 years shortly before Christmas, she described her relationship as very toxic.  After Christmas, she started to feel very anxious: Described her feeling as jittery, having a "knot" in the stomach,  panic attacks, decreased focus. She also feels lonely.  She however has a job and she has been able to fulfill all her obligations well.   Review of Systems Not on birth control, not menopausal, last menstrual: last month, not sexually active in several months. No s/i  Past Medical History:  Diagnosis Date  . Anxiety   . Colon cancer ([place]) 06/10/2017  . GERD (gastroesophageal reflux disease)   . Hx of colonic polyp - sessile serrated 10/08/2018  . Iron deficiency anemia due to chronic blood loss    colon cancer  . Seasonal allergies     Past Surgical History:  Procedure Laterality Date  . CHOLECYSTECTOMY N/A 07/25/2017   Procedure: CHOLECYSTECTOMY;  Surgeon: Jackolyn Confer, MD;  Location: [facility];  Service: General;  Laterality: N/A;  . COLONOSCOPY  06/10/2018   w/Dr.Gessner   . LAPAROSCOPIC PARTIAL COLECTOMY N/A 07/25/2017   Procedure: LAPAROSCOPIC PARTIAL COLECTOMY;  Surgeon: Jackolyn Confer, MD;  Location: [facility];  Service: General;  Laterality: N/A;  . UPPER GASTROINTESTINAL ENDOSCOPY  06/10/2018  . WISDOM TOOTH EXTRACTION    . WRIST SURGERY Right 04/2012    Allergies as of 01/13/2021      Reactions   Ceclor [cefaclor] Rash      Medication List       Accurate as of January 13, 2021 11:59 PM. If you have any questions, ask your nurse or doctor.        CINNAMON PO Take by mouth.   DULoxetine 30 MG capsule Commonly known as: Cymbalta Take 1 capsule (30 mg total) by mouth daily. Started by: Kathlene November, MD   ibuprofen 800 MG tablet Commonly known as: ADVIL Take 1 tablet (800 mg total)  by mouth daily as needed.   OVER THE COUNTER MEDICATION New Chapter Whole Food vitamins   OVER THE COUNTER MEDICATION Zyflamend Whole Body Herbal   TURMERIC PO Take by mouth.   VITAMIN B-12 PO Take 1 tablet by mouth daily.   VITAMIN D3 PO Take 1 tablet by mouth daily.          Objective:   Physical Exam BP 125/81 (BP Location: Right Arm, Patient Position: Sitting, Cuff Size: Large)   Pulse 72   Temp 98.1 F (36.7 C) (Oral)   Ht 5\' 2"  (1.575 m)   Wt 184 lb (83.5 kg)   SpO2 97%   BMI 33.65 kg/m  General:   Well developed, NAD, BMI noted. HEENT:  Normocephalic . Face symmetric, atraumatic Lower extremities: no pretibial edema bilaterally  Skin: Not pale. Not jaundice Neurologic:  alert & oriented X3.  Speech normal, gait appropriate for age and unassisted Psych--  Cognition and judgment appear intact.  Cooperative with normal attention span and concentration.  Behavior appropriate. Appears mild to moderately anxious, not depressed.     Assessment     Assessment Dx colon cancer,2018, s/p surgery, no chemo;  GI Dr. Carlean Purl, colonoscopy 09-2018 GERD   Note menopausal as of 03-2020.   PLAN  Anxiety> depression PHQ-9: 15 She was in "toxic" relationship for 3 years, that ended  in December, since then she has been anxious, depressed.  Strongly denies any suicidal ideas. Extensive listening therapy provided. We discussed treatment options, recommend fluoxetine or Cymbalta or BuSpar. At the end we agreed on Cymbalta 30 mg 1 tablet daily. Suicidality discussed. Information about counselors provided.  Cost likely to be an issue. Encouraged routine exercise. RTC 4 weeks  Time spent 32 minutes This visit occurred during the SARS-CoV-2 public health emergency.  Safety protocols were in place, including screening questions prior to the visit, additional usage of staff PPE, and extensive cleaning of exam room while observing appropriate contact time as indicated for  disinfecting solutions.

## 2021-01-15 DIAGNOSIS — F419 Anxiety disorder, unspecified: Secondary | ICD-10-CM | POA: Insufficient documentation

## 2021-01-15 DIAGNOSIS — Z09 Encounter for follow-up examination after completed treatment for conditions other than malignant neoplasm: Secondary | ICD-10-CM | POA: Insufficient documentation

## 2021-01-15 DIAGNOSIS — Z0000 Encounter for general adult medical examination without abnormal findings: Secondary | ICD-10-CM | POA: Insufficient documentation

## 2021-01-15 DIAGNOSIS — F32A Depression, unspecified: Secondary | ICD-10-CM | POA: Insufficient documentation

## 2021-01-15 NOTE — Assessment & Plan Note (Signed)
Anxiety> depression PHQ-9: 15 She was in "toxic" relationship for 3 years, that ended in December, since then she has been anxious, depressed.  Strongly denies any suicidal ideas. Extensive listening therapy provided. We discussed treatment options, recommend fluoxetine or Cymbalta or BuSpar. At the end we agreed on Cymbalta 30 mg 1 tablet daily. Suicidality discussed. Information about counselors provided.  Cost likely to be an issue. Encouraged routine exercise. RTC 4 weeks

## 2021-01-18 ENCOUNTER — Telehealth: Payer: Self-pay | Admitting: Internal Medicine

## 2021-01-18 NOTE — Telephone Encounter (Signed)
Options: -Okay to simply stop -She could take to take it at night and see how that works -She could switch to fluoxetine 10 mg capsules: 1 capsule daily for 10 days, then 2 capsule daily #60 no refills.

## 2021-01-18 NOTE — Telephone Encounter (Signed)
Please advise 

## 2021-01-18 NOTE — Telephone Encounter (Signed)
Patient states prescribed DULoxetine (CYMBALTA) 30 MG capsule [[id number]]  On Friday 01/13/2021, Patient states she is not  tolerating the medication well. Patient states she is sleeping during the day at work. Please Adsive if medication can be discontinued.

## 2021-01-19 NOTE — Telephone Encounter (Signed)
LMOM informing Pt of PCP recommendations. Informed Pt to call if interested in switching to another medication.

## 2021-01-24 ENCOUNTER — Telehealth: Payer: Self-pay

## 2021-01-24 NOTE — Telephone Encounter (Signed)
Spoke with patient and informed her. Staff message sent to Butch Penny to call her to schedule.

## 2021-01-24 NOTE — Telephone Encounter (Signed)
Mood changes such as anxiety and depression can be a symptom of perimenopause. She can schedule an office visit if she would like to discuss management for this and/or do some lab work. Also let her know that Cymbalta and other medications for this do take 4-6 weeks to work. She may need to try a different medication if she cannot tolerate the side effects of that one.

## 2021-01-24 NOTE — Telephone Encounter (Signed)
Patient reports x a month and a half she is experiencing symptoms of anxiety and depression. Her PCP, Dr. Larose Kells, prescribed Cymbalta for her. She said she only took it for a week because it made her so sleepy.  She is still having regular periods but questions could her hormones be "causing this imbalance in my brain?".  She asked about having "hormone levels" checked to determine this.

## 2021-01-26 ENCOUNTER — Ambulatory Visit (INDEPENDENT_AMBULATORY_CARE_PROVIDER_SITE_OTHER): Payer: BC Managed Care – PPO | Admitting: Nurse Practitioner

## 2021-01-26 ENCOUNTER — Other Ambulatory Visit: Payer: Self-pay

## 2021-01-26 ENCOUNTER — Encounter: Payer: Self-pay | Admitting: Nurse Practitioner

## 2021-01-26 VITALS — BP 108/72 | HR 82 | Resp 20

## 2021-01-26 DIAGNOSIS — N951 Menopausal and female climacteric states: Secondary | ICD-10-CM

## 2021-01-26 DIAGNOSIS — F418 Other specified anxiety disorders: Secondary | ICD-10-CM | POA: Diagnosis not present

## 2021-01-26 MED ORDER — SERTRALINE HCL 25 MG PO TABS: 25.0000 mg | ORAL_TABLET | Freq: Every day | ORAL | 5 refills | Status: DC

## 2021-01-26 NOTE — Progress Notes (Signed)
   Acute Office Visit  Subjective:    Patient ID: Savannah Cook, female    DOB: 1966-09-21, 55 y.o.   MRN: [id number]   HPI 55 y.o. presents today for anxiety and depression. She feels she has been struggling daily with anxiety and does not have the desire to be active or do things she normally enjoys. She had a recent break up and feels she is alone. She does have a supportive daughter who lives in [place]. Her PCP prescribed Cymbalta and she stopped taking after 1 week due to drowsiness. She has monthly cycles but missed one in November 2021. She denies any menopausal symptoms. She has been looking into counseling/support groups in the area. She is taking herbal supplements for mood support. She is requesting hormone testing today.    Review of Systems  Constitutional: Positive for activity change, appetite change and fatigue.  Genitourinary: Negative.   Neurological: Positive for headaches.  Psychiatric/Behavioral: Positive for dysphoric mood. Negative for self-injury and suicidal ideas. The patient is nervous/anxious.        Objective:    Physical Exam Constitutional:      Appearance: Normal appearance.  Psychiatric:        Attention and Perception: Attention normal.        Mood and Affect: Mood is anxious and depressed.        Speech: Speech normal.        Behavior: Behavior normal.     BP 108/72 (BP Location: Right Arm, Patient Position: Sitting)   Pulse 82   Resp 20   LMP 01/13/2021 (Exact Date)  Wt Readings from Last 3 Encounters:  01/13/21 184 lb (83.5 kg)  07/25/20 205 lb (93 kg)  04/14/20 202 lb 4 oz (91.7 kg)        Assessment & Plan:   Problem List Items Addressed This Visit   None   Visit Diagnoses    Perimenopause    -  Primary   Anxiety with depression       Relevant Medications   sertraline (ZOLOFT) 25 MG tablet     Plan: We discussed symptoms are very likely related to perimenopause and discussed the management options available. Informed her  that checking hormone levels during perimenopause is not accurate and would not change treatment plan. We agreed to try Zoloft 25 mg daily and she is aware side effects should subside after a couple of weeks and it will take 4-6 weeks for full effectiveness. She will take at night due to fear of drowsiness. I also agree with the counseling/support groups and self-care activities. She will let me know how she is doing with the Zoloft and we will adjust as appropriate. She is agreeable to plan.      Tamela Gammon [facility], 3:30 PM 01/26/2021

## 2021-02-06 DIAGNOSIS — F411 Generalized anxiety disorder: Secondary | ICD-10-CM | POA: Diagnosis not present

## 2021-02-06 DIAGNOSIS — F329 Major depressive disorder, single episode, unspecified: Secondary | ICD-10-CM | POA: Diagnosis not present

## 2021-02-07 ENCOUNTER — Encounter: Payer: Self-pay | Admitting: Internal Medicine

## 2021-02-08 ENCOUNTER — Telehealth: Payer: Self-pay | Admitting: *Deleted

## 2021-02-08 ENCOUNTER — Other Ambulatory Visit: Payer: Self-pay | Admitting: Nurse Practitioner

## 2021-02-08 DIAGNOSIS — F418 Other specified anxiety disorders: Secondary | ICD-10-CM

## 2021-02-08 MED ORDER — ESCITALOPRAM OXALATE 5 MG PO TABS: 5.0000 mg | ORAL_TABLET | Freq: Every day | ORAL | 5 refills | Status: DC

## 2021-02-08 NOTE — Telephone Encounter (Signed)
Spoke with patient. Thank you

## 2021-02-08 NOTE — Telephone Encounter (Signed)
Patient called asking if you could call her personally at her cell number first between 1:30pm-2:00pm this is her lunch break. Patient said if you can't get her at cell number you may call the work number listed in chart. Patient would like to discuss the Zoloft prescribed recently. I did explain you are seeing patients and I couldn't promise her you would available at this time frame. Patient then say you may call her whenever it is a good time for you.

## 2021-02-08 NOTE — Telephone Encounter (Signed)
Called patient per request to discuss Zoloft as she is feeling tired and has shakiness with use. She has been on it for almost 2 weeks. We again discussed that these are normal side effects and typically subside after 3-4 weeks. We also discussed the option to switch medication or try hormone therapy since this does seem to be related to perimenopause. She would like to try Lexapro because she has a close friend who has done well on it. I am agreeable and we will switch to Lexapro 5 mg. She is aware she does not need to wean and can take at next scheduled time. Recommend taking at night if she continues to have drowsiness. She is also aware it can take 4-6 weeks for improvement in mood. She will keep me updated and will consider hormone therapy if this is not effective. She is also looking into therapy/support groups and has an appointment next month. All questions answered.

## 2021-02-13 ENCOUNTER — Encounter: Payer: Self-pay | Admitting: Nurse Practitioner

## 2021-02-15 ENCOUNTER — Ambulatory Visit: Payer: BC Managed Care – PPO | Admitting: Internal Medicine

## 2021-02-20 ENCOUNTER — Encounter: Payer: Self-pay | Admitting: Nurse Practitioner

## 2021-02-24 ENCOUNTER — Encounter: Payer: Self-pay | Admitting: Internal Medicine

## 2021-02-24 ENCOUNTER — Telehealth: Payer: Self-pay | Admitting: Internal Medicine

## 2021-02-24 NOTE — Telephone Encounter (Signed)
Advise patient: Needs office visit for further evaluation. I can not guarantee I'll  order any labs she request, will do if clinically appropriate.

## 2021-02-24 NOTE — Telephone Encounter (Signed)
Pt is also requesting   hormone levels, estrogen, testosterone and cortisol and thyroid.

## 2021-02-24 NOTE — Telephone Encounter (Signed)
Patient insist she would like to speak to either nurse assistant or Dr. Larose Kells. Patient is requesting the same blood work she had done back in 04/11/20. I offer an appointment since, Dr.Paz request o see her back. Patient refused

## 2021-02-24 NOTE — Telephone Encounter (Signed)
Mychart message sent to Pt.

## 2021-03-01 DIAGNOSIS — F4323 Adjustment disorder with mixed anxiety and depressed mood: Secondary | ICD-10-CM | POA: Diagnosis not present

## 2021-03-10 ENCOUNTER — Encounter: Payer: Self-pay | Admitting: Nurse Practitioner

## 2021-03-10 ENCOUNTER — Encounter: Payer: Self-pay | Admitting: Internal Medicine

## 2021-03-23 ENCOUNTER — Encounter: Payer: Self-pay | Admitting: Nurse Practitioner

## 2021-03-23 ENCOUNTER — Other Ambulatory Visit: Payer: Self-pay

## 2021-03-23 ENCOUNTER — Ambulatory Visit: Payer: BC Managed Care – PPO | Admitting: Nurse Practitioner

## 2021-03-23 VITALS — BP 120/74 | Ht 62.0 in | Wt 177.0 lb

## 2021-03-23 DIAGNOSIS — Z01419 Encounter for gynecological examination (general) (routine) without abnormal findings: Secondary | ICD-10-CM

## 2021-03-23 DIAGNOSIS — Z8349 Family history of other endocrine, nutritional and metabolic diseases: Secondary | ICD-10-CM | POA: Diagnosis not present

## 2021-03-23 DIAGNOSIS — R5383 Other fatigue: Secondary | ICD-10-CM | POA: Diagnosis not present

## 2021-03-23 DIAGNOSIS — L603 Nail dystrophy: Secondary | ICD-10-CM | POA: Diagnosis not present

## 2021-03-23 NOTE — Progress Notes (Signed)
   Savannah Cook July 24, 1966 [id number]   History:  55 y.o. G1P1 presents for annual exam. No GYN complaints. Normal pap history. Normal mammogram history - 08/2020 normal after ultrasound of right breast, calcifications resolved. Has been struggling with anxiety for 3-4 months. Has tried Cymbalta, Zoloft, and Lexapro but could not tolerate side effects. She is doing better since weather has changed, she has joined a gym, and is thinking about getting a new job. Her nails have become brittle and do not grow. She also complains of fatigue and not having the energy she used to. History of colon cancer.   Gynecologic History Patient's last menstrual period was 03/13/2021. Period Cycle (Days): 28 Period Duration (Days): 4 Period Pattern: Regular Menstrual Flow: Light,Moderate Dysmenorrhea: None Contraception/Family planning: abstinence  Health Maintenance Last Pap: 08/16/2019. Results were: normal Last mammogram/US: 08/2020. Results were: Normal after right breast ultrasound - calcifications resolved. Last colonoscopy: 2019, 3 year recall recommended Last Dexa: N/A  Past medical history, past surgical history, family history and social history were all reviewed and documented in the EPIC chart.  ROS:  A ROS was performed and pertinent positives and negatives are included.  Exam:  Vitals:   03/23/21 0911  BP: 120/74  Weight: 177 lb (80.3 kg)  Height: 5\' 2"  (1.575 m)   Body mass index is 32.37 kg/m.  General appearance:  Normal Thyroid:  Symmetrical, normal in size, without palpable masses or nodularity. Respiratory  Auscultation:  Clear without wheezing or rhonchi Cardiovascular  Auscultation:  Regular rate, without rubs, murmurs or gallops  Edema/varicosities:  Not grossly evident Abdominal  Soft,nontender, without masses, guarding or rebound.  Liver/spleen:  No organomegaly noted  Hernia:  None appreciated  Skin  Inspection:  Grossly normal   Breasts: Examined lying and  sitting.   Right: Without masses, retractions, discharge or axillary adenopathy.   Left: Without masses, retractions, discharge or axillary adenopathy. Gentitourinary   Inguinal/mons:  Normal without inguinal adenopathy  External genitalia:  Normal  BUS/Urethra/Skene's glands:  Normal  Vagina:  Normal  Cervix:  Normal  Uterus:  Normal in size, shape and contour.  Midline and mobile  Adnexa/parametria:     Rt: Without masses or tenderness.   Lt: Without masses or tenderness.  Anus and perineum: Normal  Digital rectal exam: Normal sphincter tone without palpated masses or tenderness  Assessment/Plan:  55 y.o. G1P1 for annual exam.   Well female exam with routine gynecological exam - Plan: CBC with Differential/Platelet, Comprehensive metabolic panel. Education provided on SBEs, importance of preventative screenings, current guidelines, high calcium diet, regular exercise, and multivitamin daily.   Brittle nails - Plan: TSH  Fatigue, unspecified type - Plan: Vitamin B12, TSH  Family history of vitamin D deficiency - Plan: VITAMIN D 25 Hydroxy (Vit-D Deficiency, Fractures)  Screening for cervical cancer - Normal Pap history.  Will repeat at 3-year interval per guidelines.  Screening for breast cancer - Normal mammogram history.  Continue annual screenings.  Normal breast exam today.  Screening for colon cancer - 2019 colonoscopy. History of colon cancer. Will repeat this year per GI's recommendation.  Return in 1 year for annual.     Tamela Gammon DNP, 9:42 AM 03/23/2021

## 2021-03-23 NOTE — Patient Instructions (Signed)
Health Maintenance, Female Adopting a healthy lifestyle and getting preventive care are important in promoting health and wellness. Ask your health care provider about:  The right schedule for you to have regular tests and exams.  Things you can do on your own to prevent diseases and keep yourself healthy. What should I know about diet, weight, and exercise? Eat a healthy diet  Eat a diet that includes plenty of vegetables, fruits, low-fat dairy products, and lean protein.  Do not eat a lot of foods that are high in solid fats, added sugars, or sodium.   Maintain a healthy weight Body mass index (BMI) is used to identify weight problems. It estimates body fat based on height and weight. Your health care provider can help determine your BMI and help you achieve or maintain a healthy weight. Get regular exercise Get regular exercise. This is one of the most important things you can do for your health. Most adults should:  Exercise for at least 150 minutes each week. The exercise should increase your heart rate and make you sweat (moderate-intensity exercise).  Do strengthening exercises at least twice a week. This is in addition to the moderate-intensity exercise.  Spend less time sitting. Even light physical activity can be beneficial. Watch cholesterol and blood lipids Have your blood tested for lipids and cholesterol at 57 years of age, then have this test every 5 years. Have your cholesterol levels checked more often if:  Your lipid or cholesterol levels are high.  You are older than 57 years of age.  You are at high risk for heart disease. What should I know about cancer screening? Depending on your health history and family history, you may need to have cancer screening at various ages. This may include screening for:  Breast cancer.  Cervical cancer.  Colorectal cancer.  Skin cancer.  Lung cancer. What should I know about heart disease, diabetes, and high blood  pressure? Blood pressure and heart disease  High blood pressure causes heart disease and increases the risk of stroke. This is more likely to develop in people who have high blood pressure readings, are of African descent, or are overweight.  Have your blood pressure checked: ? Every 3-5 years if you are 18-39 years of age. ? Every year if you are 40 years old or older. Diabetes Have regular diabetes screenings. This checks your fasting blood sugar level. Have the screening done:  Once every three years after age 40 if you are at a normal weight and have a low risk for diabetes.  More often and at a younger age if you are overweight or have a high risk for diabetes. What should I know about preventing infection? Hepatitis B If you have a higher risk for hepatitis B, you should be screened for this virus. Talk with your health care provider to find out if you are at risk for hepatitis B infection. Hepatitis C Testing is recommended for:  Everyone born from 1945 through 1965.  Anyone with known risk factors for hepatitis C. Sexually transmitted infections (STIs)  Get screened for STIs, including gonorrhea and chlamydia, if: ? You are sexually active and are younger than 57 years of age. ? You are older than 57 years of age and your health care provider tells you that you are at risk for this type of infection. ? Your sexual activity has changed since you were last screened, and you are at increased risk for chlamydia or gonorrhea. Ask your health care provider   if you are at risk.  Ask your health care provider about whether you are at high risk for HIV. Your health care provider may recommend a prescription medicine to help prevent HIV infection. If you choose to take medicine to prevent HIV, you should first get tested for HIV. You should then be tested every 3 months for as long as you are taking the medicine. Pregnancy  If you are about to stop having your period (premenopausal) and  you may become pregnant, seek counseling before you get pregnant.  Take 400 to 800 micrograms (mcg) of folic acid every day if you become pregnant.  Ask for birth control (contraception) if you want to prevent pregnancy. Osteoporosis and menopause Osteoporosis is a disease in which the bones lose minerals and strength with aging. This can result in bone fractures. If you are 65 years old or older, or if you are at risk for osteoporosis and fractures, ask your health care provider if you should:  Be screened for bone loss.  Take a calcium or vitamin D supplement to lower your risk of fractures.  Be given hormone replacement therapy (HRT) to treat symptoms of menopause. Follow these instructions at home: Lifestyle  Do not use any products that contain nicotine or tobacco, such as cigarettes, e-cigarettes, and chewing tobacco. If you need help quitting, ask your health care provider.  Do not use street drugs.  Do not share needles.  Ask your health care provider for help if you need support or information about quitting drugs. Alcohol use  Do not drink alcohol if: ? Your health care provider tells you not to drink. ? You are pregnant, may be pregnant, or are planning to become pregnant.  If you drink alcohol: ? Limit how much you use to 0-1 drink a day. ? Limit intake if you are breastfeeding.  Be aware of how much alcohol is in your drink. In the [place] one drink equals one 12 oz bottle of beer (355 mL), one 5 oz glass of wine (148 mL), or one 1 oz glass of hard liquor (44 mL). General instructions  Schedule regular health, dental, and eye exams.  Stay current with your vaccines.  Tell your health care provider if: ? You often feel depressed. ? You have ever been abused or do not feel safe at home. Summary  Adopting a healthy lifestyle and getting preventive care are important in promoting health and wellness.  Follow your health care provider's instructions about healthy  diet, exercising, and getting tested or screened for diseases.  Follow your health care provider's instructions on monitoring your cholesterol and blood pressure. This information is not intended to replace advice given to you by your health care provider. Make sure you discuss any questions you have with your health care provider. Document Revised: 11/26/2018 Document Reviewed: 11/26/2018 Elsevier Patient Education  2021 [employer]vier Inc.  

## 2021-03-24 LAB — COMPREHENSIVE METABOLIC PANEL
AG Ratio: 1.4 (calc) (ref 1.0–2.5)
ALT: 18 U/L (ref 6–29)
AST: 18 U/L (ref 10–35)
Albumin: 4.4 g/dL (ref 3.6–5.1)
Alkaline phosphatase (APISO): 73 U/L (ref 37–153)
BUN: 13 mg/dL (ref 7–25)
CO2: 24 mmol/L (ref 20–32)
Calcium: 9.6 mg/dL (ref 8.6–10.4)
Chloride: 103 mmol/L (ref 98–110)
Creat: 0.79 mg/dL (ref 0.50–1.05)
Globulin: 3.1 g/dL (calc) (ref 1.9–3.7)
Glucose, Bld: 109 mg/dL — ABNORMAL HIGH (ref 65–99)
Potassium: 4.2 mmol/L (ref 3.5–5.3)
Sodium: 138 mmol/L (ref 135–146)
Total Bilirubin: 0.5 mg/dL (ref 0.2–1.2)
Total Protein: 7.5 g/dL (ref 6.1–8.1)

## 2021-03-24 LAB — VITAMIN D 25 HYDROXY (VIT D DEFICIENCY, FRACTURES): Vit D, 25-Hydroxy: 58 ng/mL (ref 30–100)

## 2021-03-24 LAB — CBC WITH DIFFERENTIAL/PLATELET
Absolute Monocytes: 490 {cells}/uL (ref 200–950)
Basophils Absolute: 49 {cells}/uL (ref 0–200)
Basophils Relative: 0.7 %
Eosinophils Absolute: 147 {cells}/uL (ref 15–500)
Eosinophils Relative: 2.1 %
HCT: 44.4 % (ref 35.0–45.0)
Hemoglobin: 14.5 g/dL (ref 11.7–15.5)
Lymphs Abs: 1708 {cells}/uL (ref 850–3900)
MCH: 30 pg (ref 27.0–33.0)
MCHC: 32.7 g/dL (ref 32.0–36.0)
MCV: 91.7 fL (ref 80.0–100.0)
MPV: 10.6 fL (ref 7.5–12.5)
Monocytes Relative: 7 %
Neutro Abs: 4606 {cells}/uL (ref 1500–7800)
Neutrophils Relative %: 65.8 %
Platelets: 329 Thousand/uL (ref 140–400)
RBC: 4.84 Million/uL (ref 3.80–5.10)
RDW: 12.5 % (ref 11.0–15.0)
Total Lymphocyte: 24.4 %
WBC: 7 Thousand/uL (ref 3.8–10.8)

## 2021-03-24 LAB — TSH: TSH: 2.02 mIU/L (ref 0.40–4.50)

## 2021-03-24 LAB — VITAMIN B12: Vitamin B-12: 390 pg/mL (ref 200–1100)

## 2021-03-27 ENCOUNTER — Encounter: Payer: Self-pay | Admitting: Nurse Practitioner

## 2021-03-30 ENCOUNTER — Encounter: Payer: BC Managed Care – PPO | Admitting: Nurse Practitioner

## 2021-07-26 ENCOUNTER — Encounter: Payer: BC Managed Care – PPO | Admitting: Nurse Practitioner

## 2021-07-26 ENCOUNTER — Encounter: Payer: Self-pay | Admitting: Internal Medicine

## 2021-08-16 ENCOUNTER — Encounter: Payer: Self-pay | Admitting: Nurse Practitioner

## 2021-08-16 DIAGNOSIS — Z1231 Encounter for screening mammogram for malignant neoplasm of breast: Secondary | ICD-10-CM | POA: Diagnosis not present

## 2021-09-21 ENCOUNTER — Other Ambulatory Visit: Payer: Self-pay

## 2021-09-21 ENCOUNTER — Encounter: Payer: Self-pay | Admitting: Internal Medicine

## 2021-09-21 ENCOUNTER — Ambulatory Visit (AMBULATORY_SURGERY_CENTER): Payer: BC Managed Care – PPO | Admitting: *Deleted

## 2021-09-21 VITALS — Ht 62.0 in | Wt 190.0 lb

## 2021-09-21 DIAGNOSIS — Z85038 Personal history of other malignant neoplasm of large intestine: Secondary | ICD-10-CM

## 2021-09-21 NOTE — Progress Notes (Signed)
Pt's previsit is done over the phone and all paperwork (prep instructions, blank consent form to just read over) sent to patient.  Pt's name and DOB verified at the beginning of the previsit.  Pt denies any difficulty with ambulating.    No trouble with anesthesia, denies being told they were difficult to intubate, or hx/fam hx of malignant hyperthermia per pt  Instructions sent via MyChart and mailed    No egg or soy allergy  No home oxygen use   No medications for weight loss taken  Pt denies constipation issues

## 2021-10-05 ENCOUNTER — Ambulatory Visit (AMBULATORY_SURGERY_CENTER): Payer: BC Managed Care – PPO | Admitting: Internal Medicine

## 2021-10-05 ENCOUNTER — Encounter: Payer: Self-pay | Admitting: Internal Medicine

## 2021-10-05 VITALS — BP 105/70 | HR 64 | Temp 98.6°F | Resp 13 | Ht 62.0 in | Wt 190.0 lb

## 2021-10-05 DIAGNOSIS — K635 Polyp of colon: Secondary | ICD-10-CM

## 2021-10-05 DIAGNOSIS — Z85038 Personal history of other malignant neoplasm of large intestine: Secondary | ICD-10-CM | POA: Diagnosis not present

## 2021-10-05 DIAGNOSIS — D124 Benign neoplasm of descending colon: Secondary | ICD-10-CM

## 2021-10-05 DIAGNOSIS — Z1211 Encounter for screening for malignant neoplasm of colon: Secondary | ICD-10-CM | POA: Diagnosis not present

## 2021-10-05 MED ORDER — SODIUM CHLORIDE 0.9 % IV SOLN: 500.0000 mL | Freq: Once | INTRAVENOUS | Status: DC

## 2021-10-05 NOTE — Op Note (Signed)
[name] Patient Name: Savannah Cook Procedure Date: 10/05/2021 11:37 AM MRN: [id number] Endoscopist: Gatha Mayer , MD Age: 55 Referring MD:  Date of Birth: 11-25-1966 Gender: Female Account #: [id number] Procedure:                Colonoscopy Indications:              High risk colon cancer surveillance: Personal                            history of colon cancer, Last colonoscopy: 2019 Medicines:                Propofol per Anesthesia, Monitored Anesthesia Care Procedure:                Pre-Anesthesia Assessment:                           - Prior to the procedure, a History and Physical                            was performed, and patient medications and                            allergies were reviewed. The patient's tolerance of                            previous anesthesia was also reviewed. The risks                            and benefits of the procedure and the sedation                            options and risks were discussed with the patient.                            All questions were answered, and informed consent                            was obtained. Prior Anticoagulants: The patient has                            taken no previous anticoagulant or antiplatelet                            agents. ASA Grade Assessment: II - A patient with                            mild systemic disease. After reviewing the risks                            and benefits, the patient was deemed in                            satisfactory condition to undergo the procedure.  After obtaining informed consent, the colonoscope                            was passed under direct vision. Throughout the                            procedure, the patient's blood pressure, pulse, and                            oxygen saturations were monitored continuously. The                            CF [id number] #[id number] was introduced through the anus                             and advanced to the the ileocolonic anastomosis.                            The colonoscopy was performed without difficulty.                            The patient tolerated the procedure well. The                            quality of the bowel preparation was good. The                            rectum and Ileocolonic anastomsis areas were                            photographed. Scope In: 11:54:05 AM Scope Out: 12:04:44 PM Scope Withdrawal Time: 0 hours 8 minutes 43 seconds  Total Procedure Duration: 0 hours 10 minutes 39 seconds  Findings:                 The perianal and digital rectal examinations were                            normal.                           A 2 mm polyp was found in the descending colon. The                            polyp was sessile. The polyp was removed with a hot                            snare. Resection and retrieval were complete.                            Verification of patient identification for the                            specimen was done. Estimated blood loss was minimal.  Scattered diverticula were found in the entire                            colon.                           The exam was otherwise without abnormality on                            direct and retroflexion views. Complications:            No immediate complications. Estimated Blood Loss:     Estimated blood loss was minimal. Impression:               - One 2 mm polyp in the descending colon, removed                            with a hot snare. Resected and retrieved.                           - Diverticulosis in the entire examined colon.                           - The examination was otherwise normal on direct                            and retroflexion views.                           - Personal history of malignant neoplasm of the                            colon. Recommendation:           - Patient has a contact number available for                             emergencies. The signs and symptoms of potential                            delayed complications were discussed with the                            patient. Return to normal activities tomorrow.                            Written discharge instructions were provided to the                            patient.                           - Resume previous diet.                           - Continue present medications.                           -  Await pathology results.                           - Repeat colonoscopy in 5 years for surveillance. Gatha Mayer, MD 10/05/2021 12:10:04 PM This report has been signed electronically.

## 2021-10-05 NOTE — Progress Notes (Signed)
[facility] History and Physical   Primary Care Physician:  Colon Branch, MD   Reason for Procedure:   Hx colon cancer  Plan:    colonoscopy     HPI: Savannah Cook is a 55 y.o. female w/ hx colon cancer dx and resected 2018. Here for surveillance colonoscopy.   Past Medical History:  Diagnosis Date   Anxiety    hx of, no medications now   Colon cancer ([place]) 06/10/2017   GERD (gastroesophageal reflux disease)    Hx of colonic polyp - sessile serrated 10/08/2018   Iron deficiency anemia due to chronic blood loss    colon cancer   Seasonal allergies     Past Surgical History:  Procedure Laterality Date   CHOLECYSTECTOMY N/A 07/25/2017   Procedure: CHOLECYSTECTOMY;  Surgeon: Jackolyn Confer, MD;  Location: [facility];  Service: General;  Laterality: N/A;   COLONOSCOPY  06/10/2018   w/Dr.Apryle Stowell    LAPAROSCOPIC PARTIAL COLECTOMY N/A 07/25/2017   Procedure: LAPAROSCOPIC PARTIAL COLECTOMY;  Surgeon: Jackolyn Confer, MD;  Location: [facility];  Service: General;  Laterality: N/A;   UPPER GASTROINTESTINAL ENDOSCOPY  06/10/2018   WISDOM TOOTH EXTRACTION     WRIST SURGERY Right 04/2012    Prior to Admission medications   Medication Sig Start Date End Date Taking? Authorizing Provider  Cholecalciferol (VITAMIN D3 PO) Take 1 tablet by mouth daily.   Yes [provider]  CINNAMON PO Take by mouth.   Yes [provider]  ELDERBERRY PO Take by mouth. Every other day  2000 mg   Yes [provider]  Multiple Vitamins-Minerals (ALIVE ONCE DAILY WOMENS PO) Take by mouth daily.   Yes [provider]  OVER THE COUNTER MEDICATION New Chapter Whole Food vitamins   Yes [provider]  OVER THE COUNTER MEDICATION daily. Hypothalamus Pmg   Yes [provider]  TURMERIC PO Take by mouth.   Yes [provider]  ASHWAGANDHA PO Take 470 mg by mouth daily. Patient not taking: No sig reported    [provider]   Cyanocobalamin (VITAMIN B-12 PO) Take 1 tablet by mouth daily.    [provider]  escitalopram (LEXAPRO) 5 MG tablet Take 1 tablet (5 mg total) by mouth daily. Patient not taking: Reported on 10/05/2021 02/08/21   Marny Lowenstein A, NP  Pyridoxine HCl (B-6 PO) Take by mouth daily.    [provider]    Current Outpatient Medications  Medication Sig Dispense Refill   Cholecalciferol (VITAMIN D3 PO) Take 1 tablet by mouth daily.     CINNAMON PO Take by mouth.     ELDERBERRY PO Take by mouth. Every other day  2000 mg     Multiple Vitamins-Minerals (ALIVE ONCE DAILY WOMENS PO) Take by mouth daily.     OVER THE COUNTER MEDICATION New Chapter Whole Food vitamins     OVER THE COUNTER MEDICATION daily. Hypothalamus Pmg     TURMERIC PO Take by mouth.     ASHWAGANDHA PO Take 470 mg by mouth daily. (Patient not taking: No sig reported)     Cyanocobalamin (VITAMIN B-12 PO) Take 1 tablet by mouth daily.     escitalopram (LEXAPRO) 5 MG tablet Take 1 tablet (5 mg total) by mouth daily. (Patient not taking: Reported on 10/05/2021) 30 tablet 5   Pyridoxine HCl (B-6 PO) Take by mouth daily.     Current Facility-Administered Medications  Medication Dose Route Frequency Provider Last Rate Last Admin   0.9 %  sodium chloride infusion  500 mL Intravenous Once Gatha Mayer, MD        Allergies as of 10/05/2021 - Review Complete 10/05/2021  Allergen Reaction Noted   Ceclor [cefaclor] Rash 04/24/2012    Family History  Problem Relation Age of Onset   Diabetes Mother    Hypertension Mother    Bipolar disorder Mother    COPD Mother    Diabetes Father    Hypertension Father    Lung cancer Father    Colon cancer Maternal Grandfather 95   Heart disease Maternal Grandmother    Colon polyps Neg Hx    Esophageal cancer Neg Hx    Rectal cancer Neg Hx    Stomach cancer Neg Hx     Social History   Socioeconomic History   Marital status: Significant Other    Spouse name: Not on  file   Number of children: 1   Years of education: Not on file   Highest education level: Not on file  Occupational History   Occupation: Publishing copy, Glass blower/designer apartments  Tobacco Use   Smoking status: Former    Types: Cigarettes    Quit date: 12/17/1985    Years since quitting: 35.8   Smokeless tobacco: Never  Vaping Use   Vaping Use: Never used  Substance and Sexual Activity   Alcohol use: Yes    Comment: Rare   Drug use: No   Sexual activity: Not Currently    Comment: INTERCOURSE AGE UNKOWN , SEXUAL PARTNERS LESS THAN 5  Other Topics Concern   Not on file  Social History Narrative   The patient is single, used to live w/ a partner until 11-2020   One daughter   is employed as a Publishing copy   2 caffeinated beverages a day      Review of Systems: + cough All other review of systems negative except as mentioned in the HPI.  Physical Exam: Vital signs BP (!) 110/57   Pulse 83   Temp 98.6 F (37 C) (Temporal)   Ht 5\' 2"  (1.575 m)   Wt 190 lb (86.2 kg)   SpO2 100%   BMI 34.75 kg/m   General:   Alert,  Well-developed, well-nourished, pleasant and cooperative in NAD Lungs:  Clear throughout to auscultation.   Heart:  Regular rate and rhythm; no murmurs, clicks, rubs,  or gallops. Abdomen:  Soft, nontender and nondistended. Normal bowel sounds.   Neuro/Psych:  Alert and cooperative. Normal mood and affect. A and O x 3   @Demarqus Jocson  Simonne Maffucci, MD, [facility] [phone] (pager) 10/05/2021 11:45 AM@

## 2021-10-05 NOTE — Patient Instructions (Addendum)
I found and removed one tiny polyp that certainly looks benign.  Your next routine colonoscopy should be in 5 years - 2027.  I appreciate the opportunity to care for you. Gatha Mayer, MD, FACG YOU HAD AN ENDOSCOPIC PROCEDURE TODAY AT [facility]:   Refer to the procedure report that was given to you for any specific questions about what was found during the examination.  If the procedure report does not answer your questions, please call your gastroenterologist to clarify.  If you requested that your care partner not be given the details of your procedure findings, then the procedure report has been included in a sealed envelope for you to review at your convenience later.  YOU SHOULD EXPECT: Some feelings of bloating in the abdomen. Passage of more gas than usual.  Walking can help get rid of the air that was put into your GI tract during the procedure and reduce the bloating. If you had a lower endoscopy (such as a colonoscopy or flexible sigmoidoscopy) you may notice spotting of blood in your stool or on the toilet paper. If you underwent a bowel prep for your procedure, you may not have a normal bowel movement for a few days.  Please Note:  You might notice some irritation and congestion in your nose or some drainage.  This is from the oxygen used during your procedure.  There is no need for concern and it should clear up in a day or so.  SYMPTOMS TO REPORT IMMEDIATELY:  Following lower endoscopy (colonoscopy or flexible sigmoidoscopy):  Excessive amounts of blood in the stool  Significant tenderness or worsening of abdominal pains  Swelling of the abdomen that is new, acute  Fever of 100F or higher   For urgent or emergent issues, a gastroenterologist can be reached at any hour by calling [phone]. Do not use MyChart messaging for urgent concerns.    DIET:  We do recommend a small meal at first, but then you may proceed to your regular diet.  Drink plenty of  fluids but you should avoid alcoholic beverages for 24 hours.  MEDICATIONS: Continue present medications.  Please see handouts given to you by your recovery nurse.  Thank you for allowing [place] to provide for your healthcare needs today.  ACTIVITY:  You should plan to take it easy for the rest of today and you should NOT DRIVE or use heavy machinery until tomorrow (because of the sedation medicines used during the test).    FOLLOW UP: Our staff will call the number listed on your records 48-72 hours following your procedure to check on you and address any questions or concerns that you may have regarding the information given to you following your procedure. If we do not reach you, we will leave a message.  We will attempt to reach you two times.  During this call, we will ask if you have developed any symptoms of COVID 19. If you develop any symptoms (ie: fever, flu-like symptoms, shortness of breath, cough etc.) before then, please call [phone].  If you test positive for Covid 19 in the 2 weeks post procedure, please call and report this information to [place].    If any biopsies were taken you will be contacted by phone or by letter within the next 1-3 weeks.  Please call us at [phone] if you have not heard about the biopsies in 3 weeks.    SIGNATURES/CONFIDENTIALITY: You and/or your care partner have signed paperwork which  will be entered into your electronic medical record.  These signatures attest to the fact that that the information above on your After Visit Summary has been reviewed and is understood.  Full responsibility of the confidentiality of this discharge information lies with you and/or your care-partner.

## 2021-10-05 NOTE — Progress Notes (Signed)
Called to room to assist during endoscopic procedure.  Patient ID and intended procedure confirmed with present staff. Received instructions for my participation in the procedure from the performing physician.  

## 2021-10-05 NOTE — Progress Notes (Signed)
VS completed by DT.  Pt's states no medical or surgical changes since previsit or office visit.  

## 2021-10-05 NOTE — Progress Notes (Signed)
To PACU, VSS. Report to [email or web address] 

## 2021-10-09 ENCOUNTER — Telehealth: Payer: Self-pay

## 2021-10-09 NOTE — Telephone Encounter (Signed)
  Follow up Call-  Call back number 10/05/2021  Post procedure Call Back phone  # [phone]  Permission to leave phone message Yes  Some recent data might be hidden     Patient questions:  Do you have a fever, pain , or abdominal swelling? No. Pain Score  0 *  Have you tolerated food without any problems? Yes.    Have you been able to return to your normal activities? Yes.    Do you have any questions about your discharge instructions: Diet   No. Medications  No. Follow up visit  No.  Do you have questions or concerns about your Care? No.  Actions: * If pain score is 4 or above: No action needed, pain <4.  Have you developed a fever since your procedure? no  2.   Have you had an respiratory symptoms (SOB or cough) since your procedure? no  3.   Have you tested positive for COVID 19 since your procedure no  4.   Have you had any family members/close contacts diagnosed with the COVID 19 since your procedure?  no   If yes to any of these questions please route to Joylene John, RN and Joella Prince, RN

## 2021-10-12 ENCOUNTER — Encounter: Payer: Self-pay | Admitting: Internal Medicine

## 2021-11-16 DIAGNOSIS — B029 Zoster without complications: Secondary | ICD-10-CM

## 2021-11-16 HISTORY — DX: Zoster without complications: B02.9

## 2021-12-14 DIAGNOSIS — Z23 Encounter for immunization: Secondary | ICD-10-CM | POA: Diagnosis not present

## 2021-12-14 DIAGNOSIS — B028 Zoster with other complications: Secondary | ICD-10-CM | POA: Diagnosis not present

## 2021-12-15 DIAGNOSIS — Z135 Encounter for screening for eye and ear disorders: Secondary | ICD-10-CM | POA: Diagnosis not present

## 2021-12-15 DIAGNOSIS — B029 Zoster without complications: Secondary | ICD-10-CM | POA: Diagnosis not present

## 2022-01-04 DIAGNOSIS — L719 Rosacea, unspecified: Secondary | ICD-10-CM | POA: Diagnosis not present

## 2022-01-04 DIAGNOSIS — J329 Chronic sinusitis, unspecified: Secondary | ICD-10-CM | POA: Diagnosis not present

## 2022-01-04 DIAGNOSIS — L739 Follicular disorder, unspecified: Secondary | ICD-10-CM | POA: Diagnosis not present

## 2022-01-04 DIAGNOSIS — R059 Cough, unspecified: Secondary | ICD-10-CM | POA: Diagnosis not present

## 2022-01-31 ENCOUNTER — Other Ambulatory Visit: Payer: Self-pay | Admitting: Internal Medicine

## 2022-01-31 ENCOUNTER — Ambulatory Visit (INDEPENDENT_AMBULATORY_CARE_PROVIDER_SITE_OTHER): Payer: BC Managed Care – PPO

## 2022-01-31 ENCOUNTER — Encounter: Payer: Self-pay | Admitting: *Deleted

## 2022-01-31 ENCOUNTER — Ambulatory Visit (HOSPITAL_BASED_OUTPATIENT_CLINIC_OR_DEPARTMENT_OTHER)
Admission: RE | Admit: 2022-01-31 | Discharge: 2022-01-31 | Disposition: A | Discharge status: Home / Self Care | Payer: BC Managed Care – PPO | Source: Ambulatory Visit | Attending: Internal Medicine | Admitting: Internal Medicine

## 2022-01-31 ENCOUNTER — Encounter: Payer: Self-pay | Admitting: Internal Medicine

## 2022-01-31 ENCOUNTER — Ambulatory Visit (INDEPENDENT_AMBULATORY_CARE_PROVIDER_SITE_OTHER): Payer: BC Managed Care – PPO | Admitting: Internal Medicine

## 2022-01-31 ENCOUNTER — Other Ambulatory Visit: Payer: Self-pay

## 2022-01-31 ENCOUNTER — Telehealth: Payer: Self-pay | Admitting: Cardiology

## 2022-01-31 VITALS — BP 126/70 | HR 70 | Temp 98.0°F | Resp 16 | Ht 62.0 in | Wt 202.5 lb

## 2022-01-31 DIAGNOSIS — R052 Subacute cough: Secondary | ICD-10-CM

## 2022-01-31 DIAGNOSIS — I4892 Unspecified atrial flutter: Secondary | ICD-10-CM | POA: Diagnosis not present

## 2022-01-31 DIAGNOSIS — F32A Depression, unspecified: Secondary | ICD-10-CM

## 2022-01-31 DIAGNOSIS — R002 Palpitations: Secondary | ICD-10-CM

## 2022-01-31 DIAGNOSIS — R059 Cough, unspecified: Secondary | ICD-10-CM | POA: Diagnosis not present

## 2022-01-31 DIAGNOSIS — L719 Rosacea, unspecified: Secondary | ICD-10-CM | POA: Insufficient documentation

## 2022-01-31 DIAGNOSIS — F419 Anxiety disorder, unspecified: Secondary | ICD-10-CM

## 2022-01-31 LAB — CBC WITH DIFFERENTIAL/PLATELET
Basophils Absolute: 0 10*3/uL (ref 0.0–0.1)
Basophils Relative: 0.6 % (ref 0.0–3.0)
Eosinophils Absolute: 0.2 10*3/uL (ref 0.0–0.7)
Eosinophils Relative: 2.8 % (ref 0.0–5.0)
HCT: 44 % (ref 36.0–46.0)
Hemoglobin: 14.7 g/dL (ref 12.0–15.0)
Lymphocytes Relative: 35.8 % (ref 12.0–46.0)
Lymphs Abs: 2.1 10*3/uL (ref 0.7–4.0)
MCHC: 33.4 g/dL (ref 30.0–36.0)
MCV: 90.4 fl (ref 78.0–100.0)
Monocytes Absolute: 0.5 10*3/uL (ref 0.1–1.0)
Monocytes Relative: 8.6 % (ref 3.0–12.0)
Neutro Abs: 3 10*3/uL (ref 1.4–7.7)
Neutrophils Relative %: 52.2 % (ref 43.0–77.0)
Platelets: 244 10*3/uL (ref 150.0–400.0)
RBC: 4.86 Mil/uL (ref 3.87–5.11)
RDW: 13.6 % (ref 11.5–15.5)
WBC: 5.8 10*3/uL (ref 4.0–10.5)

## 2022-01-31 LAB — COMPREHENSIVE METABOLIC PANEL
ALT: 28 U/L (ref 0–35)
AST: 22 U/L (ref 0–37)
Albumin: 4.6 g/dL (ref 3.5–5.2)
Alkaline Phosphatase: 76 U/L (ref 39–117)
BUN: 20 mg/dL (ref 6–23)
CO2: 30 mEq/L (ref 19–32)
Calcium: 9.7 mg/dL (ref 8.4–10.5)
Chloride: 104 mEq/L (ref 96–112)
Creatinine, Ser: 0.77 mg/dL (ref 0.40–1.20)
GFR: 85.62 mL/min (ref >60.00)
Glucose, Bld: 101 mg/dL — ABNORMAL HIGH (ref 70–99)
Potassium: 4.7 mEq/L (ref 3.5–5.1)
Sodium: 140 mEq/L (ref 135–145)
Total Bilirubin: 0.5 mg/dL (ref 0.2–1.2)
Total Protein: 8.6 g/dL — ABNORMAL HIGH (ref 6.0–8.3)

## 2022-01-31 LAB — LIPID PANEL
Cholesterol: 210 mg/dL — ABNORMAL HIGH (ref 0–200)
HDL: 57.8 mg/dL (ref >39.00)
LDL Cholesterol: 131 mg/dL — ABNORMAL HIGH (ref 0–99)
NonHDL: 152.59
Total CHOL/HDL Ratio: 4
Triglycerides: 107 mg/dL (ref 0.0–149.0)
VLDL: 21.4 mg/dL (ref 0.0–40.0)

## 2022-01-31 LAB — MAGNESIUM: Magnesium: 2.2 mg/dL (ref 1.5–2.5)

## 2022-01-31 LAB — TSH: TSH: 2.15 u[IU]/mL (ref 0.35–5.50)

## 2022-01-31 MED ORDER — DILTIAZEM HCL 30 MG PO TABS: 30.0000 mg | ORAL_TABLET | Freq: Three times a day (TID) | ORAL | 0 refills | Status: DC

## 2022-01-31 MED ORDER — AZITHROMYCIN 250 MG PO TABS: ORAL_TABLET | ORAL | 0 refills | Status: DC

## 2022-01-31 MED ORDER — APIXABAN 5 MG PO TABS: 5.0000 mg | ORAL_TABLET | Freq: Two times a day (BID) | ORAL | 1 refills | Status: DC

## 2022-01-31 MED ORDER — AZELASTINE HCL 0.1 % NA SOLN: 2.0000 | Freq: Two times a day (BID) | NASAL | 12 refills | Status: DC

## 2022-01-31 NOTE — Patient Instructions (Addendum)
You have developed atrial flutter.  Start Eliquis 5 mg twice daily, this is an anticoagulant.  Watch for any unusual bleeding, headache, stomach pain.  Start Cardizem 30 mg 1 tablet 3 times a day. This medication can lower your blood pressure, watch for severe fatigue, dizziness.  If that is the case to stop and let us know.  We are referring you to cardiology  If you have severe palpitations, chest pain, difficulty breathing, sweats, nausea vomiting: Go to the ER  For cough: Take Zithromax and antibiotic Use the nasal spray Get a chest x-ray downstairs  GO TO THE LAB : Get the blood work     [place] back for a checkup in 4 weeks   Atrial Flutter Atrial flutter is a type of abnormal heart rhythm (arrhythmia). The heart has an electrical system that tells it how to beat. In atrial flutter, the signals move rapidly in the top chambers of the heart (the atria). This makes your heart beat very fast. Atrial flutter can come and go, or it can be permanent. The goal of treatment is to prevent blood clots from forming, control your heart rate, or restore your heartbeat to a normal rhythm. If this condition is not treated, it can cause serious problems, such as a weakened heart muscle (cardiomyopathy) or a stroke. What are the causes? This condition is often caused by conditions that damage the heart's electrical system. These include: Heart conditions and heart surgery. These include heart attacks and open-heart surgery. Lung problems, such as COPD or a blood clot in the lung (pulmonary embolism, or PE). Poorly controlled high blood pressure (hypertension). Overactive thyroid (hyperthyroidism). Diabetes. In some cases, the cause of this condition is not known. What increases the risk? You are more likely to develop this condition if: You are an elderly adult. You are a man. You are overweight (obese). You have obstructive sleep  apnea. You have a family history of atrial flutter. You have diabetes. You drink a lot of alcohol, especially binge drinking. You use drugs, including cannabis. You smoke. What are the signs or symptoms? Symptoms of this condition include: A feeling that your heart is pounding or racing (palpitations). Shortness of breath. Chest pain. Feeling dizzy or light-headed. Fainting. Low blood pressure (hypotension). Fatigue. Tiring easily during exercise or activity. In some cases, there are no symptoms. How is this diagnosed? This condition may be diagnosed with: An electrocardiogram (ECG) to check electrical signals of the heart. An ambulatory cardiac monitor to record your heart's activity for a few days. An echocardiogram to create pictures of your heart. A transesophageal echocardiogram (TEE) to create even better pictures of your heart. A stress test to check your blood supply while you exercise. Imaging tests, such as a CT scan or chest X-ray. Blood tests. How is this treated? Treatment depends on underlying conditions and how you feel when you experience atrial flutter. This condition may be treated with: Medicines to prevent blood clots or to treat heart rate or heart rhythm problems. Electrical cardioversion to reset the heart's rhythm. Ablation to remove the heart tissue that sends abnormal signals. Left atrial appendage closure to seal the area where blood clots can form. In some cases, underlying conditions will be treated. Follow these instructions at home: Medicines Take over-the-counter and prescription medicines only as told by your health care provider. Do not take any new medicines without talking to your health care provider. If you are taking blood  thinners: Talk with your health care provider before you take any medicines that contain aspirin or NSAIDs, such as ibuprofen. These medicines increase your risk for dangerous bleeding. Take your medicine exactly as  told, at the same time every day. Avoid activities that could cause injury or bruising, and follow instructions about how to prevent falls. Wear a medical alert bracelet or carry a card that lists what medicines you take. Lifestyle Eat heart-healthy foods. Talk with a dietitian to make an eating plan that is right for you. Do not use any products that contain nicotine or tobacco, such as cigarettes, e-cigarettes, and chewing tobacco. If you need help quitting, ask your health care provider. Do not drink alcohol. Do not use drugs, including cannabis. Lose weight if you are overweight or obese. Exercise regularly as instructed by your health care provider. General instructions Do not use diet pills unless your health care provider approves. Diet pills may make heart problems worse. If you have obstructive sleep apnea, manage your condition as told by your health care provider. Keep all follow-up visits as told by your health care provider. This is important. Contact a health care provider if you: Notice a change in the rate, rhythm, or strength of your heartbeat. Are taking a blood thinner and you notice more bruising. Have a sudden change in weight. Tire more easily when you exercise or do heavy work. Get help right away if you have: Pain or pressure in your chest. Shortness of breath. Fainting. Increasing sweating with no known cause. Side effects of blood thinners, such as blood in your vomit, stool, or urine, or bleeding that cannot stop. Any symptoms of a stroke. "BE FAST" is an easy way to remember the main warning signs of a stroke: B - Balance. Signs are dizziness, sudden trouble walking, or loss of balance. E - Eyes. Signs are trouble seeing or a sudden change in vision. F - Face. Signs are sudden weakness or numbness of the face, or the face or eyelid drooping on one side. A - Arms. Signs are weakness or numbness in an arm. This happens suddenly and usually on one side of the  body. S - Speech. Signs are sudden trouble speaking, slurred speech, or trouble understanding what people say. T - Time. Time to call emergency services. Write down what time symptoms started. Other signs of a stroke, such as: A sudden, severe headache with no known cause. Nausea or vomiting. Seizure. These symptoms may represent a serious problem that is an emergency. Do not wait to see if the symptoms will go away. Get medical help right away. Call your local emergency services (911 in the [place] Do not drive yourself to the hospital. Summary Atrial flutter is an abnormal heart rhythm that can give you symptoms of palpitations, shortness of breath, or fatigue. Atrial flutter is often treated with medicines to keep your heart in a normal rhythm and to prevent a stroke. Get help right away if you cannot catch your breath, or have chest pain or pressure. Get help right away if you have signs or symptoms of a stroke. This information is not intended to replace advice given to you by your health care provider. Make sure you discuss any questions you have with your health care provider. Document Revised: 05/27/2019 Document Reviewed: 05/27/2019 Elsevier Patient Education  [place].

## 2022-01-31 NOTE — Telephone Encounter (Signed)
Received phone call from Dr. Larose Kells about this patient who has been having intermittent palpitations for couple months.  On initial evaluation in the clinic heart rate was in the 70s.  However she subsequently developed sensation of palpitations and they were looking into capture atrial flutter with a rate of 146 bpm seen on EKG.  Relatively asymptomatic and hemodynamically stable.  Recommendation will be low-dose beta-blocker (Toprol 25 mg daily versus diltiazem CD 180 mg daily), and potentially at least 1 month of Eliquis in case cardioversion is indicated.  Would recommend to be Zio patch monitor and referral to the atrial fibrillation clinic.  If this is indeed recurrent atrial flutter, may be candidate for ablation.   Glenetta Hew, MD

## 2022-01-31 NOTE — Progress Notes (Addendum)
Subjective:    Patient ID: Savannah Cook, female    DOB: May 04, 1966, 56 y.o.   MRN: [id number]  DOS:  01/31/2022 Type of visit - description: Acute, multiple symptoms  Developed a URI in November 2022, in the next few weeks developed laryngitis.  A lot of postnasal dripping. Since then she has a lingering cough, very small sputum production, was prescribed amoxicillin and Tessalon Perles however sxs are not much different.  Also, having palpitations on and off for few months.  Described as a feeling of fluttering in the chest, no associated nausea, vomiting, near syncope, shakiness. Sometimes associated with chest pressure. Both palpitations and  chest pressure are typically at rest.   Review of Systems Emotionally doing great Specifically denies fever chills. No GERD symptoms No recent wheezing. Still sounds sinus pain and congestion. Some postnasal dripping.   Past Medical History:  Diagnosis Date   Anxiety    hx of, no medications now   Colon cancer ([place]) 06/10/2017   GERD (gastroesophageal reflux disease)    Hx of colonic polyp - sessile serrated 10/08/2018   Iron deficiency anemia due to chronic blood loss    colon cancer   Seasonal allergies     Past Surgical History:  Procedure Laterality Date   CHOLECYSTECTOMY N/A 07/25/2017   Procedure: CHOLECYSTECTOMY;  Surgeon: Jackolyn Confer, MD;  Location: [facility];  Service: General;  Laterality: N/A;   COLONOSCOPY  06/10/2018   w/Dr.Gessner    LAPAROSCOPIC PARTIAL COLECTOMY N/A 07/25/2017   Procedure: LAPAROSCOPIC PARTIAL COLECTOMY;  Surgeon: Jackolyn Confer, MD;  Location: [facility];  Service: General;  Laterality: N/A;   UPPER GASTROINTESTINAL ENDOSCOPY  06/10/2018   WISDOM TOOTH EXTRACTION     WRIST SURGERY Right 04/2012    Current Outpatient Medications  Medication Instructions   ASHWAGANDHA PO 470 mg, Daily   Cholecalciferol (VITAMIN D3 PO) 1 tablet, Oral, Daily   CINNAMON PO Oral   Cyanocobalamin (VITAMIN B-12 PO)  1 tablet, Oral, Daily   ELDERBERRY PO Oral, Every other day  2000 mg   escitalopram (LEXAPRO) 5 mg, Oral, Daily   LYSINE PO Oral   Multiple Vitamins-Minerals (ALIVE ONCE DAILY WOMENS PO) Oral, Daily   OVER THE COUNTER MEDICATION New Chapter Whole Food vitamins    OVER THE COUNTER MEDICATION Daily   OVER THE COUNTER MEDICATION [place] C   Pyridoxine HCl (B-6 PO) Oral, Daily   TURMERIC PO Oral       Objective:   Physical Exam BP 126/70 (BP Location: Left Arm, Patient Position: Sitting, Cuff Size: Normal)    Pulse 70    Temp 98 F (36.7 C) (Oral)    Resp 16    Ht 5\' 2"  (1.575 m)    Wt 202 lb 8 oz (91.9 kg)    LMP 03/13/2021    SpO2 97%    BMI 37.04 kg/m  General:   Well developed, NAD, BMI noted. HEENT:  Normocephalic . Face symmetric, atraumatic. TM: Normal bilaterally.  Nose slightly congested. Lungs:  CTA B Normal respiratory effort, no intercostal retractions, no accessory muscle use. Heart: RRR,  no murmur.  Lower extremities: no pretibial edema bilaterally  Skin: Not pale. Not jaundice Neurologic:  alert & oriented X3.  Speech normal, gait appropriate for age and unassisted Psych--  Cognition and judgment appear intact.  Cooperative with normal attention span and concentration.  Behavior appropriate. No anxious or depressed appearing.      Assessment      Assessment Dx colon cancer,2018,  s/p surgery, no chemo;  GI Dr. Carlean Purl, colonoscopy 09-2018 GERD   Menopausal LMP 07-2021. Asthma Shingles December 2022, right face.  PLAN  A flutter. Patient developed palpitations, chest pressure on and off since October 2022, , she is a non-smoker, no FH CAD.   Upon arrival to the office, pulse was 70 and regular, as we were doing the EKG she developed symptoms (palpitations), EKG showing a flutter.  She was not on any visible distress.  Few minutes later, heart rate was rechecked and it was back to normal. Case discussed with cardiology DOD. Advised patient what A-  flutter means.  See AVS. PLAN -Low-dose Cardizem, watch for low blood pressure (.  Cardizem 30 mg 3 times daily) - Eliquis  until she sees cardiology, then long-term if that is what they recommend -Order a Zio patch - Labs : CMP, CBC, TSH, FLP, magnesium -ER if chest pain, severe palpitations. Appreciate cardiology input Cough: Going on since she had called back in November.  History of asthma, currently no wheezing.  Denies GERD symptoms, admits to some PND. Plan: Chest x-ray, Astelin for postnasal dripping.  Zithromax (atypical infection?).  Consider trial with albuterol as she has a history of asthma. Anxiety depression: See last visit more than a year ago, symptoms are largely resolved RTC 4 weeks    Time spent 50 minutes addressing the new onset of a flutter, advising patient, reaching out to cardiology. Also, additional time was spent interpreting the EKG.  This visit occurred during the SARS-CoV-2 public health emergency.  Safety protocols were in place, including screening questions prior to the visit, additional usage of staff PPE, and extensive cleaning of exam room while observing appropriate contact time as indicated for disinfecting solutions.

## 2022-01-31 NOTE — Telephone Encounter (Signed)
Noted, thank you

## 2022-01-31 NOTE — Progress Notes (Unsigned)
Enrolled for [employer] to mail a ZIO XT long term holter monitor to the patients address on file.  Letter with instructions mailed to patient. 

## 2022-02-01 NOTE — Assessment & Plan Note (Signed)
A flutter. Patient developed palpitations, chest pressure on and off since October 2022, , she is a non-smoker, no FH CAD.   Upon arrival to the office, pulse was 70 and regular, as we were doing the EKG she developed symptoms (palpitations), EKG showing a flutter.  She was not on any visible distress.  Few minutes later, heart rate was rechecked and it was back to normal. Case discussed with cardiology DOD. Advised patient what A- flutter means.  See AVS. PLAN -Low-dose Cardizem, watch for low blood pressure (.  Cardizem 30 mg 3 times daily) - Eliquis  until she sees cardiology, then long-term if that is what they recommend -Order a Zio patch - Labs : CMP, CBC, TSH, FLP, magnesium -ER if chest pain, severe palpitations. Appreciate cardiology input Cough: Going on since she had called back in November.  History of asthma, currently no wheezing.  Denies GERD symptoms, admits to some PND. Plan: Chest x-ray, Astelin for postnasal dripping.  Zithromax (atypical infection?).  Consider trial with albuterol as she has a history of asthma. Anxiety depression: See last visit more than a year ago, symptoms are largely resolved RTC 4 weeks

## 2022-02-05 ENCOUNTER — Ambulatory Visit (HOSPITAL_COMMUNITY)
Admission: RE | Admit: 2022-02-05 | Discharge: 2022-02-05 | Disposition: A | Discharge status: Home / Self Care | Payer: BC Managed Care – PPO | Source: Ambulatory Visit | Attending: Nurse Practitioner | Admitting: Nurse Practitioner

## 2022-02-05 ENCOUNTER — Other Ambulatory Visit: Payer: Self-pay

## 2022-02-05 ENCOUNTER — Ambulatory Visit (HOSPITAL_COMMUNITY): Payer: BC Managed Care – PPO | Admitting: Physician Assistant

## 2022-02-05 VITALS — BP 134/80 | HR 70 | Ht 62.0 in | Wt 203.6 lb

## 2022-02-05 DIAGNOSIS — F419 Anxiety disorder, unspecified: Secondary | ICD-10-CM | POA: Insufficient documentation

## 2022-02-05 DIAGNOSIS — D6869 Other thrombophilia: Secondary | ICD-10-CM | POA: Diagnosis not present

## 2022-02-05 DIAGNOSIS — F32A Depression, unspecified: Secondary | ICD-10-CM | POA: Diagnosis not present

## 2022-02-05 DIAGNOSIS — Z79899 Other long term (current) drug therapy: Secondary | ICD-10-CM | POA: Diagnosis not present

## 2022-02-05 DIAGNOSIS — I483 Typical atrial flutter: Secondary | ICD-10-CM | POA: Diagnosis not present

## 2022-02-05 DIAGNOSIS — I4892 Unspecified atrial flutter: Secondary | ICD-10-CM | POA: Diagnosis not present

## 2022-02-05 MED ORDER — APIXABAN 5 MG PO TABS: 5.0000 mg | ORAL_TABLET | Freq: Two times a day (BID) | ORAL | 1 refills | Status: DC

## 2022-02-05 MED ORDER — DILTIAZEM HCL 30 MG PO TABS: 30.0000 mg | ORAL_TABLET | Freq: Three times a day (TID) | ORAL | 1 refills | Status: DC

## 2022-02-05 NOTE — Progress Notes (Signed)
Primary Care Physician: Colon Branch, MD Referring Physician: Dr. Velva Harman Savannah Cook is a 56 y.o. female with a h/o anxiety/depression, BMI of 37.24, s/p colon CA 2018, that is in the clinic for noticing palpitations for some time but EKG documenting typical atrial flutter in PCP office recently and being referred to the afib clinic for evaluation. She did return to SR at that office visit and is in [place] today.   She brings in a sack of supplements  that she has taken or is taking currently. She has recently been started on 30 mg Cardizem tid and eliquis 5 mg bid of a CHA2DS2VASc  score of 1.   She was mailed a zio patch to put on last Friday, but was unclear with directions so this will be placed today.   Today, she denies symptoms of palpitations, chest pain, shortness of breath, orthopnea, PND, lower extremity edema, dizziness, presyncope, syncope, or neurologic sequela. The patient is tolerating medications without difficulties and is otherwise without complaint today.   Past Medical History:  Diagnosis Date   Anxiety    hx of, no medications now   Atrial flutter (HCC)    Colon cancer ([place]) 06/10/2017   GERD (gastroesophageal reflux disease)    Hx of colonic polyp - sessile serrated 10/08/2018   Iron deficiency anemia due to chronic blood loss    colon cancer   Seasonal allergies    Shingles 11/2021   Past Surgical History:  Procedure Laterality Date   CHOLECYSTECTOMY N/A 07/25/2017   Procedure: CHOLECYSTECTOMY;  Surgeon: Jackolyn Confer, MD;  Location: [facility];  Service: General;  Laterality: N/A;   COLONOSCOPY  06/10/2018   w/Dr.Gessner    LAPAROSCOPIC PARTIAL COLECTOMY N/A 07/25/2017   Procedure: LAPAROSCOPIC PARTIAL COLECTOMY;  Surgeon: Jackolyn Confer, MD;  Location: [facility];  Service: General;  Laterality: N/A;   UPPER GASTROINTESTINAL ENDOSCOPY  06/10/2018   WISDOM TOOTH EXTRACTION     WRIST SURGERY Right 04/2012    Current Outpatient Medications  Medication Sig  Dispense Refill   apixaban (ELIQUIS) 5 MG TABS tablet Take 1 tablet (5 mg total) by mouth 2 (two) times daily. 60 tablet 1   benzonatate (TESSALON) 100 MG capsule Take 100 mg by mouth 3 (three) times daily as needed.     Cholecalciferol (VITAMIN D3 PO) Take 2,000 mg by mouth daily.     CINNAMON PO Take 1,000 mg by mouth every other day.     clindamycin (CLEOCIN T) 1 % lotion Apply topically 2 (two) times daily.     Cyanocobalamin (VITAMIN B-12 PO) Take 1 tablet by mouth daily. Energy and probiotic     diltiazem (CARDIZEM) 30 MG tablet Take 1 tablet (30 mg total) by mouth 3 (three) times daily. 90 tablet 0   ELDERBERRY PO Take by mouth. Every other day  2000 mg     Emollient [facility] EX) Uses for Rosacea- as needed     fluticasone (FLONASE) 50 MCG/ACT nasal spray Place 2 sprays into both nostrils as needed for allergies or rhinitis.     LYSINE PO Take 1 tablet by mouth as needed.     MULTIPLE VITAMINS PO Take 1 tablet by mouth. 40 plus women's vitamin- New Chapter     Multiple Vitamins-Minerals (MULTIVITAMIN ADULTS PO) Take 1 tablet by mouth every morning. Focus Factor-nutrition for the Brain     NON FORMULARY Apply topically as needed. Ivermectin/metronidazole mix cream     OVER THE COUNTER MEDICATION Take 1  tablet by mouth every morning. Cataplex C     triamcinolone cream (KENALOG) 0.1 % Apply 1 application topically as needed.     TURMERIC PO Take 1 tablet by mouth every other day. New Chapter     azelastine (ASTELIN) 0.1 % nasal spray Place 2 sprays into both nostrils 2 (two) times daily. Use in each nostril as directed (Patient not taking: Reported on 02/05/2022) 30 mL 12   azithromycin (ZITHROMAX Z-PAK) 250 MG tablet 2 tabs a day the first day, then 1 tab a day x 4 days (Patient not taking: Reported on 02/05/2022) 6 tablet 0   No current facility-administered medications for this encounter.    Allergies  Allergen Reactions   Ceclor [Cefaclor] Rash    Social History   Socioeconomic  History   Marital status: Significant Other    Spouse name: Not on file   Number of children: 1   Years of education: Not on file   Highest education level: Not on file  Occupational History   Occupation: Publishing copy, Glass blower/designer apartments  Tobacco Use   Smoking status: Former    Types: Cigarettes    Quit date: 12/17/1985    Years since quitting: 36.1   Smokeless tobacco: Never  Vaping Use   Vaping Use: Never used  Substance and Sexual Activity   Alcohol use: Yes    Comment: Rare   Drug use: No   Sexual activity: Not Currently    Comment: INTERCOURSE AGE UNKOWN , SEXUAL PARTNERS LESS THAN 5  Other Topics Concern   Not on file  Social History Narrative   The patient is single, used to live w/ a partner until 11-2020   One daughter   is employed as a Publishing copy   2 caffeinated beverages a day   Social Determinants of Radio broadcast assistant Strain: Not on file  Food Insecurity: Not on file  Transportation Needs: Not on file  Physical Activity: Not on file  Stress: Not on file  Social Connections: Not on file  Intimate Partner Violence: Not on file    Family History  Problem Relation Age of Onset   Diabetes Mother    Hypertension Mother    Bipolar disorder Mother    COPD Mother    Diabetes Father    Hypertension Father    Lung cancer Father    Heart disease Maternal Grandmother    Colon cancer Maternal Grandfather 95   Colon polyps Neg Hx    Esophageal cancer Neg Hx    Rectal cancer Neg Hx    Stomach cancer Neg Hx     ROS- All systems are reviewed and negative except as per the HPI above  Physical Exam: Vitals:   02/05/22 1434  BP: 134/80  Pulse: 70  Weight: 92.4 kg  Height: 5\' 2"  (1.575 m)   Wt Readings from Last 3 Encounters:  02/05/22 92.4 kg  01/31/22 91.9 kg  10/05/21 86.2 kg    Labs: Lab Results  Component Value Date   NA 140 01/31/2022   K 4.7 01/31/2022   CL 104 01/31/2022   CO2 30 01/31/2022   GLUCOSE 101 (H) 01/31/2022    BUN 20 01/31/2022   CREATININE 0.77 01/31/2022   CALCIUM 9.7 01/31/2022   MG 2.2 01/31/2022   No results found for: INR Lab Results  Component Value Date   CHOL 210 (H) 01/31/2022   HDL 57.80 01/31/2022   LDLCALC 131 (H) 01/31/2022   TRIG 107.0 01/31/2022  GEN- The patient is well appearing, alert and oriented x 3 today.   Head- normocephalic, atraumatic Eyes-  Sclera clear, conjunctiva pink Ears- hearing intact Oropharynx- clear Neck- supple, no JVP Lymph- no cervical lymphadenopathy Lungs- Clear to ausculation bilaterally, normal work of breathing Heart- Regular rate and rhythm, no murmurs, rubs or gallops, PMI not laterally displaced GI- soft, NT, ND, + BS Extremities- no clubbing, cyanosis, or edema MS- no significant deformity or atrophy Skin- no rash or lesion Psych- euthymic mood, full affect Neuro- strength and sensation are intact  EKG-Sinus rhythm at 70 bpm, pr int 144 ms, qrs int 76 bpm, qrs 427 ms  Epic records reviewed    Assessment and Plan:  1. Paroxysmal atrial flutter documented x one ekg General education re afib and flutter  Triggers discussed Asked to stop all supplements especially those that promote energy for fear of triggering arrhythmia Zio patch placed x 2 weeks Continue cardizem 30 mg tid  Will need echo scheduled   2. CHA2DS2VASc score of 1 Continue  eliquis 5 mg bid  for now, but may not need anything long term   Bleeding precautions discussed   3. BMI 37.24 Regular exercise/weight loss recommended   I will be in touch  with pt when monitor results are known   Butch Penny C. Viviana Trimble, [facility] [address] [place], [place] [address] [phone]

## 2022-02-09 ENCOUNTER — Telehealth: Payer: Self-pay | Admitting: Internal Medicine

## 2022-02-09 NOTE — Telephone Encounter (Signed)
Pt would like Zyrtec prescribed as she fills it is cheaper that way than otc. She also wanted a call to discuss with [facility] and dr. Larose Kells about watch patch 1 for her sleep apnea. Please advise.

## 2022-02-11 NOTE — Telephone Encounter (Signed)
Okay to send Zyrtec 10 mg 1 p.o. daily as needed for allergies, 1 year supply. Other issues can be discussed at the next appointment

## 2022-02-12 MED ORDER — CETIRIZINE HCL 10 MG PO TABS: 10.0000 mg | ORAL_TABLET | Freq: Every day | ORAL | 3 refills | Status: DC | PRN

## 2022-02-12 NOTE — Telephone Encounter (Signed)
Rx sent 

## 2022-02-21 ENCOUNTER — Other Ambulatory Visit: Payer: Self-pay

## 2022-02-21 ENCOUNTER — Telehealth: Payer: Self-pay | Admitting: Internal Medicine

## 2022-02-21 ENCOUNTER — Ambulatory Visit (HOSPITAL_COMMUNITY)
Admission: RE | Admit: 2022-02-21 | Discharge: 2022-02-21 | Disposition: A | Discharge status: Home / Self Care | Payer: BC Managed Care – PPO | Source: Ambulatory Visit | Attending: Nurse Practitioner | Admitting: Nurse Practitioner

## 2022-02-21 DIAGNOSIS — I483 Typical atrial flutter: Secondary | ICD-10-CM

## 2022-02-21 LAB — ECHOCARDIOGRAM COMPLETE
AR max vel: 2.78 cm2
AV Peak grad: 5.9 mmHg
Ao pk vel: 1.21 m/s
Area-P 1/2: 3.63 cm2
Calc EF: 58.3 %
S' Lateral: 3 cm
Single Plane A2C EF: 61.7 %
Single Plane A4C EF: 57.4 %

## 2022-02-21 NOTE — Telephone Encounter (Signed)
Pt would like to know if Zio Rhythm has been received. Please advise.  ?

## 2022-02-22 NOTE — Telephone Encounter (Signed)
Calling to check if we had to [place] yet ?

## 2022-02-22 NOTE — Telephone Encounter (Signed)
Cardiology is checking on this- see below.  ? ?

## 2022-02-23 ENCOUNTER — Encounter (HOSPITAL_COMMUNITY): Payer: Self-pay | Admitting: *Deleted

## 2022-02-23 NOTE — Telephone Encounter (Signed)
Received Zio XT patch results. Results placed in red folder for review. Do I need to fax this to cardiology for interpretation.  ?

## 2022-02-23 NOTE — Telephone Encounter (Signed)
Receive Zio patch results. ?+ Atrial flutter.  See picture. ? ?We will fax full report to cardiology ? ? ? ?

## 2022-02-23 NOTE — Telephone Encounter (Signed)
Zio patch faxed to Afib clinic.  ?

## 2022-02-23 NOTE — Telephone Encounter (Signed)
Spoke w/ Pt- informed that cardiology has confirmed that the Zio patch has been received back and currently being processed. Pt verbalized understanding.  ?

## 2022-02-26 ENCOUNTER — Encounter: Payer: Self-pay | Admitting: Internal Medicine

## 2022-02-26 ENCOUNTER — Ambulatory Visit: Payer: BC Managed Care – PPO | Admitting: Internal Medicine

## 2022-02-26 ENCOUNTER — Ambulatory Visit (INDEPENDENT_AMBULATORY_CARE_PROVIDER_SITE_OTHER): Payer: BC Managed Care – PPO | Admitting: Internal Medicine

## 2022-02-26 VITALS — BP 128/86 | HR 98 | Temp 98.1°F | Resp 16 | Ht 62.0 in | Wt 199.5 lb

## 2022-02-26 DIAGNOSIS — E669 Obesity, unspecified: Secondary | ICD-10-CM | POA: Diagnosis not present

## 2022-02-26 DIAGNOSIS — I4892 Unspecified atrial flutter: Secondary | ICD-10-CM | POA: Diagnosis not present

## 2022-02-26 DIAGNOSIS — Z0189 Encounter for other specified special examinations: Secondary | ICD-10-CM | POA: Diagnosis not present

## 2022-02-26 MED ORDER — CETIRIZINE HCL 10 MG PO TABS: 10.0000 mg | ORAL_TABLET | Freq: Every day | ORAL | 3 refills | Status: DC | PRN

## 2022-02-26 NOTE — Progress Notes (Unsigned)
Subjective:    Patient ID: Savannah Cook, female    DOB: 1966-11-24, 56 y.o.   MRN: [id number]  DOS:  02/26/2022 Type of visit - description: f/u  Was seen 01/31/2023, found to be on atrial flutter. Subsequently saw cardiology, note reviewed. Also had a echo and a Zio patch, it did show a flutter and 4 beat SVT.  Since the last visit, she continue with palpitations on and off. No associated chest pain, difficulty breathing, diaphoresis, LOC. Denies any recent bleeding.  Review of Systems See above   Past Medical History:  Diagnosis Date   Anxiety    hx of, no medications now   Atrial flutter (HCC)    Colon cancer ([place]) 06/10/2017   GERD (gastroesophageal reflux disease)    Hx of colonic polyp - sessile serrated 10/08/2018   Iron deficiency anemia due to chronic blood loss    colon cancer   Seasonal allergies    Shingles 11/2021    Past Surgical History:  Procedure Laterality Date   CHOLECYSTECTOMY N/A 07/25/2017   Procedure: CHOLECYSTECTOMY;  Surgeon: Jackolyn Confer, MD;  Location: [facility];  Service: General;  Laterality: N/A;   COLONOSCOPY  06/10/2018   w/Dr.Gessner    LAPAROSCOPIC PARTIAL COLECTOMY N/A 07/25/2017   Procedure: LAPAROSCOPIC PARTIAL COLECTOMY;  Surgeon: Jackolyn Confer, MD;  Location: [facility];  Service: General;  Laterality: N/A;   UPPER GASTROINTESTINAL ENDOSCOPY  06/10/2018   WISDOM TOOTH EXTRACTION     WRIST SURGERY Right 04/2012    Current Outpatient Medications  Medication Instructions   apixaban (ELIQUIS) 5 mg, Oral, 2 times daily   azelastine (ASTELIN) 0.1 % nasal spray 2 sprays, Each Nare, 2 times daily, Use in each nostril as directed   azithromycin (ZITHROMAX Z-PAK) 250 MG tablet 2 tabs a day the first day, then 1 tab a day x 4 days   benzonatate (TESSALON) 100 mg, 3 times daily PRN   cetirizine (ZYRTEC) 10 mg, Oral, Daily PRN   Cholecalciferol (VITAMIN D3 PO) 2,000 mg, Oral, Daily   CINNAMON PO 1,000 mg, Oral, Every other day   clindamycin  (CLEOCIN T) 1 % lotion Topical, 2 times daily   Cyanocobalamin (VITAMIN B-12 PO) 1 tablet, Oral, Daily, Energy and probiotic   diltiazem (CARDIZEM) 30 mg, Oral, 3 times daily   ELDERBERRY PO Oral, Every other day  2000 mg   Emollient (DERMANAIL EX) Uses for Rosacea- as needed   fluticasone (FLONASE) 50 MCG/ACT nasal spray 2 sprays, Each Nare, As needed   LYSINE PO 1 tablet, Oral, As needed   MULTIPLE VITAMINS PO 1 tablet, Oral, 40 plus women's vitamin- New Chapter   Multiple Vitamins-Minerals (MULTIVITAMIN ADULTS PO) 1 tablet, Oral, Every morning, Focus Factor-nutrition for the Brain   NON FORMULARY Topical, As needed, Ivermectin/metronidazole mix cream   OVER THE COUNTER MEDICATION 1 tablet, Oral, Every morning, Cataplex C   triamcinolone cream (KENALOG) 0.1 % 1 application., Topical, As needed   TURMERIC PO 1 tablet, Oral, Every other day, New Chapter       Objective:   Physical Exam BP 128/86 (BP Location: Left Arm, Patient Position: Sitting, Cuff Size: Normal)    Pulse 98    Temp 98.1 F (36.7 C) (Oral)    Resp 16    Ht '5\' 2"'$  (1.575 m)    Wt 199 lb 8 oz (90.5 kg)    SpO2 98%    BMI 36.49 kg/m  General:   Well developed, NAD, BMI noted. HEENT:  Normocephalic .  Face symmetric, atraumatic Lungs:  CTA B Normal respiratory effort, no intercostal retractions, no accessory muscle use. Heart: RRR,  no murmur.  Lower extremities: no pretibial edema bilaterally  Skin: Not pale. Not jaundice Neurologic:  alert & oriented X3.  Speech normal, gait appropriate for age and unassisted Psych--  Cognition and judgment appear intact.  Cooperative with normal attention span and concentration.  Behavior appropriate. No anxious or depressed appearing.      Assessment      Assessment Dx colon cancer,2018, s/p surgery, no chemo;  GI Dr. Carlean Purl, colonoscopy 09-2018 GERD   Menopausal LMP 07-2021. Asthma Shingles December 2022, right face.  PLAN   A flutter. Dx at the last visit,  subsequently saw cardiology 02/05/2022: they rec do continue Cardizem and continue Eliquis for now. Had a Zio patch which documented episodes of a flutter, echo was essentially normal. The patient has multiple questions with regards to the echo, Zio patch and further management of A flutter.  Will reach out to cardiology and ask they discussed in detail results with the patient. Until then, continue Cardizem and Eliquis. OSA: Was appropriately referred for sleep study by cardiology, the patient request that to be a home sleep study.  Plan: Refer to neurology. Obesity: She is motivated to change, encouraged healthy diet and increase physical activity. Recommend to schedule a physical exam at her convenience. Allergies: Prescription sent for Zyrtec.  Recommend to avoid decongestants Time spent with the patient: 31 minutes   This visit occurred during the SARS-CoV-2 public health emergency.  Safety protocols were in place, including screening questions prior to the visit, additional usage of staff PPE, and extensive cleaning of exam room while observing appropriate contact time as indicated for disinfecting solutions.

## 2022-02-26 NOTE — Patient Instructions (Signed)
Continue present medications until you hear from cardiology ? ?I am referring you to a neurologist for evaluation of   sleep apnea ? ?Schedule a physical exam at your convenience ? ? ?

## 2022-02-27 ENCOUNTER — Telehealth (HOSPITAL_COMMUNITY): Payer: Self-pay | Admitting: Nurse Practitioner

## 2022-02-27 NOTE — Assessment & Plan Note (Signed)
A flutter. ?Dx at the last visit, subsequently saw cardiology 02/05/2022: they rec do continue Cardizem and continue Eliquis for now. ?Had a Zio patch which documented episodes of A flutter, echo was essentially normal. ?The patient has multiple questions with regards to the echo, Zio patch and further management of A flutter. ?ID turn to answer most of her questions, will reach out to cardiology and ask they discussed in detail results with the patient. ?Until then, continue Cardizem and Eliquis. ?OSA: Was appropriately referred for sleep study by cardiology, the patient request that to be a home sleep study.  Plan: Refer to neurology. ?Obesity: She is motivated to change, encouraged healthy diet and increase physical activity. ?Recommend to schedule a physical exam at her convenience. ?Allergies: Prescription sent for Zyrtec.  Recommend to avoid decongestants ?

## 2022-02-27 NOTE — Telephone Encounter (Signed)
Called and left message for patient to call back.  Pt needs f/u appt with Roderic Palau, NP to discuss Zio results later this week or early next week. ?

## 2022-02-27 NOTE — Telephone Encounter (Signed)
Patient called back and is agreeable to appt 03/06/22, per pt request,with AFib Clinic. ?

## 2022-02-28 ENCOUNTER — Ambulatory Visit: Payer: BC Managed Care – PPO | Admitting: Internal Medicine

## 2022-03-06 ENCOUNTER — Ambulatory Visit (HOSPITAL_COMMUNITY): Payer: BC Managed Care – PPO | Admitting: Nurse Practitioner

## 2022-03-13 ENCOUNTER — Other Ambulatory Visit: Payer: Self-pay

## 2022-03-13 ENCOUNTER — Ambulatory Visit (HOSPITAL_COMMUNITY)
Admission: RE | Admit: 2022-03-13 | Discharge: 2022-03-13 | Disposition: A | Discharge status: Home / Self Care | Payer: BC Managed Care – PPO | Source: Ambulatory Visit | Attending: Nurse Practitioner | Admitting: Nurse Practitioner

## 2022-03-13 ENCOUNTER — Encounter (HOSPITAL_COMMUNITY): Payer: Self-pay | Admitting: Nurse Practitioner

## 2022-03-13 VITALS — BP 142/88 | HR 143 | Ht 62.0 in | Wt 204.4 lb

## 2022-03-13 DIAGNOSIS — I480 Paroxysmal atrial fibrillation: Secondary | ICD-10-CM | POA: Insufficient documentation

## 2022-03-13 DIAGNOSIS — Z85038 Personal history of other malignant neoplasm of large intestine: Secondary | ICD-10-CM | POA: Insufficient documentation

## 2022-03-13 DIAGNOSIS — I4892 Unspecified atrial flutter: Secondary | ICD-10-CM | POA: Insufficient documentation

## 2022-03-13 DIAGNOSIS — F418 Other specified anxiety disorders: Secondary | ICD-10-CM | POA: Insufficient documentation

## 2022-03-13 DIAGNOSIS — Z7901 Long term (current) use of anticoagulants: Secondary | ICD-10-CM | POA: Diagnosis not present

## 2022-03-13 DIAGNOSIS — Z6837 Body mass index (BMI) 37.0-37.9, adult: Secondary | ICD-10-CM | POA: Insufficient documentation

## 2022-03-13 DIAGNOSIS — E669 Obesity, unspecified: Secondary | ICD-10-CM | POA: Diagnosis not present

## 2022-03-13 MED ORDER — DILTIAZEM HCL ER COATED BEADS 120 MG PO CP24: 120.0000 mg | ORAL_CAPSULE | Freq: Every day | ORAL | 3 refills | Status: DC

## 2022-03-13 NOTE — Patient Instructions (Signed)
Take Eliquis '5mg'$  twice a day ? ?Stop cardizem '30mg'$  tablets ? ? ?Start Cardizem '120mg'$  once a day start this evening ?

## 2022-03-13 NOTE — Progress Notes (Signed)
? ?Primary Care Physician: Colon Branch, MD ?Referring Physician: Dr. Larose Kells ? ? ?Savannah Cook is a 56 y.o. female with a h/o anxiety/depression, BMI of 37.24, s/p colon CA 2018, that is in the clinic for noticing palpitations for some time but EKG documenting typical atrial flutter in PCP office recently and being referred to the afib clinic for evaluation. She did return to SR at that office visit and is in [place] today.  ? ?She brings in a sack of supplements that she has taken or is taking currently. She has recently been started on 30 mg Cardizem tid and eliquis 5 mg bid of a CHA2DS2VASc  score of 1.  ? ?She was mailed a zio patch to put on last Friday, but was unclear with directions so this will be placed today.  ? ?F/u  in afib clinic, 03/13/22. Monitor was reviewed with her initially by Dr. Larose Kells and showed intermittent atrial flutter and referred back here to further discuss today. She reported that she did not feel atrial flutter was that significant so she cut back on her Cardizem 30 mg tid and her eliquis to once a day. Today ekg shows atrial  fibrillation at 143 bpm. She is tolerating well noting mostly flutters in the chest area. She has been ordered a sleep study. She noted some heart fluttering Sunday when she was cleaning out a closet but is not sure if the irregularity of her heart rhythm has been persistent since Sunday.  ? ?Today, she denies symptoms of palpitations, chest pain, shortness of breath, orthopnea, PND, lower extremity edema, dizziness, presyncope, syncope, or neurologic sequela. The patient is tolerating medications without difficulties and is otherwise without complaint today.  ? ?Past Medical History:  ?Diagnosis Date  ? Anxiety   ? hx of, no medications now  ? Atrial flutter ([place])   ? Colon cancer ([place]) 06/10/2017  ? GERD (gastroesophageal reflux disease)   ? Hx of colonic polyp - sessile serrated 10/08/2018  ? Iron deficiency anemia due to chronic blood loss   ? colon cancer  ? Seasonal  allergies   ? Shingles 11/2021  ? ?Past Surgical History:  ?Procedure Laterality Date  ? CHOLECYSTECTOMY N/A 07/25/2017  ? Procedure: CHOLECYSTECTOMY;  Surgeon: Jackolyn Confer, MD;  Location: [facility];  Service: General;  Laterality: N/A;  ? COLONOSCOPY  06/10/2018  ? w/Dr.Gessner   ? LAPAROSCOPIC PARTIAL COLECTOMY N/A 07/25/2017  ? Procedure: LAPAROSCOPIC PARTIAL COLECTOMY;  Surgeon: Jackolyn Confer, MD;  Location: [facility];  Service: General;  Laterality: N/A;  ? UPPER GASTROINTESTINAL ENDOSCOPY  06/10/2018  ? WISDOM TOOTH EXTRACTION    ? WRIST SURGERY Right 04/2012  ? ? ?Current Outpatient Medications  ?Medication Sig Dispense Refill  ? apixaban (ELIQUIS) 5 MG TABS tablet Take 1 tablet (5 mg total) by mouth 2 (two) times daily. 60 tablet 1  ? cetirizine (ZYRTEC) 10 MG tablet Take 1 tablet (10 mg total) by mouth daily as needed for allergies. 90 tablet 3  ? Cholecalciferol (VITAMIN D3 PO) Take 2,000 mg by mouth daily.    ? CINNAMON PO Take 1,000 mg by mouth every other day.    ? clindamycin (CLEOCIN T) 1 % lotion Apply topically 2 (two) times daily.    ? diltiazem (CARDIZEM CD) 120 MG 24 hr capsule Take 1 capsule (120 mg total) by mouth daily. 30 capsule 3  ? ELDERBERRY PO Take by mouth. Every day  2000 mg    ? Emollient [facility] EX) Uses for Rosacea-  as needed    ? fluticasone (FLONASE) 50 MCG/ACT nasal spray Place 2 sprays into both nostrils as needed for allergies or rhinitis.    ? MULTIPLE VITAMINS PO Take 1 tablet by mouth. 40 plus women's vitamin- [place]    ? OVER THE COUNTER MEDICATION Take 1 tablet by mouth every morning. Cataplex C    ? triamcinolone cream (KENALOG) 0.1 % Apply 1 application topically as needed.    ? ?No current facility-administered medications for this encounter.  ? ? ?Allergies  ?Allergen Reactions  ? Ceclor [Cefaclor] Rash  ? ? ?Social History  ? ?Socioeconomic History  ? Marital status: Significant Other  ?  Spouse name: Not on file  ? Number of children: 1  ? Years of education:  Not on file  ? Highest education level: Not on file  ?Occupational History  ? Occupation: Publishing copy, Glass blower/designer apartments  ?Tobacco Use  ? Smoking status: Former  ?  Types: Cigarettes  ?  Quit date: 12/17/1985  ?  Years since quitting: 36.2  ? Smokeless tobacco: Never  ?Vaping Use  ? Vaping Use: Never used  ?Substance and Sexual Activity  ? Alcohol use: Yes  ?  Comment: Rare  ? Drug use: No  ? Sexual activity: Not Currently  ?  Comment: INTERCOURSE AGE UNKOWN , SEXUAL PARTNERS LESS THAN 5  ?Other Topics Concern  ? Not on file  ?Social History Narrative  ? The patient is single, used to live w/ a partner until 11-2020  ? One daughter  ? is employed as a Publishing copy  ? 2 caffeinated beverages a day  ? ?Social Determinants of Health  ? ?Financial Resource Strain: Not on file  ?Food Insecurity: Not on file  ?Transportation Needs: Not on file  ?Physical Activity: Not on file  ?Stress: Not on file  ?Social Connections: Not on file  ?Intimate Partner Violence: Not on file  ? ? ?Family History  ?Problem Relation Age of Onset  ? Diabetes Mother   ? Hypertension Mother   ? Bipolar disorder Mother   ? COPD Mother   ? Diabetes Father   ? Hypertension Father   ? Lung cancer Father   ? Heart disease Maternal Grandmother   ? Colon cancer Maternal Grandfather 95  ? Colon polyps Neg Hx   ? Esophageal cancer Neg Hx   ? Rectal cancer Neg Hx   ? Stomach cancer Neg Hx   ? ? ?ROS- All systems are reviewed and negative except as per the HPI above ? ?Physical Exam: ?Vitals:  ? 03/13/22 1532  ?BP: (!) 142/88  ?Pulse: (!) 143  ?Weight: 92.7 kg  ?Height: '5\' 2"'$  (1.575 m)  ? ?Wt Readings from Last 3 Encounters:  ?03/13/22 92.7 kg  ?02/26/22 90.5 kg  ?02/05/22 92.4 kg  ? ? ?Labs: ?Lab Results  ?Component Value Date  ? NA 140 01/31/2022  ? K 4.7 01/31/2022  ? CL 104 01/31/2022  ? CO2 30 01/31/2022  ? GLUCOSE 101 (H) 01/31/2022  ? BUN 20 01/31/2022  ? CREATININE 0.77 01/31/2022  ? CALCIUM 9.7 01/31/2022  ? MG 2.2 01/31/2022  ? ?No results  found for: INR ?Lab Results  ?Component Value Date  ? CHOL 210 (H) 01/31/2022  ? HDL 57.80 01/31/2022  ? LDLCALC 131 (H) 01/31/2022  ? TRIG 107.0 01/31/2022  ? ? ? ?GEN- The patient is well appearing, alert and oriented x 3 today.   ?Head- normocephalic, atraumatic ?Eyes-  Sclera clear, conjunctiva pink ?Ears-  hearing intact ?Oropharynx- clear ?Neck- supple, no JVP ?Lymph- no cervical lymphadenopathy ?Lungs- Clear to ausculation bilaterally, normal work of breathing ?Heart- irregular rate and rhythm, no murmurs, rubs or gallops, PMI not laterally displaced ?GI- soft, NT, ND, + BS ?Extremities- no clubbing, cyanosis, or edema ?MS- no significant deformity or atrophy ?Skin- no rash or lesion ?Psych- euthymic mood, full affect ?Neuro- strength and sensation are intact ? ?EKG-Vent. rate 143 BPM ?PR interval * ms ?QRS duration 72 ms ?QT/QTcB 296/456 ms ?P-R-T axes * -2 28 ?Atrial fibrillation with rapid ventricular response with premature ventricular or aberrantly conducted complexes ?Nonspecific T wave abnormality ?Abnormal ECG ? ? ?Epic records reviewed ? ?Zio patch monitor results-Sinus rhythm including sinus bradycardia and sinus tachycardia. ?She has multiple episodes of paroxysmal atrial flutter. Her atrial flutter is frequently associated with rapid ventricular response. Her longest episode of atrial flutter was 6 hours and 20 minutes with an average rate of 109 bpm. ?The patient's symptoms are associated with atrial flutter. ?Rare episodes of supraventricular tachycardia lasting up to 21 seconds. This also might have been an episode of atrial flutter. ?Resulted by Mertie Moores, MD ? ?Echo- 1. Left ventricular ejection fraction, by estimation, is 60 to 65%. Left  ?ventricular ejection fraction by 3D volume is 64 %. The left ventricle has  ?normal function. The left ventricle has no regional wall motion  ?abnormalities. Left ventricular diastolic  ? parameters are consistent with Grade I diastolic dysfunction  (impaired  ?relaxation). The average left ventricular global longitudinal strain is  ?-24.0 %. The global longitudinal strain is normal.  ? 2. Right ventricular systolic function is normal. The right ventri

## 2022-03-14 ENCOUNTER — Encounter (HOSPITAL_COMMUNITY): Payer: Self-pay

## 2022-03-19 ENCOUNTER — Telehealth: Payer: Self-pay | Admitting: *Deleted

## 2022-03-19 NOTE — Telephone Encounter (Signed)
4/3 TURNER TO READ ?PER [place] ,NO PA REQUIRED ?REF# [id number] ?

## 2022-03-20 ENCOUNTER — Ambulatory Visit (HOSPITAL_COMMUNITY)
Admission: RE | Admit: 2022-03-20 | Discharge: 2022-03-20 | Disposition: A | Discharge status: Home / Self Care | Payer: BC Managed Care – PPO | Source: Ambulatory Visit | Attending: Nurse Practitioner | Admitting: Nurse Practitioner

## 2022-03-20 VITALS — BP 146/66 | HR 81 | Ht 62.0 in | Wt 201.8 lb

## 2022-03-20 DIAGNOSIS — R0683 Snoring: Secondary | ICD-10-CM | POA: Diagnosis not present

## 2022-03-20 DIAGNOSIS — I4892 Unspecified atrial flutter: Secondary | ICD-10-CM | POA: Insufficient documentation

## 2022-03-20 DIAGNOSIS — Z79899 Other long term (current) drug therapy: Secondary | ICD-10-CM | POA: Diagnosis not present

## 2022-03-20 DIAGNOSIS — Z7901 Long term (current) use of anticoagulants: Secondary | ICD-10-CM | POA: Insufficient documentation

## 2022-03-20 DIAGNOSIS — I480 Paroxysmal atrial fibrillation: Secondary | ICD-10-CM

## 2022-03-20 DIAGNOSIS — Z85038 Personal history of other malignant neoplasm of large intestine: Secondary | ICD-10-CM | POA: Diagnosis not present

## 2022-03-20 DIAGNOSIS — D6869 Other thrombophilia: Secondary | ICD-10-CM | POA: Diagnosis not present

## 2022-03-20 DIAGNOSIS — I4891 Unspecified atrial fibrillation: Secondary | ICD-10-CM | POA: Insufficient documentation

## 2022-03-20 NOTE — Progress Notes (Signed)
? ?Primary Care Physician: Colon Branch, MD ?Referring Physician: Dr. Larose Kells ? ? ?Savannah Cook is a 56 y.o. female with a h/o anxiety/depression, BMI of 37.24, s/p colon CA 2018, that is in the clinic for noticing palpitations for some time but EKG documenting typical atrial flutter in PCP office recently and being referred to the afib clinic for evaluation. She did return to SR at that office visit and is in [place] today.  ? ?She brings in a sack of supplements that she has taken or is taking currently. She has recently been started on 30 mg Cardizem tid and eliquis 5 mg bid of a CHA2DS2VASc  score of 1.  ? ?She was mailed a zio patch to put on last Friday, but was unclear with directions so this will be placed today.  ? ?F/u  in afib clinic, 03/13/22. Monitor was reviewed with her initially by Dr. Larose Kells and showed intermittent atrial flutter and referred back here to further discuss today. She reported that she did not feel atrial flutter was that significant so she cut back on her Cardizem 30 mg tid and her eliquis to once a day. Today ekg shows atrial  fibrillation at 143 bpm. She is tolerating well noting mostly flutters in the chest area. She has been ordered a sleep study. She noted some heart fluttering Sunday when she was cleaning out a closet but is not sure if the irregularity of her heart rhythm has been persistent since Sunday.  ? ?F/u in afib clinic, 03/20/22. She states improvement since taking Cardizem daily. She is in [place] today.  ? ?Today, she denies symptoms of palpitations, chest pain, shortness of breath, orthopnea, PND, lower extremity edema, dizziness, presyncope, syncope, or neurologic sequela. The patient is tolerating medications without difficulties and is otherwise without complaint today.  ? ?Past Medical History:  ?Diagnosis Date  ? Anxiety   ? hx of, no medications now  ? Atrial flutter ([place])   ? Colon cancer ([place]) 06/10/2017  ? GERD (gastroesophageal reflux disease)   ? Hx of colonic polyp -  sessile serrated 10/08/2018  ? Iron deficiency anemia due to chronic blood loss   ? colon cancer  ? Seasonal allergies   ? Shingles 11/2021  ? ?Past Surgical History:  ?Procedure Laterality Date  ? CHOLECYSTECTOMY N/A 07/25/2017  ? Procedure: CHOLECYSTECTOMY;  Surgeon: Jackolyn Confer, MD;  Location: [facility];  Service: General;  Laterality: N/A;  ? COLONOSCOPY  06/10/2018  ? w/Dr.Gessner   ? LAPAROSCOPIC PARTIAL COLECTOMY N/A 07/25/2017  ? Procedure: LAPAROSCOPIC PARTIAL COLECTOMY;  Surgeon: Jackolyn Confer, MD;  Location: [facility];  Service: General;  Laterality: N/A;  ? UPPER GASTROINTESTINAL ENDOSCOPY  06/10/2018  ? WISDOM TOOTH EXTRACTION    ? WRIST SURGERY Right 04/2012  ? ? ?Current Outpatient Medications  ?Medication Sig Dispense Refill  ? apixaban (ELIQUIS) 5 MG TABS tablet Take 1 tablet (5 mg total) by mouth 2 (two) times daily. 60 tablet 1  ? cetirizine (ZYRTEC) 10 MG tablet Take 1 tablet (10 mg total) by mouth daily as needed for allergies. 90 tablet 3  ? Cholecalciferol (VITAMIN D3 PO) Take 2,000 mg by mouth daily.    ? CINNAMON PO Take 1,000 mg by mouth every other day.    ? clindamycin (CLEOCIN T) 1 % lotion Apply topically as needed.    ? diltiazem (CARDIZEM CD) 120 MG 24 hr capsule Take 1 capsule (120 mg total) by mouth daily. 30 capsule 3  ? ELDERBERRY PO Take  by mouth. Every day  2000 mg    ? Emollient [facility] EX) Uses for Rosacea- as needed    ? fluticasone (FLONASE) 50 MCG/ACT nasal spray Place 2 sprays into both nostrils as needed for allergies or rhinitis.    ? Multiple Vitamin (MULTIVITAMIN ADULT PO) Take 1 tablet by mouth every morning. One daily- Every Woman's 55 plus- [place]    ? OVER THE COUNTER MEDICATION Take 1 tablet by mouth every morning. Cataplex C    ? triamcinolone cream (KENALOG) 0.1 % Apply 1 application topically as needed.    ? ?No current facility-administered medications for this encounter.  ? ? ?Allergies  ?Allergen Reactions  ? Ceclor [Cefaclor] Rash  ? ? ?Social History   ? ?Socioeconomic History  ? Marital status: Significant Other  ?  Spouse name: Not on file  ? Number of children: 1  ? Years of education: Not on file  ? Highest education level: Not on file  ?Occupational History  ? Occupation: Publishing copy, Glass blower/designer apartments  ?Tobacco Use  ? Smoking status: Former  ?  Types: Cigarettes  ?  Quit date: 12/17/1985  ?  Years since quitting: 36.2  ? Smokeless tobacco: Never  ?Vaping Use  ? Vaping Use: Never used  ?Substance and Sexual Activity  ? Alcohol use: Yes  ?  Comment: Rare  ? Drug use: No  ? Sexual activity: Not Currently  ?  Comment: INTERCOURSE AGE UNKOWN , SEXUAL PARTNERS LESS THAN 5  ?Other Topics Concern  ? Not on file  ?Social History Narrative  ? The patient is single, used to live w/ a partner until 11-2020  ? One daughter  ? is employed as a Publishing copy  ? 2 caffeinated beverages a day  ? ?Social Determinants of Health  ? ?Financial Resource Strain: Not on file  ?Food Insecurity: Not on file  ?Transportation Needs: Not on file  ?Physical Activity: Not on file  ?Stress: Not on file  ?Social Connections: Not on file  ?Intimate Partner Violence: Not on file  ? ? ?Family History  ?Problem Relation Age of Onset  ? Diabetes Mother   ? Hypertension Mother   ? Bipolar disorder Mother   ? COPD Mother   ? Diabetes Father   ? Hypertension Father   ? Lung cancer Father   ? Heart disease Maternal Grandmother   ? Colon cancer Maternal Grandfather 95  ? Colon polyps Neg Hx   ? Esophageal cancer Neg Hx   ? Rectal cancer Neg Hx   ? Stomach cancer Neg Hx   ? ? ?ROS- All systems are reviewed and negative except as per the HPI above ? ?Physical Exam: ?Vitals:  ? 03/20/22 0934  ?BP: (!) 146/66  ?Pulse: 81  ?Weight: 91.5 kg  ?Height: '5\' 2"'$  (1.575 m)  ? ?Wt Readings from Last 3 Encounters:  ?03/20/22 91.5 kg  ?03/13/22 92.7 kg  ?02/26/22 90.5 kg  ? ? ?Labs: ?Lab Results  ?Component Value Date  ? NA 140 01/31/2022  ? K 4.7 01/31/2022  ? CL 104 01/31/2022  ? CO2 30 01/31/2022  ? GLUCOSE  101 (H) 01/31/2022  ? BUN 20 01/31/2022  ? CREATININE 0.77 01/31/2022  ? CALCIUM 9.7 01/31/2022  ? MG 2.2 01/31/2022  ? ?No results found for: INR ?Lab Results  ?Component Value Date  ? CHOL 210 (H) 01/31/2022  ? HDL 57.80 01/31/2022  ? LDLCALC 131 (H) 01/31/2022  ? TRIG 107.0 01/31/2022  ? ? ? ?GEN- The  patient is well appearing, alert and oriented x 3 today.   ?Head- normocephalic, atraumatic ?Eyes-  Sclera clear, conjunctiva pink ?Ears- hearing intact ?Oropharynx- clear ?Neck- supple, no JVP ?Lymph- no cervical lymphadenopathy ?Lungs- Clear to ausculation bilaterally, normal work of breathing ?Heart -regular rate and rhythm, no murmurs, rubs or gallops, PMI not laterally displaced ?GI- soft, NT, ND, + BS ?Extremities- no clubbing, cyanosis, or edema ?MS- no significant deformity or atrophy ?Skin- no rash or lesion ?Psych- euthymic mood, full affect ?Neuro- strength and sensation are intact ? ?EKG-Vent. rate 81 BPM ?PR interval 142 ms ?QRS duration 80 ms ?QT/QTcB 378/439 ms ?P-R-T axes 53 -15 44 ?Normal sinus rhythm ?Normal ECG ?When compared with ECG of 13-Mar-2022 15:49, ?PREVIOUS ECG IS PRESENT ? ? ?Epic records reviewed ? ?Zio patch monitor results-Sinus rhythm including sinus bradycardia and sinus tachycardia. ?She has multiple episodes of paroxysmal atrial flutter. Her atrial flutter is frequently associated with rapid ventricular response. Her longest episode of atrial flutter was 6 hours and 20 minutes with an average rate of 109 bpm. ?The patient's symptoms are associated with atrial flutter. ?Rare episodes of supraventricular tachycardia lasting up to 21 seconds. This also might have been an episode of atrial flutter. ?Resulted by Mertie Moores, MD ? ?Echo- 1. Left ventricular ejection fraction, by estimation, is 60 to 65%. Left  ?ventricular ejection fraction by 3D volume is 64 %. The left ventricle has  ?normal function. The left ventricle has no regional wall motion  ?abnormalities. Left ventricular  diastolic  ? parameters are consistent with Grade I diastolic dysfunction (impaired  ?relaxation). The average left ventricular global longitudinal strain is  ?-24.0 %. The global longitudinal strain is

## 2022-03-21 ENCOUNTER — Ambulatory Visit (HOSPITAL_COMMUNITY): Payer: BC Managed Care – PPO | Admitting: Nurse Practitioner

## 2022-03-27 ENCOUNTER — Other Ambulatory Visit: Payer: Self-pay | Admitting: Internal Medicine

## 2022-03-27 NOTE — Telephone Encounter (Signed)
APPROVED -NO PA REQUIRED- TURNER READ  ?REF# [id number] ?PER KEYOSHA B. ?

## 2022-03-28 ENCOUNTER — Telehealth: Payer: Self-pay | Admitting: Internal Medicine

## 2022-03-28 ENCOUNTER — Ambulatory Visit: Payer: BC Managed Care – PPO | Admitting: Nurse Practitioner

## 2022-03-28 NOTE — Telephone Encounter (Signed)
Pt called and would like to cancel sleep study ordered per Dr. Larose Kells with [facility] since her cardiologist ordered an at home sleep study instead. Please advise. ?

## 2022-03-28 NOTE — Telephone Encounter (Signed)
Okay with me, thank you 

## 2022-04-03 ENCOUNTER — Other Ambulatory Visit (HOSPITAL_COMMUNITY)
Admission: RE | Admit: 2022-04-03 | Discharge: 2022-04-03 | Disposition: A | Discharge status: Home / Self Care | Payer: BC Managed Care – PPO | Source: Ambulatory Visit | Attending: Nurse Practitioner | Admitting: Nurse Practitioner

## 2022-04-03 ENCOUNTER — Ambulatory Visit (INDEPENDENT_AMBULATORY_CARE_PROVIDER_SITE_OTHER): Payer: BC Managed Care – PPO | Admitting: Nurse Practitioner

## 2022-04-03 ENCOUNTER — Encounter: Payer: Self-pay | Admitting: Nurse Practitioner

## 2022-04-03 VITALS — BP 122/76 | Ht 61.5 in | Wt 200.0 lb

## 2022-04-03 DIAGNOSIS — Z780 Asymptomatic menopausal state: Secondary | ICD-10-CM

## 2022-04-03 DIAGNOSIS — Z01419 Encounter for gynecological examination (general) (routine) without abnormal findings: Secondary | ICD-10-CM | POA: Diagnosis not present

## 2022-04-03 NOTE — Progress Notes (Signed)
? ?  Savannah Cook 1966/08/25 [id number] ? ? ?History:  56 y.o. G1P1 presents for annual exam. Postmenopausal - no HRT, no bleeding. Normal pap history. 2018 colon cancer. On Eliquis for A. Flutter/fib. ? ?Gynecologic History ?Patient's last menstrual period was 03/13/2021. ?  ?Contraception/Family planning: abstinence ?Sexually active: Yes ? ?Health Maintenance ?Last Pap: 08/16/2019. Results were: Normal ?Last mammogram: 08/16/2021. Results were: Normal ?Last colonoscopy: 10/05/2021. Results were: Benign polyps, 5-year recall ?Last Dexa: N/A ? ?Past medical history, past surgical history, family history and social history were all reviewed and documented in the EPIC chart. Works for apartment complex. Boyfriend, lives in [place]. Daughter lives in [place], has 23 month old daughter, speech therapist.  ? ?ROS:  A ROS was performed and pertinent positives and negatives are included. ? ?Exam: ? ?Vitals:  ? 04/03/22 1600  ?BP: 122/76  ?Weight: 200 lb (90.7 kg)  ?Height: 5' 1.5" (1.562 m)  ? ? ?Body mass index is 37.18 kg/m?. ? ?General appearance:  Normal ?Thyroid:  Symmetrical, normal in size, without palpable masses or nodularity. ?Respiratory ? Auscultation:  Clear without wheezing or rhonchi ?Cardiovascular ? Auscultation:  Regular rate, without rubs, murmurs or gallops ? Edema/varicosities:  Not grossly evident ?Abdominal ? Soft,nontender, without masses, guarding or rebound. ? Liver/spleen:  No organomegaly noted ? Hernia:  None appreciated ? Skin ? Inspection:  Grossly normal ?  ?Breasts: Examined lying and sitting.  ? Right: Without masses, retractions, discharge or axillary adenopathy. ? ? Left: Without masses, retractions, discharge or axillary adenopathy. ?Genitourinary  ? Inguinal/mons:  Normal without inguinal adenopathy ? External genitalia:  Normal appearing vulva with no masses, tenderness, or lesions ? BUS/Urethra/Skene's glands:  Normal ? Vagina:  Normal appearing with normal color and discharge, no  lesions ? Cervix:  Normal appearing without discharge or lesions ? Uterus:  Normal in size, shape and contour.  Midline and mobile, nontender ? Adnexa/parametria:   ?  Rt: Normal in size, without masses or tenderness. ?  Lt: Normal in size, without masses or tenderness. ? Anus and perineum: Normal ? Digital rectal exam: Normal sphincter tone without palpated masses or tenderness ? ?Patient informed chaperone available to be present for breast and pelvic exam. Patient has requested no chaperone to be present. Patient has been advised what will be completed during breast and pelvic exam.  ? ?Assessment/Plan:  56 y.o. G1P1 for annual exam.  ? ?Well female exam with routine gynecological exam - Plan: Cytology - PAP( [place]). Education provided on SBEs, importance of preventative screenings, current guidelines, high calcium diet, regular exercise, and multivitamin daily.  Labs with PCP.  ? ?Postmenopausal - no HRT, no bleeding.  ? ?Screening for cervical cancer - Normal Pap history. Pap today.  ? ?Screening for breast cancer - Normal mammogram history.  Continue annual screenings.  Normal breast exam today. ? ?Screening for colon cancer - 2022 colonoscopy. History of colon cancer in 2018. Will repeat at 5-year interval per GI's recommendation. ? ?Screening for osteoporosis - Average risk. Will plan for DXA at age 53. Continue Vitamin D supplement, high calcium diet, and increase exercise.  ? ?Return in 1 year for annual.  ? ? ? ?Tamela Gammon DNP, 7:47 AM 04/04/2022 ? ?

## 2022-04-04 LAB — CYTOLOGY - PAP
Comment: NEGATIVE
Diagnosis: NEGATIVE
High risk HPV: NEGATIVE

## 2022-04-11 ENCOUNTER — Encounter: Payer: Self-pay | Admitting: Internal Medicine

## 2022-04-11 DIAGNOSIS — D225 Melanocytic nevi of trunk: Secondary | ICD-10-CM | POA: Diagnosis not present

## 2022-04-11 DIAGNOSIS — L578 Other skin changes due to chronic exposure to nonionizing radiation: Secondary | ICD-10-CM | POA: Diagnosis not present

## 2022-04-11 DIAGNOSIS — L821 Other seborrheic keratosis: Secondary | ICD-10-CM | POA: Diagnosis not present

## 2022-04-11 DIAGNOSIS — L814 Other melanin hyperpigmentation: Secondary | ICD-10-CM | POA: Diagnosis not present

## 2022-04-17 ENCOUNTER — Other Ambulatory Visit (HOSPITAL_COMMUNITY): Payer: Self-pay | Admitting: *Deleted

## 2022-04-17 MED ORDER — APIXABAN 5 MG PO TABS
5.0000 mg | ORAL_TABLET | Freq: Two times a day (BID) | ORAL | 3 refills | Status: DC
Start: 2022-04-17 — End: 2022-04-19

## 2022-04-19 ENCOUNTER — Encounter (HOSPITAL_COMMUNITY): Payer: Self-pay | Admitting: Nurse Practitioner

## 2022-04-19 ENCOUNTER — Ambulatory Visit (HOSPITAL_COMMUNITY)
Admission: RE | Admit: 2022-04-19 | Discharge: 2022-04-19 | Disposition: A | Discharge status: Home / Self Care | Payer: BC Managed Care – PPO | Source: Ambulatory Visit | Attending: Nurse Practitioner | Admitting: Nurse Practitioner

## 2022-04-19 VITALS — BP 122/82 | HR 72 | Ht 61.5 in | Wt 199.4 lb

## 2022-04-19 DIAGNOSIS — Z79899 Other long term (current) drug therapy: Secondary | ICD-10-CM | POA: Insufficient documentation

## 2022-04-19 DIAGNOSIS — Z7901 Long term (current) use of anticoagulants: Secondary | ICD-10-CM | POA: Insufficient documentation

## 2022-04-19 DIAGNOSIS — Z6837 Body mass index (BMI) 37.0-37.9, adult: Secondary | ICD-10-CM | POA: Diagnosis not present

## 2022-04-19 DIAGNOSIS — I4892 Unspecified atrial flutter: Secondary | ICD-10-CM | POA: Diagnosis not present

## 2022-04-19 DIAGNOSIS — I480 Paroxysmal atrial fibrillation: Secondary | ICD-10-CM | POA: Diagnosis not present

## 2022-04-19 DIAGNOSIS — D6869 Other thrombophilia: Secondary | ICD-10-CM | POA: Diagnosis not present

## 2022-04-19 DIAGNOSIS — Z85038 Personal history of other malignant neoplasm of large intestine: Secondary | ICD-10-CM | POA: Diagnosis not present

## 2022-04-19 NOTE — Progress Notes (Signed)
? ?Primary Care Physician: Colon Branch, MD ?Referring Physician: Dr. Larose Kells ? ? ?Savannah Cook is a 56 y.o. female with a h/o anxiety/depression, BMI of 37.24, s/p colon CA 2018, that is in the clinic for noticing palpitations for some time but EKG documenting typical atrial flutter in PCP office recently and being referred to the afib clinic for evaluation. She did return to SR at that office visit and is in [place] today.  ? ?She brings in a sack of supplements that she has taken or is taking currently. She has recently been started on 30 mg Cardizem tid and eliquis 5 mg bid of a CHA2DS2VASc  score of 1.  ? ?She was mailed a zio patch to put on last Friday, but was unclear with directions so this will be placed today.  ? ?F/u  in afib clinic, 03/13/22. Monitor was reviewed with her initially by Dr. Larose Kells and showed intermittent atrial flutter and referred back here to further discuss today. She reported that she did not feel atrial flutter was that significant so she cut back on her Cardizem 30 mg tid and her eliquis to once a day. Today ekg shows atrial  fibrillation at 143 bpm. She is tolerating well noting mostly flutters in the chest area. She has been ordered a sleep study. She noted some heart fluttering Sunday when she was cleaning out a closet but is not sure if the irregularity of her heart rhythm has been persistent since Sunday.  ? ?F/u in afib clinic, 03/20/22. She states improvement since taking Cardizem daily. She is in SR today.  ? ?F/u in afib clinic, 04/19/22. She is in SR today. Sleep study was ordered but pt has a very high deductible and her insurance will not approve the study. She only had one short afib episode yesterday. She can stop eliquis as she just has a CHA2DS2VASc  score of 1, and her afib is minimal now. She now has   a device to run ekg's and save to her phone. Reviewed today.  ? ?Today, she denies symptoms of palpitations, chest pain, shortness of breath, orthopnea, PND, lower extremity edema,  dizziness, presyncope, syncope, or neurologic sequela. The patient is tolerating medications without difficulties and is otherwise without complaint today.  ? ?Past Medical History:  ?Diagnosis Date  ? Anxiety   ? hx of, no medications now  ? Atrial fibrillation ([place])   ? Atrial flutter ([place])   ? Colon cancer ([place]) 06/10/2017  ? GERD (gastroesophageal reflux disease)   ? Hx of colonic polyp - sessile serrated 10/08/2018  ? Iron deficiency anemia due to chronic blood loss   ? colon cancer  ? Seasonal allergies   ? Shingles 11/2021  ? ?Past Surgical History:  ?Procedure Laterality Date  ? CHOLECYSTECTOMY N/A 07/25/2017  ? Procedure: CHOLECYSTECTOMY;  Surgeon: Jackolyn Confer, MD;  Location: [facility];  Service: General;  Laterality: N/A;  ? COLONOSCOPY  06/10/2018  ? w/Dr.Gessner   ? LAPAROSCOPIC PARTIAL COLECTOMY N/A 07/25/2017  ? Procedure: LAPAROSCOPIC PARTIAL COLECTOMY;  Surgeon: Jackolyn Confer, MD;  Location: [facility];  Service: General;  Laterality: N/A;  ? UPPER GASTROINTESTINAL ENDOSCOPY  06/10/2018  ? WISDOM TOOTH EXTRACTION    ? WRIST SURGERY Right 04/2012  ? ? ?Current Outpatient Medications  ?Medication Sig Dispense Refill  ? apixaban (ELIQUIS) 5 MG TABS tablet Take 1 tablet (5 mg total) by mouth 2 (two) times daily. 60 tablet 3  ? cetirizine (ZYRTEC) 10 MG tablet Take 1 tablet (  10 mg total) by mouth daily as needed for allergies. 90 tablet 3  ? Cholecalciferol (VITAMIN D3 PO) Take 2,000 mg by mouth daily.    ? CINNAMON PO Take 1,000 mg by mouth every other day.    ? clindamycin (CLEOCIN T) 1 % lotion Apply topically as needed.    ? diltiazem (CARDIZEM CD) 120 MG 24 hr capsule Take 1 capsule (120 mg total) by mouth daily. 30 capsule 3  ? ELDERBERRY PO Take by mouth. Every day  2000 mg    ? fluticasone (FLONASE) 50 MCG/ACT nasal spray Place 2 sprays into both nostrils as needed for allergies or rhinitis.    ? Multiple Vitamin (MULTIVITAMIN ADULT PO) Take 1 tablet by mouth every morning. One daily- Every Woman's  55 plus- [place]    ? OVER THE COUNTER MEDICATION Take 1 tablet by mouth every morning. Cataplex C    ? triamcinolone cream (KENALOG) 0.1 % Apply 1 application topically as needed.    ? ?No current facility-administered medications for this encounter.  ? ? ?Allergies  ?Allergen Reactions  ? Ceclor [Cefaclor] Rash  ? ? ?Social History  ? ?Socioeconomic History  ? Marital status: Significant Other  ?  Spouse name: Not on file  ? Number of children: 1  ? Years of education: Not on file  ? Highest education level: Not on file  ?Occupational History  ? Occupation: Publishing copy, Glass blower/designer apartments  ?Tobacco Use  ? Smoking status: Former  ?  Types: Cigarettes  ?  Quit date: 12/17/1985  ?  Years since quitting: 36.3  ? Smokeless tobacco: Never  ?Vaping Use  ? Vaping Use: Never used  ?Substance and Sexual Activity  ? Alcohol use: Yes  ?  Comment: Rare  ? Drug use: No  ? Sexual activity: Yes  ?  Birth control/protection: Post-menopausal  ?  Comment: INTERCOURSE AGE UNKOWN , SEXUAL PARTNERS LESS THAN 5  ?Other Topics Concern  ? Not on file  ?Social History Narrative  ? The patient is single, used to live w/ a partner until 11-2020  ? One daughter  ? is employed as a Publishing copy  ? 2 caffeinated beverages a day  ? ?Social Determinants of Health  ? ?Financial Resource Strain: Not on file  ?Food Insecurity: Not on file  ?Transportation Needs: Not on file  ?Physical Activity: Not on file  ?Stress: Not on file  ?Social Connections: Not on file  ?Intimate Partner Violence: Not on file  ? ? ?Family History  ?Problem Relation Age of Onset  ? Diabetes Mother   ? Hypertension Mother   ? Bipolar disorder Mother   ? COPD Mother   ? Diabetes Father   ? Hypertension Father   ? Lung cancer Father   ? Heart disease Maternal Grandmother   ? Colon cancer Maternal Grandfather 95  ? Colon polyps Neg Hx   ? Esophageal cancer Neg Hx   ? Rectal cancer Neg Hx   ? Stomach cancer Neg Hx   ? ? ?ROS- All systems are reviewed and negative except  as per the HPI above ? ?Physical Exam: ?There were no vitals filed for this visit. ? ?Wt Readings from Last 3 Encounters:  ?04/03/22 90.7 kg  ?03/20/22 91.5 kg  ?03/13/22 92.7 kg  ? ? ?Labs: ?Lab Results  ?Component Value Date  ? NA 140 01/31/2022  ? K 4.7 01/31/2022  ? CL 104 01/31/2022  ? CO2 30 01/31/2022  ? GLUCOSE 101 (H) 01/31/2022  ?  BUN 20 01/31/2022  ? CREATININE 0.77 01/31/2022  ? CALCIUM 9.7 01/31/2022  ? MG 2.2 01/31/2022  ? ?No results found for: INR ?Lab Results  ?Component Value Date  ? CHOL 210 (H) 01/31/2022  ? HDL 57.80 01/31/2022  ? LDLCALC 131 (H) 01/31/2022  ? TRIG 107.0 01/31/2022  ? ? ? ?GEN- The patient is well appearing, alert and oriented x 3 today.   ?Head- normocephalic, atraumatic ?Eyes-  Sclera clear, conjunctiva pink ?Ears- hearing intact ?Oropharynx- clear ?Neck- supple, no JVP ?Lymph- no cervical lymphadenopathy ?Lungs- Clear to ausculation bilaterally, normal work of breathing ?Heart -regular rate and rhythm, no murmurs, rubs or gallops, PMI not laterally displaced ?GI- soft, NT, ND, + BS ?Extremities- no clubbing, cyanosis, or edema ?MS- no significant deformity or atrophy ?Skin- no rash or lesion ?Psych- euthymic mood, full affect ?Neuro- strength and sensation are intact ? ?EKG-Vent. rate 72 BPM ?PR interval 142 ms ?QRS duration 80 ms ?QT/QTcB 394/431 ms ?P-R-T axes 49 -2 41 ?Normal sinus rhythm ?Normal ECG ?When compared with ECG of 20-Mar-2022 09:45, ?PREVIOUS ECG IS PRESENT ? ? ?Epic records reviewed ? ?Zio patch monitor results-Sinus rhythm including sinus bradycardia and sinus tachycardia. ?She has multiple episodes of paroxysmal atrial flutter. Her atrial flutter is frequently associated with rapid ventricular response. Her longest episode of atrial flutter was 6 hours and 20 minutes with an average rate of 109 bpm. ?The patient's symptoms are associated with atrial flutter. ?Rare episodes of supraventricular tachycardia lasting up to 21 seconds. This also might have been  an episode of atrial flutter. ?Resulted by Mertie Moores, MD ? ?Echo- 1. Left ventricular ejection fraction, by estimation, is 60 to 65%. Left  ?ventricular ejection fraction by 3D volume is 64 %. The

## 2022-05-04 ENCOUNTER — Telehealth: Payer: Self-pay | Admitting: Internal Medicine

## 2022-05-04 NOTE — Telephone Encounter (Signed)
Pt called stating that the clinic she was referred was not covered under her insurance because of it being coded under a hospital. Pt is asking if there are any clinics that Dr. Larose Kells would recommend that do not do split billing that she could see about treating her afib.

## 2022-05-07 ENCOUNTER — Encounter: Payer: Self-pay | Admitting: Internal Medicine

## 2022-05-07 NOTE — Telephone Encounter (Signed)
Received fax confirmation

## 2022-05-07 NOTE — Telephone Encounter (Signed)
Pt called in this morning- requesting to be excused for jury duty this time d/t anxiety and heart issues (Afib). She requests letter to be faxed to her at [phone].

## 2022-05-07 NOTE — Telephone Encounter (Signed)
Spoke w/ Pt- informed her I am unsure which [facility] clinic would bill outpatient vs hospital or if they would all bill under hospital services- I recommended she contact the A Fib clinic they should be able to answer this for her. Pt verbalized understanding.

## 2022-05-08 ENCOUNTER — Other Ambulatory Visit (HOSPITAL_COMMUNITY): Payer: Self-pay | Admitting: *Deleted

## 2022-05-08 DIAGNOSIS — I480 Paroxysmal atrial fibrillation: Secondary | ICD-10-CM

## 2022-05-21 ENCOUNTER — Encounter (HOSPITAL_BASED_OUTPATIENT_CLINIC_OR_DEPARTMENT_OTHER): Payer: BC Managed Care – PPO | Admitting: Cardiology

## 2022-05-24 ENCOUNTER — Encounter: Payer: Self-pay | Admitting: Nurse Practitioner

## 2022-05-24 ENCOUNTER — Ambulatory Visit: Payer: BC Managed Care – PPO | Admitting: Nurse Practitioner

## 2022-05-24 VITALS — BP 120/78

## 2022-05-24 DIAGNOSIS — B9689 Other specified bacterial agents as the cause of diseases classified elsewhere: Secondary | ICD-10-CM

## 2022-05-24 DIAGNOSIS — N898 Other specified noninflammatory disorders of vagina: Secondary | ICD-10-CM | POA: Diagnosis not present

## 2022-05-24 DIAGNOSIS — R102 Pelvic and perineal pain: Secondary | ICD-10-CM

## 2022-05-24 DIAGNOSIS — N760 Acute vaginitis: Secondary | ICD-10-CM | POA: Diagnosis not present

## 2022-05-24 LAB — WET PREP FOR TRICH, YEAST, CLUE

## 2022-05-24 MED ORDER — METRONIDAZOLE 500 MG PO TABS: 500.0000 mg | ORAL_TABLET | Freq: Two times a day (BID) | ORAL | 0 refills | Status: DC

## 2022-05-24 NOTE — Progress Notes (Signed)
   Acute Office Visit  Subjective:    Patient ID: Savannah Cook, female    DOB: 01-Dec-1966, 56 y.o.   MRN: [id number]   HPI 56 y.o. presents today for vaginal odor and pelvic discomfort. Denies discharge or itching.  She did have very light vaginal spotting for 2-3 days. LMP 07/2021. Has been under lots of stress. Declines STD screening. Sexually active with 1 partner.    Review of Systems  Constitutional: Negative.   Genitourinary:  Positive for pelvic pain (Discomfort). Negative for dysuria, flank pain, frequency, hematuria, vaginal discharge and vaginal pain.       Vaginal odor       Objective:    Physical Exam Constitutional:      Appearance: Normal appearance.  Genitourinary:    General: Normal vulva.     Vagina: Vaginal discharge present. No erythema.     Cervix: Normal.     Uterus: Normal.      BP 120/78   LMP 03/13/2021  Wt Readings from Last 3 Encounters:  04/19/22 199 lb 6.4 oz (90.4 kg)  04/03/22 200 lb (90.7 kg)  03/20/22 201 lb 12.8 oz (91.5 kg)        Patient informed chaperone available to be present for breast and/or pelvic exam. Patient has requested no chaperone to be present. Patient has been advised what will be completed during breast and pelvic exam.   UA: trace leukocytes, negative nitrites, trace blood pH 5.5, yellow/clear. Microscopic: wbc none, rbc 0-2, bacteria none  Wet prep + clue cells (+ odor)  Assessment & Plan:   Problem List Items Addressed This Visit   None Visit Diagnoses     Bacterial vaginosis    -  Primary   Relevant Medications   metroNIDAZOLE (FLAGYL) 500 MG tablet   Vaginal odor       Relevant Orders   WET PREP FOR TRICH, YEAST, CLUE   Pelvic pain in female       Relevant Orders   Urinalysis,Complete w/RFL Culture      Plan: Wet prep positive for clue cells - Flagyl 500 mg BID x 7 days. UA negative.      Tamela Gammon DNP, 2:33 PM 05/24/2022

## 2022-05-25 LAB — URINALYSIS, COMPLETE W/RFL CULTURE
Bacteria, UA: NONE SEEN /HPF
Bilirubin Urine: NEGATIVE
Glucose, UA: NEGATIVE
Hyaline Cast: NONE SEEN /LPF
Ketones, ur: NEGATIVE
Nitrites, Initial: NEGATIVE
Protein, ur: NEGATIVE
Specific Gravity, Urine: 1.006 (ref 1.001–1.035)
WBC, UA: NONE SEEN /HPF (ref 0–5)
pH: 5.5 (ref 5.0–8.0)

## 2022-05-25 LAB — URINE CULTURE
MICRO NUMBER:: 13500659
Result:: NO GROWTH
SPECIMEN QUALITY:: ADEQUATE

## 2022-05-25 LAB — CULTURE INDICATED

## 2022-06-04 ENCOUNTER — Encounter: Payer: Self-pay | Admitting: Family Medicine

## 2022-06-04 ENCOUNTER — Ambulatory Visit (INDEPENDENT_AMBULATORY_CARE_PROVIDER_SITE_OTHER): Payer: BC Managed Care – PPO | Admitting: Family Medicine

## 2022-06-04 VITALS — BP 118/88 | HR 86 | Temp 97.8°F | Resp 18 | Ht 61.5 in | Wt 190.6 lb

## 2022-06-04 DIAGNOSIS — F32A Depression, unspecified: Secondary | ICD-10-CM | POA: Diagnosis not present

## 2022-06-04 DIAGNOSIS — F419 Anxiety disorder, unspecified: Secondary | ICD-10-CM | POA: Diagnosis not present

## 2022-06-04 MED ORDER — ESCITALOPRAM OXALATE 10 MG PO TABS: 10.0000 mg | ORAL_TABLET | Freq: Every day | ORAL | 3 refills | Status: DC

## 2022-06-04 NOTE — Patient Instructions (Signed)

## 2022-06-04 NOTE — Progress Notes (Addendum)
Subjective:   By signing my name below, I, [name], attest that this documentation has been prepared under the direction and in the presence of [name] DO 06/04/2022   Patient ID: Savannah Cook, female    DOB: 1966-04-06, 56 y.o.   MRN: [id number]  Chief Complaint  Patient presents with   Anxiety   Depression    HPI Patient is in today for an office visit  She states that she had an episode last year with anxiety and depression. She also started menopause last year. She went to her OBGYN and stated that she is feeling similar symptoms as last year. She mentions that she is feeling a bit worse as of recently. She has been having external stressors such as work, life. She currently lives alone and her dog passed away in 15-Dec-2021. She also has a support system which includes her daughter's father who lives 7-8 minutes away. She states at work she is usually outgoing but has withdrawn. She feels like the symptoms are related to menopause. She has previously taken anxiety medications but reports that the medication did not improve her symptoms. She took Lexapro for two weeks and then discontinued use. She has not been to counseling due to financial issues. She has tried to do counseling with [facility] and states that it did not work for her. She finds that resting her head helps but is impractical for her throughout the day. She does not feel like herself and feels disorientated. She has Afib and states that her medication has slightly improved her symptoms. She states that in the morning her anxiety is elevating. She walks as exercise.  Past Medical History:  Diagnosis Date   Anxiety    hx of, no medications now   Atrial fibrillation ([facility])    Atrial flutter ([facility])    Colon cancer ([place]) 06/10/2017   GERD (gastroesophageal reflux disease)    Hx of colonic polyp - sessile serrated 10/08/2018   Iron deficiency anemia due to chronic blood loss    colon cancer   Seasonal  allergies    Shingles Dec 15, 2021    Past Surgical History:  Procedure Laterality Date   CHOLECYSTECTOMY N/A 07/25/2017   Procedure: CHOLECYSTECTOMY;  Surgeon: Jackolyn Confer, MD;  Location: [facility];  Service: General;  Laterality: N/A;   COLONOSCOPY  06/10/2018   w/Dr.Gessner    LAPAROSCOPIC PARTIAL COLECTOMY N/A 07/25/2017   Procedure: LAPAROSCOPIC PARTIAL COLECTOMY;  Surgeon: Jackolyn Confer, MD;  Location: [facility];  Service: General;  Laterality: N/A;   UPPER GASTROINTESTINAL ENDOSCOPY  06/10/2018   WISDOM TOOTH EXTRACTION     WRIST SURGERY Right 04/2012    Family History  Problem Relation Age of Onset   Diabetes Mother    Hypertension Mother    Bipolar disorder Mother    COPD Mother    Diabetes Father    Hypertension Father    Lung cancer Father    Heart disease Maternal Grandmother    Colon cancer Maternal Grandfather 95   Colon polyps Neg Hx    Esophageal cancer Neg Hx    Rectal cancer Neg Hx    Stomach cancer Neg Hx     Social History   Socioeconomic History   Marital status: Significant Other    Spouse name: Not on file   Number of children: 1   Years of education: Not on file   Highest education level: Not on file  Occupational History   Occupation: Publishing copy, Glass blower/designer  apartments  Tobacco Use   Smoking status: Former    Types: Cigarettes    Quit date: 12/17/1985    Years since quitting: 36.4   Smokeless tobacco: Never   Tobacco comments:    Former smoker 04/19/22  Vaping Use   Vaping Use: Never used  Substance and Sexual Activity   Alcohol use: Yes    Alcohol/week: 1.0 standard drink of alcohol    Types: 1 Standard drinks or equivalent per week    Comment: Rare   Drug use: No   Sexual activity: Yes    Birth control/protection: Post-menopausal    Comment: INTERCOURSE AGE UNKOWN , SEXUAL PARTNERS LESS THAN 5  Other Topics Concern   Not on file  Social History Narrative   The patient is single, used to live w/ a partner until 11-2020   One daughter    is employed as a Publishing copy   2 caffeinated beverages a day   Social Determinants of Radio broadcast assistant Strain: Not on file  Food Insecurity: Not on file  Transportation Needs: Not on file  Physical Activity: Not on file  Stress: Not on file  Social Connections: Not on file  Intimate Partner Violence: Not on file    Outpatient Medications Prior to Visit  Medication Sig Dispense Refill   Cholecalciferol (VITAMIN D3 PO) Take 2,000 mg by mouth daily.     CINNAMON PO Take 1,000 mg by mouth every other day.     diltiazem (CARDIZEM CD) 120 MG 24 hr capsule Take 1 capsule (120 mg total) by mouth daily. 30 capsule 3   ELDERBERRY PO Take by mouth. Every day  2000 mg     fluticasone (FLONASE) 50 MCG/ACT nasal spray Place 2 sprays into both nostrils as needed for allergies or rhinitis.     metroNIDAZOLE (FLAGYL) 500 MG tablet Take 1 tablet (500 mg total) by mouth 2 (two) times daily. 14 tablet 0   Multiple Vitamin (MULTIVITAMIN ADULT PO) Take 1 tablet by mouth every morning. One daily- Every Woman's 55 plus- [place]     OVER THE COUNTER MEDICATION Take 1 tablet by mouth every morning. Cataplex C     cetirizine (ZYRTEC) 10 MG tablet Take 1 tablet (10 mg total) by mouth daily as needed for allergies. (Patient not taking: Reported on 05/24/2022) 90 tablet 3   clindamycin (CLEOCIN T) 1 % lotion Apply topically as needed. (Patient not taking: Reported on 05/24/2022)     No facility-administered medications prior to visit.    Allergies  Allergen Reactions   Ceclor [Cefaclor] Rash    Review of Systems  Constitutional:  Negative for fever and malaise/fatigue.  HENT:  Negative for congestion.   Eyes:  Negative for blurred vision.  Respiratory:  Negative for shortness of breath.   Cardiovascular:  Negative for chest pain, palpitations and leg swelling.  Gastrointestinal:  Negative for abdominal pain, blood in stool and nausea.  Genitourinary:  Negative for dysuria and  frequency.  Musculoskeletal:  Negative for falls.  Skin:  Negative for rash.  Neurological:  Negative for dizziness, loss of consciousness and headaches.  Endo/Heme/Allergies:  Negative for environmental allergies.  Psychiatric/Behavioral:  Positive for depression. The patient is nervous/anxious.        Objective:    Physical Exam Vitals and nursing note reviewed.  Constitutional:      General: She is not in acute distress.    Appearance: Normal appearance. She is well-developed. She is not ill-appearing.  HENT:  Head: Normocephalic and atraumatic.     Right Ear: External ear normal.     Left Ear: External ear normal.  Eyes:     Extraocular Movements: Extraocular movements intact.     Conjunctiva/sclera: Conjunctivae normal.     Pupils: Pupils are equal, round, and reactive to light.  Neck:     Thyroid: No thyromegaly.     Vascular: No carotid bruit or JVD.  Cardiovascular:     Rate and Rhythm: Normal rate and regular rhythm.     Heart sounds: Normal heart sounds. No murmur heard.    No gallop.  Pulmonary:     Effort: Pulmonary effort is normal. No respiratory distress.     Breath sounds: Normal breath sounds. No wheezing or rales.  Chest:     Chest wall: No tenderness.  Musculoskeletal:     Cervical back: Normal range of motion and neck supple.  Skin:    General: Skin is warm and dry.  Neurological:     Mental Status: She is alert and oriented to person, place, and time.  Psychiatric:        Attention and Perception: Attention normal.        Mood and Affect: Mood is anxious and depressed.        Speech: Speech is rapid and pressured.        Behavior: Behavior normal. Behavior is cooperative.        Thought Content: Thought content normal. Thought content does not include homicidal or suicidal ideation. Thought content does not include homicidal or suicidal plan.        Cognition and Memory: Cognition and memory normal.        Judgment: Judgment normal.     BP  118/88 (BP Location: Left Arm, Patient Position: Sitting, Cuff Size: Large)   Pulse 86   Temp 97.8 F (36.6 C) (Oral)   Resp 18   Ht 5' 1.5" (1.562 m)   Wt 190 lb 9.6 oz (86.5 kg)   LMP 03/13/2021   SpO2 97%   BMI 35.43 kg/m  Wt Readings from Last 3 Encounters:  06/04/22 190 lb 9.6 oz (86.5 kg)  04/19/22 199 lb 6.4 oz (90.4 kg)  04/03/22 200 lb (90.7 kg)    Diabetic Foot Exam - Simple   No data filed    Lab Results  Component Value Date   WBC 5.8 01/31/2022   HGB 14.7 01/31/2022   HCT 44.0 01/31/2022   PLT 244.0 01/31/2022   GLUCOSE 101 (H) 01/31/2022   CHOL 210 (H) 01/31/2022   TRIG 107.0 01/31/2022   HDL 57.80 01/31/2022   LDLCALC 131 (H) 01/31/2022   ALT 28 01/31/2022   AST 22 01/31/2022   NA 140 01/31/2022   K 4.7 01/31/2022   CL 104 01/31/2022   CREATININE 0.77 01/31/2022   BUN 20 01/31/2022   CO2 30 01/31/2022   TSH 2.15 01/31/2022   HGBA1C 5.8 04/14/2020    Lab Results  Component Value Date   TSH 2.15 01/31/2022   Lab Results  Component Value Date   WBC 5.8 01/31/2022   HGB 14.7 01/31/2022   HCT 44.0 01/31/2022   MCV 90.4 01/31/2022   PLT 244.0 01/31/2022   Lab Results  Component Value Date   NA 140 01/31/2022   K 4.7 01/31/2022   CO2 30 01/31/2022   GLUCOSE 101 (H) 01/31/2022   BUN 20 01/31/2022   CREATININE 0.77 01/31/2022   BILITOT 0.5 01/31/2022   ALKPHOS 76 01/31/2022  AST 22 01/31/2022   ALT 28 01/31/2022   PROT 8.6 (H) 01/31/2022   ALBUMIN 4.6 01/31/2022   CALCIUM 9.7 01/31/2022   ANIONGAP 8 07/26/2017   GFR 85.62 01/31/2022   Lab Results  Component Value Date   CHOL 210 (H) 01/31/2022   Lab Results  Component Value Date   HDL 57.80 01/31/2022   Lab Results  Component Value Date   LDLCALC 131 (H) 01/31/2022   Lab Results  Component Value Date   TRIG 107.0 01/31/2022   Lab Results  Component Value Date   CHOLHDL 4 01/31/2022   Lab Results  Component Value Date   HGBA1C 5.8 04/14/2020       Assessment &  Plan:   Problem List Items Addressed This Visit       Unprioritized   Anxiety and depression - Primary   Relevant Medications   escitalopram (LEXAPRO) 10 MG tablet   Other Relevant Orders   Ambulatory referral to Psychology  D/w pt that is important to go for counseling ----- d/w pt online options and gave her the list of counselors  Also d/w [facility] [address] [facility] -- she can walk in there  She will f/u with pcp in 1 month or sooner prn  She will take lexapro 10 mg 1/2 tab for 1-2 weeks then go to 1 qd    Meds ordered this encounter  Medications   escitalopram (LEXAPRO) 10 MG tablet    Sig: Take 1 tablet (10 mg total) by mouth daily.    Dispense:  30 tablet    Refill:  3    I, [name], DO, personally preformed the services described in this documentation.  All medical record entries made by the scribe were at my direction and in my presence.  I have reviewed the chart and discharge instructions (if applicable) and agree that the record reflects my personal performance and is accurate and complete. 06/04/2022   I,Amber Collins,acting as a scribe for [employer], DO.,have documented all relevant documentation on the behalf of [name], DO,as directed by  [name], DO while in the presence of [name], DO.  [name], DO

## 2022-06-22 ENCOUNTER — Ambulatory Visit (INDEPENDENT_AMBULATORY_CARE_PROVIDER_SITE_OTHER): Payer: BC Managed Care – PPO | Admitting: Cardiology

## 2022-06-22 ENCOUNTER — Encounter (HOSPITAL_BASED_OUTPATIENT_CLINIC_OR_DEPARTMENT_OTHER): Payer: Self-pay | Admitting: Cardiology

## 2022-06-22 VITALS — BP 120/70 | HR 84 | Ht 61.5 in | Wt 189.7 lb

## 2022-06-22 DIAGNOSIS — I471 Supraventricular tachycardia: Secondary | ICD-10-CM

## 2022-06-22 DIAGNOSIS — Z7189 Other specified counseling: Secondary | ICD-10-CM | POA: Diagnosis not present

## 2022-06-22 DIAGNOSIS — I4892 Unspecified atrial flutter: Secondary | ICD-10-CM

## 2022-06-22 DIAGNOSIS — I480 Paroxysmal atrial fibrillation: Secondary | ICD-10-CM | POA: Diagnosis not present

## 2022-06-22 MED ORDER — DILTIAZEM HCL ER COATED BEADS 120 MG PO CP24: 120.0000 mg | ORAL_CAPSULE | Freq: Every day | ORAL | 3 refills | Status: DC

## 2022-06-22 NOTE — Progress Notes (Signed)
Cardiology Office Note:    Date:  06/22/2022   ID:  LANE KJOS, DOB [birth year], MRN [id number]  PCP:  Colon Branch, MD  Cardiologist:  Buford Dresser, MD  Referring MD: Sherran Needs, NP   No chief complaint on file.   History of Present Illness:    Savannah Cook is a 56 y.o. female with a hx of atrial fibrillation, atrial flutter, GERD, colon cancer, iron deficiency anemia, and shingles, who is seen as a new consult at the request of Sherran Needs, NP for the evaluation and management of paroxysmal atrial fibrillation.  She saw Roderic Palau, NP on 04/19/2022 where she was in sinus rhythm. She reported a single brief of episode of Afib one day prior. Since her CHA2DS2VASc score is 1 and her Afib was minimal, she could stop taking Eliquis. She obtained a Kardia device to help monitor for arrhythmias with her phone. A sleep study had been ordered but denied by her insurance.   Cardiovascular risk factors: Prior clinical ASCVD:  Comorbid conditions:  Metabolic syndrome/Obesity: Chronic inflammatory conditions: Tobacco use history: She was a former smoker. She quit in 1987. Family history: Both of her parents had diabetes. Her mother was positive for bipolar disorder. Her mother was positive for hypertension.  Prior pertinent testing and/or incidental findings: Exercise level: Current diet:   Since mid May-June, she has been experiencing "a lot" of anxiety and depression, with associated tension headaches. This has been ongoing since last year but had improved at one point. When she went to the beach in June, she was feeling lethargic and felt worse. She also attributes her recent depression to overwhelming life stressors, possibly starting with her diagnosis of Afib.   She has been taking Tylenol during the day before work to help with racing heart rates/anxiety. She believes her heart racing could be mainly due to anxiety but worries about being in Afib.   She complains  of tension headaches where she has to lie down. She often has trouble sleeping due to her anxiety and has been taking melatonin.   Around July 4th she was experiencing hot flashes but this has improved. She usually takes her supplements in the morning.  She denies any palpitations, chest pain, shortness of breath, or peripheral edema. No lightheadedness, syncope, orthopnea, or PND.   Past Medical History:  Diagnosis Date   Anxiety    hx of, no medications now   Atrial fibrillation (HCC)    Atrial flutter ([place])    Colon cancer ([place]) 06/10/2017   GERD (gastroesophageal reflux disease)    Hx of colonic polyp - sessile serrated 10/08/2018   Iron deficiency anemia due to chronic blood loss    colon cancer   Seasonal allergies    Shingles 11/2021    Past Surgical History:  Procedure Laterality Date   CHOLECYSTECTOMY N/A 07/25/2017   Procedure: CHOLECYSTECTOMY;  Surgeon: Jackolyn Confer, MD;  Location: [facility];  Service: General;  Laterality: N/A;   COLONOSCOPY  06/10/2018   w/Dr.Gessner    LAPAROSCOPIC PARTIAL COLECTOMY N/A 07/25/2017   Procedure: LAPAROSCOPIC PARTIAL COLECTOMY;  Surgeon: Jackolyn Confer, MD;  Location: [facility];  Service: General;  Laterality: N/A;   UPPER GASTROINTESTINAL ENDOSCOPY  06/10/2018   WISDOM TOOTH EXTRACTION     WRIST SURGERY Right 04/2012    Current Medications: Current Outpatient Medications on File Prior to Visit  Medication Sig   Cholecalciferol (VITAMIN D3 PO) Take 2,000 mg by mouth daily.   CINNAMON PO  Take 1,000 mg by mouth every other day.   diltiazem (CARDIZEM CD) 120 MG 24 hr capsule Take 1 capsule (120 mg total) by mouth daily.   ELDERBERRY PO Take by mouth. Every day  2000 mg   escitalopram (LEXAPRO) 10 MG tablet Take 1 tablet (10 mg total) by mouth daily.   fluticasone (FLONASE) 50 MCG/ACT nasal spray Place 2 sprays into both nostrils as needed for allergies or rhinitis.   Multiple Vitamin (MULTIVITAMIN ADULT PO) Take 1 tablet by mouth every  morning. One daily- Every Woman's 55 plus- [place]   OVER THE COUNTER MEDICATION Take 1 tablet by mouth every morning. Cataplex C   No current facility-administered medications on file prior to visit.     Allergies:   Ceclor [cefaclor]   Social History   Tobacco Use   Smoking status: Former    Types: Cigarettes    Quit date: 12/17/1985    Years since quitting: 36.5   Smokeless tobacco: Never   Tobacco comments:    Former smoker 04/19/22  Vaping Use   Vaping Use: Never used  Substance Use Topics   Alcohol use: Yes    Alcohol/week: 1.0 standard drink of alcohol    Types: 1 Standard drinks or equivalent per week    Comment: Rare   Drug use: No    Family History: family history includes Bipolar disorder in her mother; COPD in her mother; Colon cancer (age of onset: 67) in her maternal grandfather; Diabetes in her father and mother; Heart disease in her maternal grandmother; Hypertension in her father and mother; Lung cancer in her father. There is no history of Colon polyps, Esophageal cancer, Rectal cancer, or Stomach cancer.  ROS:   Please see the history of present illness.  Additional pertinent ROS: Constitutional: Negative for chills, fever, night sweats, unintentional weight loss. Positive for hot flashes. HENT: Negative for ear pain and hearing loss.  Positive for tension headaches. Eyes: Negative for loss of vision and eye pain.  Respiratory: Negative for cough, sputum, wheezing.   Cardiovascular: See HPI. Gastrointestinal: Negative for abdominal pain, melena, and hematochezia.  Genitourinary: Negative for dysuria and hematuria.  Musculoskeletal: Negative for falls and myalgias.  Skin: Negative for itching and rash.  Neurological: Negative for focal weakness, focal sensory changes and loss of consciousness. Positive for stress/anxiety, depression, insomnia. Endo/Heme/Allergies: Does not bruise/bleed easily.     EKGs/Labs/Other Studies Reviewed:    The following  studies were reviewed today:  Monitor 02/2022 Sinus rhythm including sinus bradycardia and sinus tachycardia. She has multiple episodes of paroxysmal atrial flutter. Her atrial flutter is frequently associated with rapid ventricular response. Her longest episode of atrial flutter was 6 hours and 20 minutes with an average rate of 109 bpm. The patient's symptoms are associated with atrial flutter. Rare episodes of supraventricular tachycardia lasting up to 21 seconds. This also might have been an episode of atrial flutter.   Patch Wear Time:  9 days and 15 hours (2023-02-20T16:03:10-499 to 2023-03-02T07:22:42-0500)   Patient had a min HR of 45 bpm, max HR of 210 bpm, and avg HR of 78 bpm. Predominant underlying rhythm was Sinus Rhythm. 175 Supraventricular Tachycardia runs occurred, the run with the fastest interval lasting 7 beats with a max rate of 210 bpm, the  longest lasting 21.7 secs with an avg rate of 147 bpm. Atrial Flutter occurred (7% burden), ranging from 69-203 bpm (avg of 127 bpm), the longest lasting 6 hours 20 mins with an avg rate of 109  bpm. Atrial Flutter was detected within +/- 45 seconds of  symptomatic patient event(s). Isolated SVEs were occasional (2.1%, 22741), SVE Couplets were rare (<1.0%, 1713), and SVE Triplets were rare (<1.0%, 236). Isolated VEs were rare (<1.0%, 112), VE Triplets were rare (<1.0%, 1), and no VE Couplets were  present.   Echo 02/21/2022:  1. Left ventricular ejection fraction, by estimation, is 60 to 65%. Left  ventricular ejection fraction by 3D volume is 64 %. The left ventricle has  normal function. The left ventricle has no regional wall motion  abnormalities. Left ventricular diastolic   parameters are consistent with Grade I diastolic dysfunction (impaired  relaxation). The average left ventricular global longitudinal strain is  -24.0 %. The global longitudinal strain is normal.   2. Right ventricular systolic function is normal. The right  ventricular  size is normal. There is normal pulmonary artery systolic pressure.   3. The mitral valve is normal in structure. No evidence of mitral valve  regurgitation. No evidence of mitral stenosis.   4. The aortic valve is tricuspid. Aortic valve regurgitation is not  visualized. No aortic stenosis is present.   5. The inferior vena cava is normal in size with greater than 50%  respiratory variability, suggesting right atrial pressure of 3 mmHg.   Comparison(s): No prior Echocardiogram.    EKG:  EKG is personally reviewed.   06/22/2022: ***  Recent Labs: 01/31/2022: ALT 28; BUN 20; Creatinine, Ser 0.77; Hemoglobin 14.7; Magnesium 2.2; Platelets 244.0; Potassium 4.7; Sodium 140; TSH 2.15  Recent Lipid Panel    Component Value Date/Time   CHOL 210 (H) 01/31/2022 0922   TRIG 107.0 01/31/2022 0922   HDL 57.80 01/31/2022 0922   CHOLHDL 4 01/31/2022 0922   VLDL 21.4 01/31/2022 0922   LDLCALC 131 (H) 01/31/2022 0922   LDLCALC 140 (H) 07/20/2019 0919    Physical Exam:    VS:  LMP 03/13/2021     Wt Readings from Last 3 Encounters:  06/04/22 190 lb 9.6 oz (86.5 kg)  04/19/22 199 lb 6.4 oz (90.4 kg)  04/03/22 200 lb (90.7 kg)    GEN: Well nourished, well developed in no acute distress HEENT: Normal, moist mucous membranes NECK: No JVD CARDIAC: regular rhythm, normal S1 and S2, no rubs or gallops. No murmur. VASCULAR: Radial and DP pulses 2+ bilaterally. No carotid bruits RESPIRATORY:  Clear to auscultation without rales, wheezing or rhonchi  ABDOMEN: Soft, non-tender, non-distended MUSCULOSKELETAL:  Ambulates independently SKIN: Warm and dry, no edema NEUROLOGIC:  Alert and oriented x 3. No focal neuro deficits noted. PSYCHIATRIC:  Normal affect    ASSESSMENT:    No diagnosis found. PLAN:     Cardiac risk counseling and prevention recommendations: -recommend heart healthy/Mediterranean diet, with whole grains, fruits, vegetable, fish, lean meats, nuts, and olive oil.  Limit salt. -recommend moderate walking, 3-5 times/week for 30-50 minutes each session. Aim for at least 150 minutes.week. Goal should be pace of 3 miles/hours, or walking 1.5 miles in 30 minutes -recommend avoidance of tobacco products. Avoid excess alcohol. -ASCVD risk score: The 10-year ASCVD risk score (Arnett DK, et al., 2019) is: 2.1%   Values used to calculate the score:     Age: 8 years     Sex: Female     Is Non-Hispanic African American: No     Diabetic: No     Tobacco smoker: No     Systolic Blood Pressure: 626 mmHg     Is BP treated: No  HDL Cholesterol: 57.8 mg/dL     Total Cholesterol: 210 mg/dL    Plan for follow up: 1 year or sooner as needed  Buford Dresser, MD, PhD, [facility]    Medication Adjustments/Labs and Tests Ordered: Current medicines are reviewed at length with the patient today.  Concerns regarding medicines are outlined above.   Orders Placed This Encounter  Procedures   EKG 12-Lead   Meds ordered this encounter  Medications   diltiazem (CARDIZEM CD) 120 MG 24 hr capsule    Sig: Take 1 capsule (120 mg total) by mouth daily.    Dispense:  90 capsule    Refill:  3    D/c cardizem '30mg'$  order   Patient Instructions  Medication Instructions:  START: Diltiazem 120 mg daily  *If you need a refill on your cardiac medications before your next appointment, please call your pharmacy*   Lab Work: None ordered today   Testing/Procedures: None ordered today   Follow-Up: At [facility], you and your health needs are our priority.  As part of our continuing mission to provide you with exceptional heart care, we have created designated Provider Care Teams.  These Care Teams include your primary Cardiologist (physician) and Advanced Practice Providers (APPs -  Physician Assistants and Nurse Practitioners) who all work together to provide you with the care you need, when you need it.  We recommend signing up for the  patient portal called "MyChart".  Sign up information is provided on this After Visit Summary.  MyChart is used to connect with patients for Virtual Visits (Telemedicine).  Patients are able to view lab/test results, encounter notes, upcoming appointments, etc.  Non-urgent messages can be sent to your provider as well.   To learn more about what you can do with MyChart, go to [email or web address].    Your next appointment:   1 year(s)  The format for your next appointment:   In Person  Provider:   Buford Dresser, MD            I,Jenifer Velasquez,acting as a scribe for Buford Dresser, MD.,have documented all relevant documentation on the behalf of Buford Dresser, MD,as directed by  Buford Dresser, MD while in the presence of Buford Dresser, MD.  I, Buford Dresser, MD, have reviewed all documentation for this visit. The documentation on 08/07/22 for the exam, diagnosis, procedures, and orders are all accurate and complete.   Signed, Buford Dresser, MD PhD 06/22/2022 7:24 PM    [place]

## 2022-06-22 NOTE — Patient Instructions (Signed)
Medication Instructions:  START: Diltiazem 120 mg daily  *If you need a refill on your cardiac medications before your next appointment, please call your pharmacy*   Lab Work: None ordered today   Testing/Procedures: None ordered today   Follow-Up: At [facility], you and your health needs are our priority.  As part of our continuing mission to provide you with exceptional heart care, we have created designated Provider Care Teams.  These Care Teams include your primary Cardiologist (physician) and Advanced Practice Providers (APPs -  Physician Assistants and Nurse Practitioners) who all work together to provide you with the care you need, when you need it.  We recommend signing up for the patient portal called "MyChart".  Sign up information is provided on this After Visit Summary.  MyChart is used to connect with patients for Virtual Visits (Telemedicine).  Patients are able to view lab/test results, encounter notes, upcoming appointments, etc.  Non-urgent messages can be sent to your provider as well.   To learn more about what you can do with MyChart, go to [email or web address].    Your next appointment:   1 year(s)  The format for your next appointment:   In Person  Provider:   Buford Dresser, MD

## 2022-08-08 DIAGNOSIS — I471 Supraventricular tachycardia: Secondary | ICD-10-CM | POA: Insufficient documentation

## 2022-08-08 DIAGNOSIS — I480 Paroxysmal atrial fibrillation: Secondary | ICD-10-CM | POA: Insufficient documentation

## 2022-08-10 ENCOUNTER — Encounter: Payer: Self-pay | Admitting: Internal Medicine

## 2022-09-03 ENCOUNTER — Encounter: Payer: Self-pay | Admitting: Nurse Practitioner

## 2022-09-03 ENCOUNTER — Ambulatory Visit: Payer: BC Managed Care – PPO | Admitting: Nurse Practitioner

## 2022-09-03 VITALS — BP 92/68 | HR 81

## 2022-09-03 DIAGNOSIS — B9689 Other specified bacterial agents as the cause of diseases classified elsewhere: Secondary | ICD-10-CM

## 2022-09-03 DIAGNOSIS — N760 Acute vaginitis: Secondary | ICD-10-CM

## 2022-09-03 DIAGNOSIS — R300 Dysuria: Secondary | ICD-10-CM

## 2022-09-03 DIAGNOSIS — B3731 Acute candidiasis of vulva and vagina: Secondary | ICD-10-CM

## 2022-09-03 DIAGNOSIS — N9089 Other specified noninflammatory disorders of vulva and perineum: Secondary | ICD-10-CM

## 2022-09-03 LAB — URINALYSIS, COMPLETE W/RFL CULTURE
Bacteria, UA: NONE SEEN /HPF
Bilirubin Urine: NEGATIVE
Casts: NONE SEEN /LPF
Crystals: NONE SEEN /HPF
Glucose, UA: NEGATIVE
Hgb urine dipstick: NEGATIVE
Hyaline Cast: NONE SEEN /LPF
Leukocyte Esterase: NEGATIVE
Nitrites, Initial: NEGATIVE
Protein, ur: NEGATIVE
RBC / HPF: NONE SEEN /HPF (ref 0–2)
Specific Gravity, Urine: 1.025 (ref 1.001–1.035)
WBC, UA: NONE SEEN /HPF (ref 0–5)
Yeast: NONE SEEN /HPF
pH: 5 (ref 5.0–8.0)

## 2022-09-03 LAB — NO CULTURE INDICATED

## 2022-09-03 LAB — WET PREP FOR TRICH, YEAST, CLUE

## 2022-09-03 MED ORDER — METRONIDAZOLE 0.75 % VA GEL: 1.0000 | Freq: Every day | VAGINAL | 0 refills | Status: AC

## 2022-09-03 MED ORDER — FLUCONAZOLE 150 MG PO TABS: 150.0000 mg | ORAL_TABLET | ORAL | 0 refills | Status: DC

## 2022-09-03 NOTE — Progress Notes (Signed)
   Acute Office Visit  Subjective:    Patient ID: Savannah Cook, female    DOB: [birth year], 56 y.o.   MRN: [id number]   HPI 56 y.o. presents today for dysuria, urinary frequency and vulvovaginal irritation. She has been wearing bathing suits recently. She was sexually active just prior to this and used a new lubricant. She does feels she has vaginal dryness. No pain with intercourse.    Review of Systems  Constitutional: Negative.   Genitourinary:  Positive for dysuria, frequency and vaginal pain (Vulvovaginal irritation). Negative for difficulty urinating, dyspareunia, flank pain, genital sores, hematuria, urgency and vaginal discharge.       Objective:    Physical Exam Constitutional:      Appearance: Normal appearance.  Genitourinary:    Vagina: Normal.     Cervix: Normal.     Comments: Mild vulvar erythema    BP 92/68   Pulse 81   LMP 03/13/2021   SpO2 96%  Wt Readings from Last 3 Encounters:  06/22/22 189 lb 11.2 oz (86 kg)  06/04/22 190 lb 9.6 oz (86.5 kg)  04/19/22 199 lb 6.4 oz (90.4 kg)        Patient informed chaperone available to be present for breast and/or pelvic exam. Patient has requested no chaperone to be present. Patient has been advised what will be completed during breast and pelvic exam.   UA negative Wet prep   Assessment & Plan:   Problem List Items Addressed This Visit   None Visit Diagnoses     Vaginal candidiasis    -  Primary   Relevant Medications   fluconazole (DIFLUCAN) 150 MG tablet   Vulvar irritation       Relevant Orders   WET PREP FOR TRICH, YEAST, CLUE   Burning with urination       Relevant Orders   Urinalysis,Complete w/RFL Culture (Completed)   REFLEXIVE URINE CULTURE (Completed)   Bacterial vaginosis       Relevant Medications   fluconazole (DIFLUCAN) 150 MG tablet   metroNIDAZOLE (METROGEL) 0.75 % vaginal gel      Plan: Wet prep positive for clue cells and yeast. Diflucan 150 mg today and repeat in 3 days  for total of 2 doses. Metrogel 0.75% nightly x 5 nights. UA unremarkable. Recommend using coconut oil for lubrication and avoid sitting in wet bathing suits for extended periods.      Tamela Gammon DNP, 10:31 AM 09/03/2022

## 2022-09-07 DIAGNOSIS — Z1231 Encounter for screening mammogram for malignant neoplasm of breast: Secondary | ICD-10-CM | POA: Diagnosis not present

## 2022-09-10 ENCOUNTER — Encounter: Payer: Self-pay | Admitting: Nurse Practitioner

## 2022-09-19 ENCOUNTER — Telehealth: Payer: Self-pay

## 2022-09-19 ENCOUNTER — Encounter: Payer: Self-pay | Admitting: Nurse Practitioner

## 2022-09-19 ENCOUNTER — Other Ambulatory Visit: Payer: Self-pay | Admitting: Nurse Practitioner

## 2022-09-19 DIAGNOSIS — B3731 Acute candidiasis of vulva and vagina: Secondary | ICD-10-CM

## 2022-09-19 MED ORDER — FLUCONAZOLE 150 MG PO TABS: 150.0000 mg | ORAL_TABLET | Freq: Every day | ORAL | 0 refills | Status: DC

## 2022-09-19 NOTE — Telephone Encounter (Signed)
Patient said she was in a few weeks ago and asked Tiffany, NP about sending Rx for Diflucan just for her to have on hand in case she feels any itching/discharge. She said [place] used to do this for her.  She is calling today to see if Jonelle Sidle, NP will send that prescription in for her.

## 2022-09-19 NOTE — Telephone Encounter (Signed)
Spoke with patient. Prescription sent.

## 2022-10-01 IMAGING — DX DG CHEST 2V
2 series · 2 of 2 positions shown · non-contrast
Comparison: Chest CT 06/14/2017

CLINICAL DATA: Cough. Subacute cough. Patient reports coughing
since [REDACTED].

EXAM:
CHEST - 2 VIEW

[chest pa]
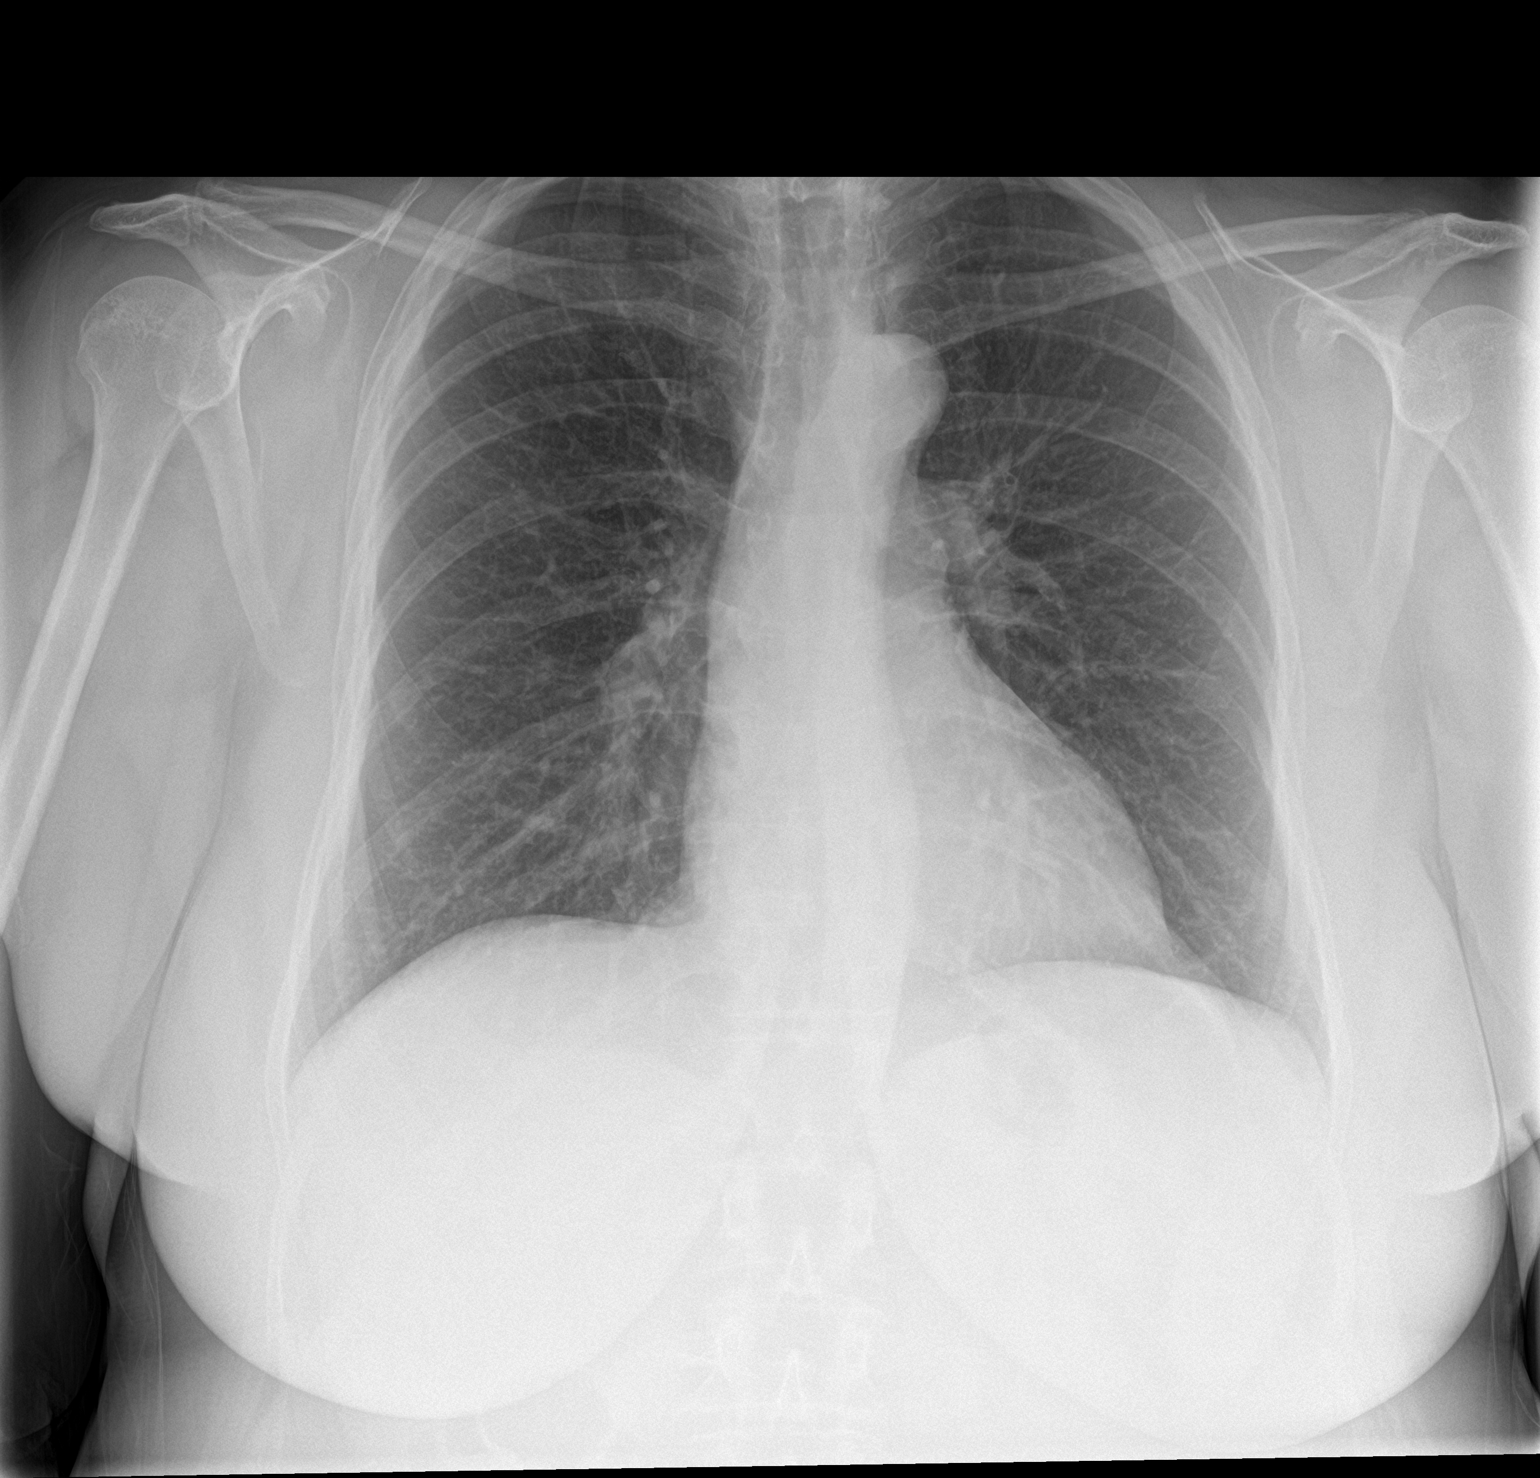

[chest lat]
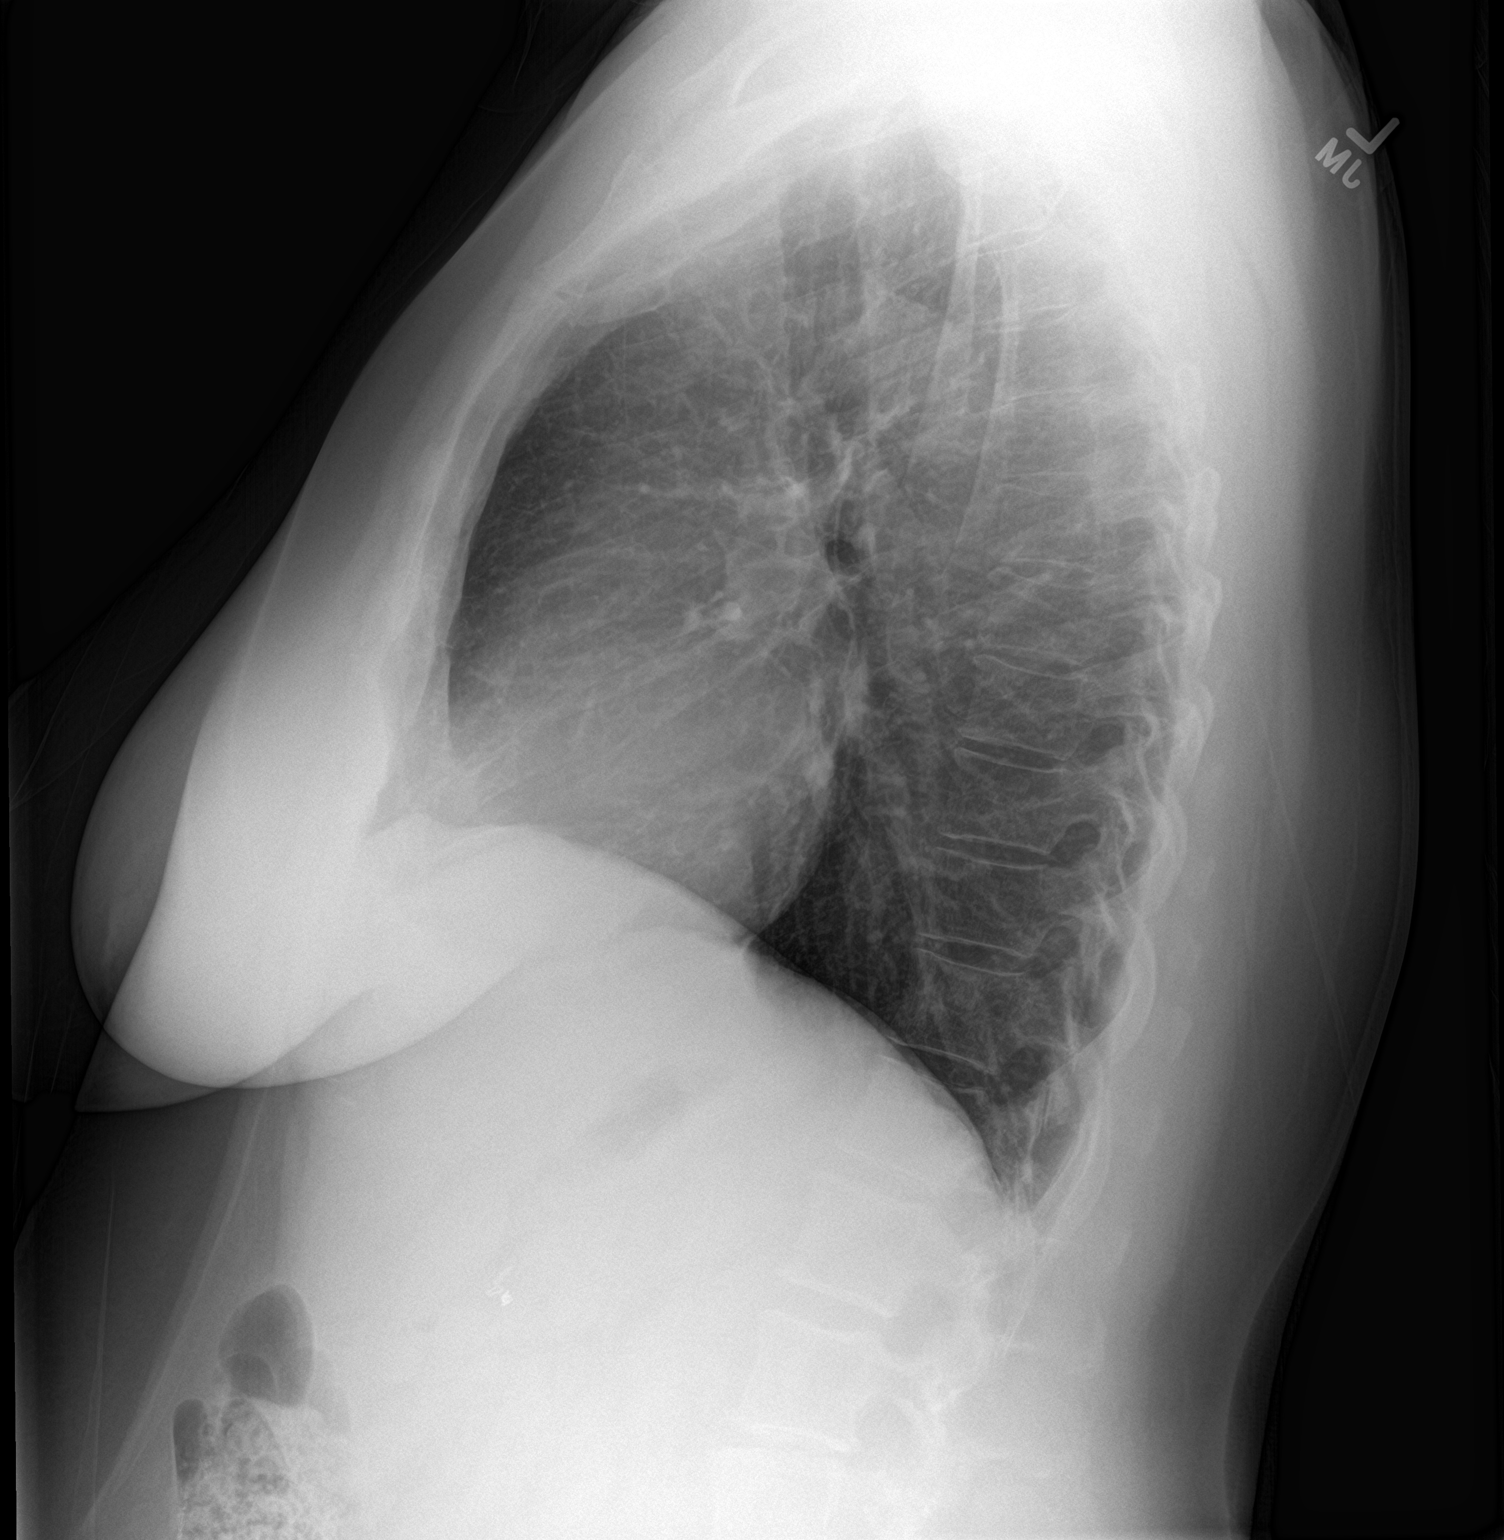

[2 of 2 positions shown; findings below may reference images not displayed]

FINDINGS: The cardiomediastinal contours are normal. The lungs are clear.
Pulmonary vasculature is normal. No consolidation, pleural effusion,
or pneumothorax. No radiographic evidence of pulmonary nodule, no
pulmonary mass. No acute osseous abnormalities are seen. Right upper
quadrant cholecystectomy clips.
IMPRESSION: No acute findings or explanation for cough.

## 2022-10-08 ENCOUNTER — Telehealth: Payer: Self-pay | Admitting: *Deleted

## 2022-10-08 NOTE — Telephone Encounter (Signed)
Patient called c/o bleeding, reports she has noticed light spotting,however she reports bleeding since last Wednesday reports it not heavy to the point of changing tampon every hour. The increase flow started over the weekend. I recommend she schedule an office visit for postmenopausal bleeding. Patient currently out of town, leaving out Wednesday earlier am, schedule for visit at noon on 10/10/22. Check at 11:45am.   Just FYI

## 2022-10-10 ENCOUNTER — Ambulatory Visit: Payer: BC Managed Care – PPO | Admitting: Nurse Practitioner

## 2022-10-10 VITALS — BP 112/80 | HR 77

## 2022-10-10 DIAGNOSIS — N950 Postmenopausal bleeding: Secondary | ICD-10-CM | POA: Diagnosis not present

## 2022-10-10 NOTE — Progress Notes (Signed)
   Acute Office Visit  Subjective:    Patient ID: Savannah Cook, female    DOB: [birth year], 56 y.o.   MRN: [id number]   HPI 56 y.o. presents today for postmenopausal bleeding. She had light spotting that started 10/03/2022 and then became heavier on 10/06/2022. Moderate bleeding lasted 2 days and then started to taper off. She is still having some light bleeding today. Felt like she had some PMS symptoms prior to bleeding. Prior to this her most recent cycle was in August 2022. No HRT. Last sexual intercourse a few weeks ago, partner has vasectomy.    Review of Systems  Constitutional: Negative.   Genitourinary:  Positive for menstrual problem and vaginal bleeding.       Objective:    Physical Exam Constitutional:      Appearance: Normal appearance.  Genitourinary:    General: Normal vulva.     Vagina: Bleeding present.     Cervix: Normal.     Uterus: Normal.      Adnexa: Right adnexa normal and left adnexa normal.     BP 112/80   Pulse 77   LMP 10/06/2022 (Exact Date) Comment: Last cycle was in 07/2021  SpO2 98%  Wt Readings from Last 3 Encounters:  06/22/22 189 lb 11.2 oz (86 kg)  06/04/22 190 lb 9.6 oz (86.5 kg)  04/19/22 199 lb 6.4 oz (90.4 kg)        Patient informed chaperone available to be present for breast and/or pelvic exam. Patient has requested no chaperone to be present. Patient has been advised what will be completed during breast and pelvic exam.   Assessment & Plan:   Problem List Items Addressed This Visit   None Visit Diagnoses     PMB (postmenopausal bleeding)    -  Primary   Relevant Orders   US PELVIS TRANSVAGINAL NON-OB (TV ONLY)      Plan: Will schedule pelvic ultrasound for further evaluation. She will reach out if bleeding becomes heavy or is prolonged.      Tamela Gammon DNP, 12:27 PM 10/10/2022

## 2022-10-11 ENCOUNTER — Ambulatory Visit (INDEPENDENT_AMBULATORY_CARE_PROVIDER_SITE_OTHER): Payer: BC Managed Care – PPO | Admitting: Nurse Practitioner

## 2022-10-11 ENCOUNTER — Ambulatory Visit (INDEPENDENT_AMBULATORY_CARE_PROVIDER_SITE_OTHER): Payer: BC Managed Care – PPO

## 2022-10-11 VITALS — BP 102/62 | HR 90

## 2022-10-11 DIAGNOSIS — N950 Postmenopausal bleeding: Secondary | ICD-10-CM | POA: Diagnosis not present

## 2022-10-11 DIAGNOSIS — R9389 Abnormal findings on diagnostic imaging of other specified body structures: Secondary | ICD-10-CM

## 2022-10-11 NOTE — Patient Instructions (Signed)
*  Borderline thickened endometrial lining*. Endometrial biopsy recommended.

## 2022-10-11 NOTE — Progress Notes (Addendum)
   Acute Office Visit  Subjective:    Patient ID: Savannah Cook, female    DOB: December 08, 1966, 56 y.o.   MRN: [id number]   HPI 56 y.o. presents today for pelvic ultrasound. Seen yesterday for PMB. She had light spotting that started 10/03/2022 and then became heavier on 10/06/2022. Moderate bleeding lasted 2 days and then started to taper off. She is still having some light bleeding today. Felt like she had some PMS symptoms prior to bleeding. Prior to this her most recent cycle was in August 2022. No HRT. Last sexual intercourse a few weeks ago, partner has vasectomy.    Review of Systems  Constitutional: Negative.   Genitourinary:  Positive for vaginal bleeding.       Objective:    Physical Exam Constitutional:      Appearance: Normal appearance.   GU: Not indicated  BP 102/62   Pulse 90   LMP 10/06/2022 (Exact Date) Comment: Last cycle was in 07/2021  SpO2 95%  Wt Readings from Last 3 Encounters:  06/22/22 189 lb 11.2 oz (86 kg)  06/04/22 190 lb 9.6 oz (86.5 kg)  04/19/22 199 lb 6.4 oz (90.4 kg)        Assessment & Plan:   Problem List Items Addressed This Visit   None Visit Diagnoses     PMB (postmenopausal bleeding)    -  Primary   Relevant Orders   Endometrial biopsy   Thickened endometrium       Relevant Orders   Endometrial biopsy      Vaginal ultrasound:  Anteverted uterus, normal size and shape, inhomogeneous myometrium with cystic changes and shadowing suggestive of adenomyosis. No myometrial masses. Endometrium with small amount of debris (patient bleeding) - 5.1 mm. No obvious masses or abnormal blood flow seen. Left ovary small with atrophic appearance. Right ovary with a 26 x 22 mm simple follicle. No adnexal masses, no free fluid.   Plan: Ultrasound thoroughly reviewed with patient. Discussed nature of postmenopausal bleeding and borderline thickened endometrium. EMB recommended and she is agreeable. Bleeding has slowed, so no intervention needed at  this time.      [place], 11:59 AM 10/11/2022

## 2022-10-22 ENCOUNTER — Ambulatory Visit: Payer: BC Managed Care – PPO | Admitting: Obstetrics and Gynecology

## 2022-11-15 ENCOUNTER — Encounter: Payer: Self-pay | Admitting: Nurse Practitioner

## 2022-11-16 NOTE — Telephone Encounter (Signed)
She really needs EMB sooner rather than later, if she insists on waiting until January please schedule for the first week of January

## 2022-11-19 NOTE — Telephone Encounter (Signed)
FYI.   "Appointment is w/Dr Talbert Nan on 12/28/2022 '@1'$ :30. Patient has been informed."

## 2022-12-12 ENCOUNTER — Telehealth: Payer: Self-pay | Admitting: *Deleted

## 2022-12-12 NOTE — Telephone Encounter (Signed)
Patient scheduled for endo biopsy 12/28/22 with Dr.Jertson, patient asked if she could have repeat ultrasound? Report she has not had any more bleeding and the co-pay cost is expensive for her right now. She wanted to know if this would be an option? Please advise

## 2022-12-13 NOTE — Telephone Encounter (Signed)
Patient informed, she will keep scheduled appointment.

## 2022-12-21 ENCOUNTER — Ambulatory Visit: Payer: BC Managed Care – PPO | Admitting: Nurse Practitioner

## 2022-12-21 ENCOUNTER — Encounter: Payer: Self-pay | Admitting: Nurse Practitioner

## 2022-12-21 VITALS — BP 120/78 | HR 100

## 2022-12-21 DIAGNOSIS — Z8742 Personal history of other diseases of the female genital tract: Secondary | ICD-10-CM

## 2022-12-21 DIAGNOSIS — R350 Frequency of micturition: Secondary | ICD-10-CM

## 2022-12-21 DIAGNOSIS — N950 Postmenopausal bleeding: Secondary | ICD-10-CM

## 2022-12-21 DIAGNOSIS — R9389 Abnormal findings on diagnostic imaging of other specified body structures: Secondary | ICD-10-CM

## 2022-12-21 LAB — URINALYSIS, COMPLETE W/RFL CULTURE
Bacteria, UA: NONE SEEN /HPF
Bilirubin Urine: NEGATIVE
Glucose, UA: NEGATIVE
Hyaline Cast: NONE SEEN /LPF
Ketones, ur: NEGATIVE
Leukocyte Esterase: NEGATIVE
Nitrites, Initial: NEGATIVE
Protein, ur: NEGATIVE
RBC / HPF: NONE SEEN /HPF (ref 0–2)
Specific Gravity, Urine: 1.015 (ref 1.001–1.035)
WBC, UA: NONE SEEN /HPF (ref 0–5)
pH: 7 (ref 5.0–8.0)

## 2022-12-21 LAB — WET PREP FOR TRICH, YEAST, CLUE

## 2022-12-21 LAB — NO CULTURE INDICATED

## 2022-12-21 NOTE — Progress Notes (Signed)
   Acute Office Visit  Subjective:    Patient ID: Savannah Cook, female    DOB: 03-Mar-1966, 57 y.o.   MRN: [id number]   HPI 57 y.o. presents today for frequent urination. H/O recurrent yeast and BV and wants to be checked for these since she has been asymptomatic with them in the past. Seen 10/10/2022 with complaints of vaginal bleeding. LMP prior to that was 07/2021. U/S 10/11/22 showed borderline endo thickness of 5.1 mm. Also had menstrual-like bleeding in November 2023 that lasted 5 days. Due to costs she is wanting to repeat Ultrasound first since she was still bleeding when she had her last one.    Review of Systems  Constitutional: Negative.   Genitourinary:  Positive for frequency. Negative for difficulty urinating, dysuria, flank pain, genital sores, hematuria, urgency, vaginal bleeding, vaginal discharge and vaginal pain.       Objective:    Physical Exam Constitutional:      Appearance: Normal appearance.  Genitourinary:    General: Normal vulva.     Vagina: Normal.     Cervix: Normal.     Uterus: Normal.      BP 120/78 (BP Location: Left Arm, Patient Position: Sitting, Cuff Size: Large)   Pulse 100   LMP 10/06/2022 (Exact Date) Comment: Last cycle was in 07/2021  SpO2 98%  Wt Readings from Last 3 Encounters:  06/22/22 189 lb 11.2 oz (86 kg)  06/04/22 190 lb 9.6 oz (86.5 kg)  04/19/22 199 lb 6.4 oz (90.4 kg)        Patient informed chaperone available to be present for breast and/or pelvic exam. Patient has requested no chaperone to be present. Patient has been advised what will be completed during breast and pelvic exam.   UA negative Wet prep negative  Assessment & Plan:   Problem List Items Addressed This Visit   None Visit Diagnoses     Urinary frequency    -  Primary   Relevant Orders   Urinalysis,Complete w/RFL Culture (Completed)   Thickened endometrium       Relevant Orders   US PELVIS TRANSVAGINAL NON-OB (TV ONLY)   PMB (postmenopausal  bleeding)       Relevant Orders   US PELVIS TRANSVAGINAL NON-OB (TV ONLY)   History of vaginitis       Relevant Orders   WET PREP FOR TRICH, YEAST, CLUE      Plan: Negative wet prep, UA and exam today. Discussed recommendation for EMB but due to concern of costs and bleeding at the time I am agreeable to recheck lining. She is aware and agrees if still thickened she needs EMB.      Tamela Gammon DNP, 3:07 PM 12/21/2022

## 2022-12-27 ENCOUNTER — Ambulatory Visit (INDEPENDENT_AMBULATORY_CARE_PROVIDER_SITE_OTHER): Payer: BC Managed Care – PPO | Admitting: Nurse Practitioner

## 2022-12-27 ENCOUNTER — Encounter: Payer: Self-pay | Admitting: Nurse Practitioner

## 2022-12-27 ENCOUNTER — Ambulatory Visit (INDEPENDENT_AMBULATORY_CARE_PROVIDER_SITE_OTHER): Payer: BC Managed Care – PPO

## 2022-12-27 VITALS — BP 124/80

## 2022-12-27 DIAGNOSIS — N950 Postmenopausal bleeding: Secondary | ICD-10-CM

## 2022-12-27 DIAGNOSIS — R9389 Abnormal findings on diagnostic imaging of other specified body structures: Secondary | ICD-10-CM | POA: Diagnosis not present

## 2022-12-27 DIAGNOSIS — B009 Herpesviral infection, unspecified: Secondary | ICD-10-CM | POA: Diagnosis not present

## 2022-12-27 MED ORDER — VALACYCLOVIR HCL 1 G PO TABS: 1000.0000 mg | ORAL_TABLET | Freq: Two times a day (BID) | ORAL | 1 refills | Status: DC

## 2022-12-27 NOTE — Progress Notes (Signed)
   Acute Office Visit  Subjective:    Patient ID: Savannah Cook, female    DOB: 07-Aug-1966, 57 y.o.   MRN: [id number]   HPI 57 y.o. presents today for ultrasound.  Seen 10/10/2022 with complaints of vaginal bleeding. LMP prior to that was 07/2021. U/S 10/11/22 showed borderline endo thickness of 5.1 mm. Also had menstrual-like bleeding in November 2023 that lasted 5 days. Due to costs she is wanting to repeat ultrasound first since she was still bleeding when she had her last one. She thinks she has a current HSV outbreak. Had some old Valtrex at home that she took.    Review of Systems  Constitutional: Negative.   Genitourinary:  Positive for genital sores. Negative for vaginal bleeding.       Objective:    Physical Exam Constitutional:      Appearance: Normal appearance.   GU: Not indicated  BP 124/80 (BP Location: Right Arm, Patient Position: Sitting, Cuff Size: Large)   LMP 10/06/2022 (Exact Date) Comment: Last cycle was in 07/2021 Wt Readings from Last 3 Encounters:  06/22/22 189 lb 11.2 oz (86 kg)  06/04/22 190 lb 9.6 oz (86.5 kg)  04/19/22 199 lb 6.4 oz (90.4 kg)        Assessment & Plan:   Problem List Items Addressed This Visit   None Visit Diagnoses     PMB (postmenopausal bleeding)    -  Primary   HSV (herpes simplex virus) infection       Relevant Medications   valACYclovir (VALTREX) 1000 MG tablet      Vaginal ultrasound: Anteverted uterus, inhomogeneous myometrium noted again, no myometrial masses. Symmetrical endometrium is thinner than previous scan - 3.4 mm (5.1 previously). No masses or thickening or abnormal blood flow seen. Bother ovaries small with atrophic appearance. Right ovarian follicle previously seen is no longer visible. No adnexal masses or free fluid.   Plan: Thin endometrial lining on ultrasound today. Will cancel EMB appointment for tomorrow. Will continue to monitor bleeding. Valtrex refill provided.      Tamela Gammon DNP,  12:26 PM 12/27/2022

## 2022-12-28 ENCOUNTER — Ambulatory Visit: Payer: BC Managed Care – PPO | Admitting: Obstetrics and Gynecology

## 2023-01-04 DIAGNOSIS — M5459 Other low back pain: Secondary | ICD-10-CM | POA: Diagnosis not present

## 2023-01-04 DIAGNOSIS — M25552 Pain in left hip: Secondary | ICD-10-CM | POA: Diagnosis not present

## 2023-01-24 ENCOUNTER — Encounter (HOSPITAL_COMMUNITY): Payer: Self-pay | Admitting: *Deleted

## 2023-03-08 ENCOUNTER — Other Ambulatory Visit: Payer: Self-pay | Admitting: Internal Medicine

## 2023-03-08 ENCOUNTER — Telehealth: Payer: Self-pay | Admitting: Internal Medicine

## 2023-03-08 DIAGNOSIS — M65331 Trigger finger, right middle finger: Secondary | ICD-10-CM | POA: Diagnosis not present

## 2023-03-08 NOTE — Telephone Encounter (Signed)
Patient requested a call back to discuss the need for an appointment since it's just for allergies. Informed patient that she had not been here for a year and that's why the medication was denied. Patient wanted a medication routed to Dr. Larose Kells explaining that she did not feel like she needed to come in for an OV just to get a refill on her allergy medication that she never got refills on. Please advise.

## 2023-03-08 NOTE — Telephone Encounter (Signed)
Prescription Request  03/08/2023  Is this a "Controlled Substance" medicine? No  LOV: Visit date not found  What is the name of the medication or equipment?   cetirizine (ZYRTEC) 10 MG tablet [id number]  DISCONTINUED   Have you contacted your pharmacy to request a refill? No   Which pharmacy would you like this sent to?  [id number] - [place], [place] - [address] [place]  [address] Phone: [phone] Fax: [phone]    Patient notified that their request is being sent to the clinical staff for review and that they should receive a response within 2 business days.   Please advise at Mobile [phone] (mobile)

## 2023-03-08 NOTE — Telephone Encounter (Signed)
Needs appt- last ov 02/2022 w/ PCP

## 2023-03-09 ENCOUNTER — Other Ambulatory Visit: Payer: Self-pay | Admitting: Internal Medicine

## 2023-03-29 ENCOUNTER — Encounter: Payer: Self-pay | Admitting: Internal Medicine

## 2023-04-04 ENCOUNTER — Encounter: Payer: Self-pay | Admitting: Nurse Practitioner

## 2023-04-08 ENCOUNTER — Ambulatory Visit: Payer: BC Managed Care – PPO | Admitting: Nurse Practitioner

## 2023-04-10 ENCOUNTER — Ambulatory Visit (INDEPENDENT_AMBULATORY_CARE_PROVIDER_SITE_OTHER): Payer: BC Managed Care – PPO | Admitting: Nurse Practitioner

## 2023-04-10 ENCOUNTER — Encounter: Payer: Self-pay | Admitting: Nurse Practitioner

## 2023-04-10 VITALS — BP 128/68 | HR 73 | Resp 20 | Ht 61.22 in | Wt 210.0 lb

## 2023-04-10 DIAGNOSIS — Z780 Asymptomatic menopausal state: Secondary | ICD-10-CM

## 2023-04-10 DIAGNOSIS — J302 Other seasonal allergic rhinitis: Secondary | ICD-10-CM

## 2023-04-10 DIAGNOSIS — Z01419 Encounter for gynecological examination (general) (routine) without abnormal findings: Secondary | ICD-10-CM | POA: Diagnosis not present

## 2023-04-10 DIAGNOSIS — R7303 Prediabetes: Secondary | ICD-10-CM | POA: Diagnosis not present

## 2023-04-10 DIAGNOSIS — Z833 Family history of diabetes mellitus: Secondary | ICD-10-CM | POA: Diagnosis not present

## 2023-04-10 DIAGNOSIS — D509 Iron deficiency anemia, unspecified: Secondary | ICD-10-CM | POA: Diagnosis not present

## 2023-04-10 DIAGNOSIS — E785 Hyperlipidemia, unspecified: Secondary | ICD-10-CM

## 2023-04-10 DIAGNOSIS — R051 Acute cough: Secondary | ICD-10-CM

## 2023-04-10 MED ORDER — BENZONATATE 100 MG PO CAPS
100.0000 mg | ORAL_CAPSULE | Freq: Three times a day (TID) | ORAL | 0 refills | Status: DC | PRN
Start: 2023-04-10 — End: 2023-09-25

## 2023-04-10 MED ORDER — FEXOFENADINE HCL 180 MG PO TABS
180.0000 mg | ORAL_TABLET | Freq: Every day | ORAL | 0 refills | Status: DC
Start: 2023-04-10 — End: 2024-02-27

## 2023-04-10 NOTE — Progress Notes (Signed)
Savannah Cook November 07, 1966 [id number]   History:  57 y.o. G1P1 presents for annual exam. Postmenopausal - no HR. Had bleeding in October and November 2023. LMP prior to that was 07/2021. U/S 10/11/22 showed borderline endo thickness of 5.1 mm, repeat ultrasound showed 3.4 mm. No bleeding since. Normal pap history. 2018 colon cancer. On Eliquis for A. Flutter/fib. Requesting refills on allergy medications.   Gynecologic History Patient's last menstrual period was 10/06/2022 (exact date).   Contraception/Family planning: post menopausal status Sexually active: Yes  Health Maintenance Last Pap: 04/03/2022. Results were: Normal neg HPV Last mammogram: 09/07/2022. Results were: Normal Last colonoscopy: 10/05/2021. Results were: Benign polyps, 5-year recall Last Dexa: Not indicated  Past medical history, past surgical history, family history and social history were all reviewed and documented in the EPIC chart. Works for apartment complex. Boyfriend, lives in [place] but moving here. Daughter lives in [place], has 18 yo old daughter, speech therapist.   ROS:  A ROS was performed and pertinent positives and negatives are included.  Exam:  Vitals:   04/10/23 0814  BP: 128/68  Pulse: 73  Resp: 20  SpO2: 98%  Weight: 210 lb (95.3 kg)  Height: 5' 1.22" (1.555 m)     Body mass index is 39.39 kg/m.  General appearance:  Normal Thyroid:  Symmetrical, normal in size, without palpable masses or nodularity. Respiratory  Auscultation:  Clear without wheezing or rhonchi Cardiovascular  Auscultation:  Regular rate, without rubs, murmurs or gallops  Edema/varicosities:  Not grossly evident Abdominal  Soft,nontender, without masses, guarding or rebound.  Liver/spleen:  No organomegaly noted  Hernia:  None appreciated  Skin  Inspection:  Grossly normal Breasts: Examined lying and sitting.   Right: Without masses, retractions, discharge or axillary adenopathy.   Left: Without masses,  retractions, discharge or axillary adenopathy. Genitourinary   Inguinal/mons:  Normal without inguinal adenopathy  External genitalia:  Normal appearing vulva with no masses, tenderness, or lesions  BUS/Urethra/Skene's glands:  Normal  Vagina:  Normal appearing with normal color and discharge, no lesions  Cervix:  Normal appearing without discharge or lesions  Uterus:  Normal in size, shape and contour.  Midline and mobile, nontender  Adnexa/parametria:     Rt: Normal in size, without masses or tenderness.   Lt: Normal in size, without masses or tenderness.  Anus and perineum: Normal  Digital rectal exam: Deferred  Patient informed chaperone available to be present for breast and pelvic exam. Patient has requested no chaperone to be present. Patient has been advised what will be completed during breast and pelvic exam.   Assessment/Plan:  56 y.o. G1P1 for annual exam.   Well female exam with routine gynecological exam - Plan: CBC with Differential/Platelet, Comprehensive metabolic panel - Education provided on SBEs, importance of preventative screenings, current guidelines, high calcium diet, regular exercise, and multivitamin daily.  Postmenopausal - no HRT  Hyperlipidemia, unspecified hyperlipidemia type - Plan: Lipid panel  Family history of diabetes mellitus - Plan: Hemoglobin A1c  Seasonal allergies - Plan: fexofenadine (ALLEGRA ALLERGY) 180 MG tablet daily.   Acute cough - Plan: benzonatate (TESSALON PERLES) 100 MG capsule TID as needed for cough.   Screening for cervical cancer - Normal Pap history. Will repeat at 5-year interval per guidelines.   Screening for breast cancer - Normal mammogram history.  Continue annual screenings.  Normal breast exam today.  Screening for colon cancer - 2022 colonoscopy. History of colon cancer in 2018. Will repeat at 5-year interval per GI's  recommendation.  Screening for osteoporosis - Average risk. Will plan for DXA at age 41. Continue  Vitamin D supplement, high calcium diet, and increase exercise.   Return in 1 year for annual.     Olivia Mackie DNP, 8:53 AM 04/10/2023

## 2023-04-11 LAB — COMPREHENSIVE METABOLIC PANEL
AG Ratio: 1.4 (calc) (ref 1.0–2.5)
ALT: 21 U/L (ref 6–29)
AST: 18 U/L (ref 10–35)
Albumin: 4.3 g/dL (ref 3.6–5.1)
Alkaline phosphatase (APISO): 79 U/L (ref 37–153)
BUN: 19 mg/dL (ref 7–25)
CO2: 29 mmol/L (ref 20–32)
Calcium: 9.7 mg/dL (ref 8.6–10.4)
Chloride: 103 mmol/L (ref 98–110)
Creat: 0.84 mg/dL (ref 0.50–1.03)
Globulin: 3.1 g/dL (calc) (ref 1.9–3.7)
Glucose, Bld: 96 mg/dL (ref 65–99)
Potassium: 4.9 mmol/L (ref 3.5–5.3)
Sodium: 139 mmol/L (ref 135–146)
Total Bilirubin: 0.5 mg/dL (ref 0.2–1.2)
Total Protein: 7.4 g/dL (ref 6.1–8.1)

## 2023-04-11 LAB — CBC WITH DIFFERENTIAL/PLATELET
Absolute Monocytes: 529 cells/uL (ref 200–950)
Basophils Absolute: 38 cells/uL (ref 0–200)
Basophils Relative: 0.7 %
Eosinophils Absolute: 211 cells/uL (ref 15–500)
Eosinophils Relative: 3.9 %
HCT: 43 % (ref 35.0–45.0)
Hemoglobin: 14.3 g/dL (ref 11.7–15.5)
Lymphs Abs: 1922 cells/uL (ref 850–3900)
MCH: 29.6 pg (ref 27.0–33.0)
MCHC: 33.3 g/dL (ref 32.0–36.0)
MCV: 89 fL (ref 80.0–100.0)
MPV: 11 fL (ref 7.5–12.5)
Monocytes Relative: 9.8 %
Neutro Abs: 2700 cells/uL (ref 1500–7800)
Neutrophils Relative %: 50 %
Platelets: 285 10*3/uL (ref 140–400)
RBC: 4.83 10*6/uL (ref 3.80–5.10)
RDW: 12.5 % (ref 11.0–15.0)
Total Lymphocyte: 35.6 %
WBC: 5.4 10*3/uL (ref 3.8–10.8)

## 2023-04-11 LAB — LIPID PANEL
Cholesterol: 220 mg/dL — ABNORMAL HIGH (ref <200)
HDL: 54 mg/dL (ref >=50)
LDL Cholesterol (Calc): 141 mg/dL (calc) — ABNORMAL HIGH
Non-HDL Cholesterol (Calc): 166 mg/dL (calc) — ABNORMAL HIGH (ref <130)
Total CHOL/HDL Ratio: 4.1 (calc) (ref <5.0)
Triglycerides: 128 mg/dL (ref <150)

## 2023-04-11 LAB — HEMOGLOBIN A1C
Hgb A1c MFr Bld: 6.2 % of total Hgb — ABNORMAL HIGH (ref <5.7)
Mean Plasma Glucose: 131 mg/dL
eAG (mmol/L): 7.3 mmol/L

## 2023-04-16 ENCOUNTER — Telehealth: Payer: Self-pay | Admitting: Cardiology

## 2023-04-16 DIAGNOSIS — I480 Paroxysmal atrial fibrillation: Secondary | ICD-10-CM

## 2023-04-16 MED ORDER — DILTIAZEM HCL ER COATED BEADS 120 MG PO CP24
120.0000 mg | ORAL_CAPSULE | Freq: Every day | ORAL | 11 refills | Status: DC
Start: 2023-04-16 — End: 2023-06-17

## 2023-04-16 NOTE — Telephone Encounter (Signed)
Pt c/o medication issue:  1. Name of Medication: diltiazem (CARDIZEM CD) 120 MG 24 hr capsule   2. How are you currently taking this medication (dosage and times per day)? As prescribed   3. Are you having a reaction (difficulty breathing--STAT)?   4. What is your medication issue?  Patient's insurance is requesting a 30 day instead of 90 day and is requesting call back. Requesting a new script called in.

## 2023-05-17 ENCOUNTER — Ambulatory Visit: Payer: BC Managed Care – PPO | Admitting: Internal Medicine

## 2023-05-17 VITALS — BP 128/82 | HR 104 | Temp 98.0°F | Resp 16 | Ht 61.0 in | Wt 212.8 lb

## 2023-05-17 DIAGNOSIS — R739 Hyperglycemia, unspecified: Secondary | ICD-10-CM

## 2023-05-17 DIAGNOSIS — G4733 Obstructive sleep apnea (adult) (pediatric): Secondary | ICD-10-CM

## 2023-05-17 DIAGNOSIS — I4891 Unspecified atrial fibrillation: Secondary | ICD-10-CM

## 2023-05-17 DIAGNOSIS — I480 Paroxysmal atrial fibrillation: Secondary | ICD-10-CM | POA: Diagnosis not present

## 2023-05-17 DIAGNOSIS — E782 Mixed hyperlipidemia: Secondary | ICD-10-CM | POA: Insufficient documentation

## 2023-05-17 NOTE — Progress Notes (Signed)
Subjective:    Patient ID: Savannah Cook, female    DOB: 08-28-1966, 57 y.o.   MRN: [id number]  DOS:  05/17/2023 Type of visit - description: Follow-up.  LOV more than a year ago.  Here with multiple concerns.  History of paroxysmal A-fib, noted to be on A-fib today. She denies chest pain, no difficulty breathing.  No recent palpitations.  Reports left foot and ankle swelling on and off, no redness, no warmness, no problems on the right leg.  Leg stiffness, mostly early in the morning when she wakes up and if she stays sitting in the same position for a long time.  Denies any wrist, knuckles or fingers puffiness.  Gradual increase of weight over the last year.  Current BMI 40.2.  OSA: Has not been able to proceed with workup, still snoring, still low energy.   Review of Systems See above   Past Medical History:  Diagnosis Date   Anxiety    hx of, no medications now   Atrial fibrillation (HCC)    Atrial flutter (HCC)    Colon cancer (HCC) 06/10/2017   GERD (gastroesophageal reflux disease)    Hx of colonic polyp - sessile serrated 10/08/2018   Iron deficiency anemia due to chronic blood loss    colon cancer   Seasonal allergies    Shingles 11/2021    Past Surgical History:  Procedure Laterality Date   CHOLECYSTECTOMY N/A 07/25/2017   Procedure: CHOLECYSTECTOMY;  Surgeon: Avel Peace, MD;  Location: [facility];  Service: General;  Laterality: N/A;   COLONOSCOPY  06/10/2018   w/Dr.Gessner    LAPAROSCOPIC PARTIAL COLECTOMY N/A 07/25/2017   Procedure: LAPAROSCOPIC PARTIAL COLECTOMY;  Surgeon: Avel Peace, MD;  Location: [facility];  Service: General;  Laterality: N/A;   UPPER GASTROINTESTINAL ENDOSCOPY  06/10/2018   WISDOM TOOTH EXTRACTION     WRIST SURGERY Right 04/2012    Current Outpatient Medications  Medication Instructions   benzonatate (TESSALON PERLES) 100 mg, Oral, 3 times daily PRN   Cholecalciferol (VITAMIN D3 PO) 2,000 mg, Oral, Daily   Collagen-Vitamin C  (COLLAGEN PLUS VITAMIN C PO) 1 tablet, Oral, Every other day   diltiazem (CARDIZEM CD) 120 mg, Oral, Daily   ELDERBERRY PO Oral, Every day  2000 mg   fexofenadine (ALLEGRA ALLERGY) 180 mg, Oral, Daily   ibuprofen (ADVIL) 800 MG tablet Oral   Multiple Vitamin (MULTIVITAMIN ADULT PO) 1 tablet, Oral, Every morning, One daily- Every Woman's 55 plus- [place]   valACYclovir (VALTREX) 1,000 mg, Oral, 2 times daily, Take twice daily x 3-5 days at first sign of outbreak       Objective:   Physical Exam BP 128/82 (BP Location: Right Arm, Patient Position: Sitting, Cuff Size: Normal)   Pulse (!) 104   Temp 98 F (36.7 C)   Resp 16   Ht 5\' 1"  (1.549 m)   Wt 212 lb 12.8 oz (96.5 kg)   LMP 10/06/2022 (Exact Date) Comment: Last cycle was in 07/2021  SpO2 97%   BMI 40.21 kg/m  General:   Well developed, NAD, BMI noted.  HEENT:  Normocephalic . Face symmetric, atraumatic Lungs:  CTA B Normal respiratory effort, no intercostal retractions, no accessory muscle use. Heart: Tachycardic, irregularly irregular. Abdomen:  Not distended, soft, non-tender. No rebound or rigidity.   Skin: Not pale. Not jaundice Upper extremities: No puffiness or synovitis Lower extremities: no pretibial edema bilaterally  Neurologic:  alert & oriented X3.  Speech normal, gait appropriate for age  and unassisted Psych--  Cognition and judgment appear intact.  Cooperative with normal attention span and concentration.  Behavior appropriate. No anxious or depressed appearing.     Assessment    Assessment Dx colon cancer,2018, s/p surgery, no chemo;  GI Dr. Leone Payor, colonoscopy 09-2018 GERD   Menopausal LMP 07-2021. Asthma Shingles December 2022, right face.  PLAN  Paroxysmal atrial fibrillation: LOV cardiology 06/22/2022, on diltiazem. EKG today: a fib, rate 119 She is on A-fib, asymptomatic, states that but typically happening when she is stressed (like today).   CHA2DS2-VASc score 1, (No HTN,No DM, no  history of a stroke), does not qualify for anticoagulation that I can tell. Message sent to cardiology to see if something different seems to be done.  Will notify patient if changes need to be made.  Morbid obesity: Request treatment, in addition to a healthy diet she could benefit from injectables.  We talk about Greggory Keen, she will do her own research about it and let me know if interested.    Hyperglycemia: A1c 6.2, this was discussed with the patient.  In addition to a healthy diet, metformin could be a option.  She is not interested at this point. Hyperlipidemia: LDL 141, 10-year CV RF 6.6. OSA: Highly suspicious for OSA, has not been able to secure a sleep study, referred to neurology. Left ankle and foot swelling: Unclear etiology, no evidence of DVT.  Symptoms are on and off.  Observation Leg stiffness: Reassess on RTC. H/o colon cancer: Follow-up by GI, last colonoscopy 10-05-2021, next due 2027 Preventive care: Vaccines I recommend: Tdap, Shingrix, COVID booster, flu shot yearly. Sees gyn  RTC 27-month CPX   Time spent with the patient and coordinating her care 45 minutes.  Advised to come back more frequently.

## 2023-05-17 NOTE — Patient Instructions (Addendum)
Will refer you to neurology for sleep apnea  Think about Mounjaro for weight loss  Go to emergency room if chest pain or difficulty breathing.  Come back in 3 months for a physical exam

## 2023-05-17 NOTE — Assessment & Plan Note (Signed)
Paroxysmal atrial fibrillation: LOV cardiology 06/22/2022, on diltiazem. EKG today: a fib, rate 119 She is on A-fib, asymptomatic, states that but typically happening when she is stressed (like today).   CHA2DS2-VASc score 1, (No HTN,No DM, no history of a stroke), does not qualify for anticoagulation that I can tell. Message sent to cardiology to see if something different seems to be done.  Will notify patient if changes need to be made.  Morbid obesity: Request treatment, in addition to a healthy diet she could benefit from injectables.  We talk about Greggory Keen, she will do her own research about it and let me know if interested.    Hyperglycemia: A1c 6.2, this was discussed with the patient.  In addition to a healthy diet, metformin could be a option.  She is not interested at this point. Hyperlipidemia: LDL 141, 10-year CV RF 6.6. OSA: Highly suspicious for OSA, has not been able to secure a sleep study, referred to neurology. Left ankle and foot swelling: Unclear etiology, no evidence of DVT.  Symptoms are on and off.  Observation Leg stiffness: Reassess on RTC. H/o colon cancer: Follow-up by GI, last colonoscopy 10-05-2021, next due 2027 Preventive care: Vaccines I recommend: Tdap, Shingrix, COVID booster, flu shot yearly. Sees gyn  RTC 45-month CPX

## 2023-06-17 ENCOUNTER — Ambulatory Visit (HOSPITAL_BASED_OUTPATIENT_CLINIC_OR_DEPARTMENT_OTHER): Payer: BC Managed Care – PPO | Admitting: Cardiology

## 2023-06-17 ENCOUNTER — Other Ambulatory Visit (HOSPITAL_BASED_OUTPATIENT_CLINIC_OR_DEPARTMENT_OTHER): Payer: Self-pay

## 2023-06-17 ENCOUNTER — Encounter (HOSPITAL_BASED_OUTPATIENT_CLINIC_OR_DEPARTMENT_OTHER): Payer: Self-pay | Admitting: Cardiology

## 2023-06-17 ENCOUNTER — Encounter (HOSPITAL_BASED_OUTPATIENT_CLINIC_OR_DEPARTMENT_OTHER): Payer: Self-pay

## 2023-06-17 VITALS — BP 131/84 | HR 83 | Ht 62.0 in | Wt 212.0 lb

## 2023-06-17 DIAGNOSIS — Z713 Dietary counseling and surveillance: Secondary | ICD-10-CM | POA: Diagnosis not present

## 2023-06-17 DIAGNOSIS — Z7189 Other specified counseling: Secondary | ICD-10-CM

## 2023-06-17 DIAGNOSIS — I480 Paroxysmal atrial fibrillation: Secondary | ICD-10-CM

## 2023-06-17 DIAGNOSIS — I471 Supraventricular tachycardia, unspecified: Secondary | ICD-10-CM | POA: Diagnosis not present

## 2023-06-17 MED ORDER — DILTIAZEM HCL ER COATED BEADS 120 MG PO CP24
120.0000 mg | ORAL_CAPSULE | Freq: Every day | ORAL | 3 refills | Status: DC
Start: 2023-06-17 — End: 2023-07-04
  Filled 2023-06-17: qty 90, 90d supply, fill #0

## 2023-06-17 NOTE — Progress Notes (Signed)
Cardiology Office Note:  .    Date:  06/17/2023  ID:  Savannah Cook, DOB March 22, 1966, MRN [id number] PCP: Wanda Plump, MD  [facility] Providers Cardiologist:  Jodelle Red, MD Cardiology APP:  Newman Nip, NP (Inactive)     History of Present Illness: .    Savannah Cook is a 57 y.o. female with a hx of atrial fibrillation, atrial flutter, GERD, colon cancer, iron deficiency anemia, and shingles, who presents for follow-up today. She was initially seen 06/22/2022 as a new consult at the request of Newman Nip, NP for the evaluation and management of paroxysmal atrial fibrillation.   She saw Rudi Coco, NP on 04/19/2022 where she was in sinus rhythm. She reported a single brief of episode of Afib one day prior. Since her CHA2DS2VASc score is 1 and her Afib was minimal, she could stop taking Eliquis. She obtained a Kardia device to help monitor for arrhythmias with her phone. A sleep study had been ordered but denied by her insurance.    Cardiovascular risk factors: Prior clinical ASCVD: none Tobacco use history: She was a former smoker. She quit in 1987. Family history: Both of her parents had diabetes. Her mother was positive for bipolar disorder. Her mother was positive for hypertension.    At her initial visit, she was struggling with anxiety, depression, and associated tension headaches. She had been taking Tylenol during the day before work to help with racing heart rates/anxiety. She believed her heart racing could be mainly due to anxiety but worried about being in Afib. We discussed management strategies, included treatment (rate or rhythm control when afib occurs) or preventative daily med. After shared decision making, we started low dose diltiazem with monitoring of symptoms.   Today, she states that she is feeling a little fatigued. She reports a recent mechanical fall off a ladder onto the grass, about 4 feet. No severe injury. Otherwise she has been  feeling alright and is mostly concerned about her weight gain. Lately she continues to work on dietary changes. Previously she drank coffee with creamer every day. She has since tried switching to [employer] protein drinks.  Additionally she brings in her medications today, and asks about starting turmeric. In the mornings she has noticed increased stiffness in her legs. She also complains of left foot swelling with noticeable puffiness by the end of the day.   She notes that whenever she is feeling anxious/stressed at work or rushing, she will "start to flutter" associated with a cough at onset. Her palpitations may last up to 30 minutes, depending on if she is feeling very anxious. At her recent PCP visit 05/17/23 she was found to be in asymptomatic atrial fibrillation at 119 bpm.  Never had sleep apnea, but she endorses snoring. She completed a sleep study in 2008 that was reportedly negative. However, her fiancee has told her that her snoring has worsened. We discussed possible therapies including nasal strips.  She denies any chest pain, shortness of breath, lightheadedness, headaches, syncope, orthopnea, or PND.  ROS:  Please see the history of present illness. ROS otherwise negative except as noted.  (+) Anxiety/Stress (+) Fluttering palpitations (+) Fatigue (+) Recent fall (+) BLE stiffness (+) LE edema L>R (+) Snoring  Studies Reviewed: Marland Kitchen       ECG not performed today   Physical Exam:    VS:  BP 131/84   Pulse 83   Ht 5\' 2"  (1.575 m)   Wt 212 lb (  96.2 kg)   LMP 10/06/2022 (Exact Date) Comment: Last cycle was in 07/2021  SpO2 97%   BMI 38.78 kg/m    Wt Readings from Last 3 Encounters:  06/17/23 212 lb (96.2 kg)  05/17/23 212 lb 12.8 oz (96.5 kg)  04/10/23 210 lb (95.3 kg)    GEN: Well nourished, well developed in no acute distress HEENT: Normal, moist mucous membranes NECK: No JVD CARDIAC: regular rhythm, normal S1 and S2, no rubs or gallops. No murmur. VASCULAR: Radial  and DP pulses 2+ bilaterally. No carotid bruits RESPIRATORY:  Clear to auscultation without rales, wheezing or rhonchi  ABDOMEN: Soft, non-tender, non-distended MUSCULOSKELETAL:  Ambulates independently SKIN: Warm and dry, no edema NEUROLOGIC:  Alert and oriented x 3. No focal neuro deficits noted. PSYCHIATRIC:  Normal affect   ASSESSMENT AND PLAN: .    Paroxysmal atrial fibrillation Possible atrial flutter on monitor pSVT, frequent -CHA2DS2/VAS Stroke Risk Points=1 -not indicated for anticoagulation at this time -monitor showed frequent PSVT -we discussed management strategies, included treatment (rate or rhythm control when afib occurs) or preventative daily med. After shared decision making, will start low dose diltiazem and monitor symptoms.   -diltiazem is costly at her pharmacy--reviewed and it is on the $15 med list at [facility] pharmacy, ordered today -consider sleep study (no apnea on prior study in 2008 per report) given snoring once more affordable on insurance   Cardiac risk counseling and prevention recommendations: -recommend heart healthy/Mediterranean diet, with whole grains, fruits, vegetable, fish, lean meats, nuts, and olive oil. Limit salt. -recommend moderate walking, 3-5 times/week for 30-50 minutes each session. Aim for at least 150 minutes.week. Goal should be pace of 3 miles/hours, or walking 1.5 miles in 30 minutes -recommend avoidance of tobacco products. Avoid excess alcohol. The 10-year ASCVD risk score (Arnett DK, et al., 2019) is: 6.9%   Values used to calculate the score:     Age: 74 years     Sex: Female     Is Non-Hispanic African American: No     Diabetic: No     Tobacco smoker: Yes     Systolic Blood Pressure: 131 mmHg     Is BP treated: No     HDL Cholesterol: 54 mg/dL     Total Cholesterol: 220 mg/dL   Dispo: Follow-up in 1 year, or sooner as needed.  Total time of encounter: 42 minutes total time of encounter, including 25 minutes  spent in face-to-face patient care. This time includes coordination of care and counseling regarding above conditions. Remainder of non-face-to-face time involved reviewing chart documents/testing relevant to the patient encounter and documentation in the medical record.  Jodelle Red, MD, PhD, [facility]  [facility]    I,Mathew Stumpf,acting as a scribe for Jodelle Red, MD.,have documented all relevant documentation on the behalf of Jodelle Red, MD,as directed by  Jodelle Red, MD while in the presence of Jodelle Red, MD.  I, Jodelle Red, MD, have reviewed all documentation for this visit. The documentation on 06/17/23 for the exam, diagnosis, procedures, and orders are all accurate and complete.   Signed, Jodelle Red, MD

## 2023-06-17 NOTE — Patient Instructions (Signed)
Medication Instructions:  Your physician recommends that you continue on your current medications as directed. Please refer to the Current Medication list given to you today.  *If you need a refill on your cardiac medications before your next appointment, please call your pharmacy*   Follow-Up: At [facility], you and your health needs are our priority.  As part of our continuing mission to provide you with exceptional heart care, we have created designated Provider Care Teams.  These Care Teams include your primary Cardiologist (physician) and Advanced Practice Providers (APPs -  Physician Assistants and Nurse Practitioners) who all work together to provide you with the care you need, when you need it.  We recommend signing up for the patient portal called "MyChart".  Sign up information is provided on this After Visit Summary.  MyChart is used to connect with patients for Virtual Visits (Telemedicine).  Patients are able to view lab/test results, encounter notes, upcoming appointments, etc.  Non-urgent messages can be sent to your provider as well.   To learn more about what you can do with MyChart, go to [email or web address].    Your next appointment:   1 year with Dr. Cristal Deer

## 2023-07-04 ENCOUNTER — Other Ambulatory Visit (HOSPITAL_BASED_OUTPATIENT_CLINIC_OR_DEPARTMENT_OTHER): Payer: Self-pay

## 2023-07-04 MED ORDER — DILTIAZEM HCL ER COATED BEADS 120 MG PO CP24
120.0000 mg | ORAL_CAPSULE | Freq: Every day | ORAL | 3 refills | Status: DC
Start: 2023-07-04 — End: 2024-06-16
  Filled 2023-07-04: qty 90, 90d supply, fill #0
  Filled 2023-09-27: qty 90, 90d supply, fill #1
  Filled 2023-09-27: qty 30, 30d supply, fill #1
  Filled 2023-09-27: qty 90, 90d supply, fill #1
  Filled 2023-09-27: qty 30, 30d supply, fill #1
  Filled 2024-01-06 (×2): qty 90, 90d supply, fill #2
  Filled 2024-04-04: qty 90, 90d supply, fill #3

## 2023-08-01 ENCOUNTER — Encounter (INDEPENDENT_AMBULATORY_CARE_PROVIDER_SITE_OTHER): Payer: Self-pay

## 2023-08-13 ENCOUNTER — Telehealth: Payer: Self-pay | Admitting: Internal Medicine

## 2023-08-13 NOTE — Telephone Encounter (Signed)
Pt called and mentioned she will like a female Dr or NP to see her moving forward if possible.  She is with [facility] at the moment and is seeing Dr. Willow Ora.  She's looking to move on with [facility] [place]. Is there anyone that is able to take her in as a pt, (toc) transfer of care if possible? Please let me know some type of okay onto moving forward so an appointment can be scheduled.

## 2023-08-30 ENCOUNTER — Encounter (HOSPITAL_BASED_OUTPATIENT_CLINIC_OR_DEPARTMENT_OTHER): Payer: Self-pay

## 2023-08-30 ENCOUNTER — Encounter: Payer: BC Managed Care – PPO | Admitting: Internal Medicine

## 2023-09-02 NOTE — Telephone Encounter (Signed)
Pt would like to transfer her care to [facility]. She lives in [place] and would also like a female provider. Savannah Cook has more availability for a toc. Please let me know and I will cb pt. [phone] [facility])

## 2023-09-23 ENCOUNTER — Encounter: Payer: BC Managed Care – PPO | Admitting: Nurse Practitioner

## 2023-09-25 ENCOUNTER — Other Ambulatory Visit (HOSPITAL_BASED_OUTPATIENT_CLINIC_OR_DEPARTMENT_OTHER): Payer: Self-pay

## 2023-09-25 ENCOUNTER — Ambulatory Visit: Payer: BC Managed Care – PPO | Admitting: Nurse Practitioner

## 2023-09-25 ENCOUNTER — Encounter: Payer: Self-pay | Admitting: Nurse Practitioner

## 2023-09-25 VITALS — BP 114/80 | HR 81 | Temp 97.1°F | Ht 62.0 in | Wt 199.6 lb

## 2023-09-25 DIAGNOSIS — F419 Anxiety disorder, unspecified: Secondary | ICD-10-CM

## 2023-09-25 DIAGNOSIS — F32A Depression, unspecified: Secondary | ICD-10-CM | POA: Diagnosis not present

## 2023-09-25 DIAGNOSIS — I480 Paroxysmal atrial fibrillation: Secondary | ICD-10-CM

## 2023-09-25 DIAGNOSIS — Z85038 Personal history of other malignant neoplasm of large intestine: Secondary | ICD-10-CM | POA: Insufficient documentation

## 2023-09-25 DIAGNOSIS — I4892 Unspecified atrial flutter: Secondary | ICD-10-CM | POA: Diagnosis not present

## 2023-09-25 DIAGNOSIS — E782 Mixed hyperlipidemia: Secondary | ICD-10-CM

## 2023-09-25 DIAGNOSIS — R7303 Prediabetes: Secondary | ICD-10-CM | POA: Diagnosis not present

## 2023-09-25 DIAGNOSIS — I471 Supraventricular tachycardia, unspecified: Secondary | ICD-10-CM

## 2023-09-25 DIAGNOSIS — E559 Vitamin D deficiency, unspecified: Secondary | ICD-10-CM

## 2023-09-25 MED ORDER — ESCITALOPRAM OXALATE 10 MG PO TABS
10.0000 mg | ORAL_TABLET | Freq: Every day | ORAL | 1 refills | Status: DC
Start: 2023-09-25 — End: 2023-10-24
  Filled 2023-09-25: qty 30, 30d supply, fill #0
  Filled 2023-10-14: qty 30, 30d supply, fill #1

## 2023-09-25 NOTE — Assessment & Plan Note (Signed)
Has a history of colon cancer in 2018.  She states that this was treated with surgery and colectomy alone.  She currently is getting colonoscopies every 3 years.

## 2023-09-25 NOTE — Assessment & Plan Note (Signed)
Chronic, stable.  She is currently following with cardiology.  She currently takes diltiazem 120 mg daily.  She is not taking any blood thinners at this time.  Continue collaboration recommendations from cardiology.

## 2023-09-25 NOTE — Assessment & Plan Note (Addendum)
Chronic, stable.  Will check CMP, CBC, lipid panel today and treat based on results.

## 2023-09-25 NOTE — Assessment & Plan Note (Addendum)
Chronic, not controlled.  She states that her anxiety has worsened over the last 4 months.  She is experiencing brain fog, decreased motivation, constant worry.  She denies SI/HI.  Her PHQ-9 and GAD-7 are both 11 today.  She has been taking Ashwagandha  Will have her start Lexapro 10 mg daily.  Discussed possible side effects.  Check TSH, vitamin D, vitamin B12 today.  Follow-up in 4 to 6 weeks.

## 2023-09-25 NOTE — Patient Instructions (Addendum)
It was great to see you!  Start lexapro 1 tablet daily (10 mg)  Look into therapists who you may want to see and talk to  If you want to look up effexor you can (I like this one for menopausal women)   We are checking your labs today and will let you know the results via mychart/phone.   Let's follow-up in 4-6 weeks, sooner if you have concerns.  If a referral was placed today, you will be contacted for an appointment. Please note that routine referrals can sometimes take up to 3-4 weeks to process. Please call our office if you haven't heard anything after this time frame.  Take care,  Rodman Pickle, NP

## 2023-09-25 NOTE — Assessment & Plan Note (Signed)
Chronic, stable.  Her last A1c was 6.2%.  She has been eating less sugars.  Will check A1c today.

## 2023-09-25 NOTE — Progress Notes (Signed)
New Patient Visit  BP 114/80 (BP Location: Right Arm)   Pulse 81   Temp (!) 97.1 F (36.2 C)   Ht 5\' 2"  (1.575 m)   Wt 199 lb 9.6 oz (90.5 kg)   LMP 10/06/2022 (Exact Date) Comment: Last cycle was in 07/2021  SpO2 97%   BMI 36.51 kg/m    Subjective:    Patient ID: Savannah Cook, female    DOB: 04-17-1966, 57 y.o.   MRN: [id number]  CC: Chief Complaint  Patient presents with   Transfer of Care    Est. Care, concerns with anxiety and depression    HPI: Savannah Cook is a 57 y.o. female presents to transfer care to a new provider.  Introduced to Publishing rights manager role and practice setting.  All questions answered.  Discussed provider/patient relationship and expectations.  She has a history of anxiety and depression. She states that this started after starting through menopause. After 6 months it got better. Then it worsened again. She has been experiencing brain fog, constant worry, overthinking, and trouble sleeping. She has been taking ashwagandha, l-theanine, and hello energy. She has tried cymbalta in the past, but it made her more fatigued, and lexapro which did help. She denies SI/HI.   She has a history of paroxysmal A-fib, a flutter, SVT.  She is currently following with cardiology and takes diltiazem 120 mg daily.  She is not currently on any blood thinners.  She denies chest pain and shortness of breath.  She has a history of colon cancer in 2018.  She states that she had a colectomy with negative margins and did not need any chemo or radiation.  She is currently getting colonoscopies every 3 years.     09/25/2023    4:38 PM 05/17/2023    3:58 PM 06/04/2022    2:23 PM 01/31/2022    8:48 AM 01/13/2021    3:54 PM  Depression screen PHQ 2/9  Decreased Interest 2 0 2 0 2  Down, Depressed, Hopeless 2 0 2 0 2  PHQ - 2 Score 4 0 4 0 4  Altered sleeping 2 0 2 0 1  Tired, decreased energy 2 0 1 2 3   Change in appetite 0 0 1 1 1   Feeling bad or failure about yourself  2 0  2 0 2  Trouble concentrating 1 0 1 0 2  Moving slowly or fidgety/restless 0 0 1 1 2   Suicidal thoughts 0 0 0 0 0  PHQ-9 Score 11 0 12 4 15   Difficult doing work/chores Very difficult Not difficult at all Somewhat difficult Not difficult at all Not difficult at all  .    09/25/2023    4:38 PM 05/17/2023    3:58 PM  GAD 7 : Generalized Anxiety Score  Nervous, Anxious, on Edge 3 0  Control/stop worrying 2 0  Worry too much - different things 3 0  Trouble relaxing 2 0  Restless 0 0  Easily annoyed or irritable 0 0  Afraid - awful might happen 1 0  Total GAD 7 Score 11 0  Anxiety Difficulty Somewhat difficult Not difficult at all    Past Medical History:  Diagnosis Date   Anxiety    hx of, no medications now   Atrial fibrillation (HCC)    Atrial flutter (HCC)    Colon cancer (HCC) 06/10/2017   GERD (gastroesophageal reflux disease)    Hx of colonic polyp - sessile serrated 10/08/2018   Iron deficiency  anemia due to chronic blood loss    colon cancer   Seasonal allergies    Shingles 11/2021    Past Surgical History:  Procedure Laterality Date   CHOLECYSTECTOMY N/A 07/25/2017   Procedure: CHOLECYSTECTOMY;  Surgeon: Avel Peace, MD;  Location: [facility];  Service: General;  Laterality: N/A;   COLONOSCOPY  06/10/2018   w/Dr.Gessner    LAPAROSCOPIC PARTIAL COLECTOMY N/A 07/25/2017   Procedure: LAPAROSCOPIC PARTIAL COLECTOMY;  Surgeon: Avel Peace, MD;  Location: [facility];  Service: General;  Laterality: N/A;   UPPER GASTROINTESTINAL ENDOSCOPY  06/10/2018   WISDOM TOOTH EXTRACTION     WRIST SURGERY Right 04/2012    Family History  Problem Relation Age of Onset   Diabetes Mother    Hypertension Mother    Bipolar disorder Mother    COPD Mother    Diabetes Father    Hypertension Father    Lung cancer Father    Heart disease Maternal Grandmother    Colon cancer Maternal Grandfather 95   Colon polyps Neg Hx    Esophageal cancer Neg Hx    Rectal cancer Neg Hx    Stomach  cancer Neg Hx      Social History   Tobacco Use   Smoking status: Former    Current packs/day: 0.00    Types: Cigarettes    Quit date: 12/17/1985    Years since quitting: 37.7   Smokeless tobacco: Never   Tobacco comments:    Former smoker 04/19/22  Vaping Use   Vaping status: Never Used  Substance Use Topics   Alcohol use: Yes    Alcohol/week: 1.0 standard drink of alcohol    Types: 1 Standard drinks or equivalent per week    Comment: Rare   Drug use: No    Current Outpatient Medications on File Prior to Visit  Medication Sig Dispense Refill   acetaminophen (TYLENOL) 500 MG tablet Take 500 mg by mouth every 6 (six) hours as needed for moderate pain, fever, headache or mild pain.     ASHWAGANDHA PO Take by mouth daily.     Cholecalciferol (VITAMIN D3 PO) Take 1,000 Units by mouth daily.     Collagen-Vitamin C (COLLAGEN PLUS VITAMIN C PO) Take 1 tablet by mouth every other day.     diltiazem (CARDIZEM CD) 120 MG 24 hr capsule Take 1 capsule (120 mg total) by mouth daily. 90 capsule 3   ELDERBERRY PO Take by mouth. Every day  2000 mg     fexofenadine (ALLEGRA ALLERGY) 180 MG tablet Take 1 tablet (180 mg total) by mouth daily. (Patient taking differently: Take 180 mg by mouth as needed.) 90 tablet 0   L-THEANINE PO Take by mouth daily.     Multiple Vitamin (MULTIVITAMIN ADULT PO) Take 1 tablet by mouth every morning. One daily- Every Woman's 55 plus- [place] [employer]     NON FORMULARY daily. Hello Energy     No current facility-administered medications on file prior to visit.     Review of Systems  Constitutional:  Positive for fatigue. Negative for fever.  HENT: Negative.    Eyes: Negative.   Respiratory: Negative.    Cardiovascular: Negative.   Gastrointestinal:  Positive for constipation. Negative for abdominal pain, nausea and vomiting.  Endocrine: Negative.   Genitourinary: Negative.   Musculoskeletal:  Positive for arthralgias.  Skin: Negative.   Neurological: Negative.    Psychiatric/Behavioral:  Positive for sleep disturbance. The patient is nervous/anxious.       Objective:  BP 114/80 (BP Location: Right Arm)   Pulse 81   Temp (!) 97.1 F (36.2 C)   Ht 5\' 2"  (1.575 m)   Wt 199 lb 9.6 oz (90.5 kg)   LMP 10/06/2022 (Exact Date) Comment: Last cycle was in 07/2021  SpO2 97%   BMI 36.51 kg/m   Wt Readings from Last 3 Encounters:  09/25/23 199 lb 9.6 oz (90.5 kg)  06/17/23 212 lb (96.2 kg)  05/17/23 212 lb 12.8 oz (96.5 kg)    BP Readings from Last 3 Encounters:  09/25/23 114/80  06/17/23 131/84  05/17/23 128/82    Physical Exam Vitals and nursing note reviewed.  Constitutional:      General: She is not in acute distress.    Appearance: Normal appearance.  HENT:     Head: Normocephalic and atraumatic.  Eyes:     Conjunctiva/sclera: Conjunctivae normal.  Cardiovascular:     Rate and Rhythm: Normal rate and regular rhythm.     Pulses: Normal pulses.     Heart sounds: Normal heart sounds.  Pulmonary:     Effort: Pulmonary effort is normal.     Breath sounds: Normal breath sounds.  Abdominal:     Palpations: Abdomen is soft.     Tenderness: There is no abdominal tenderness.  Musculoskeletal:        General: Normal range of motion.     Cervical back: Normal range of motion and neck supple.     Right lower leg: No edema.     Left lower leg: No edema.  Lymphadenopathy:     Cervical: No cervical adenopathy.  Skin:    General: Skin is warm and dry.  Neurological:     General: No focal deficit present.     Mental Status: She is alert and oriented to person, place, and time.     Cranial Nerves: No cranial nerve deficit.     Coordination: Coordination normal.     Gait: Gait normal.  Psychiatric:        Mood and Affect: Mood normal.        Behavior: Behavior normal.        Thought Content: Thought content normal.        Judgment: Judgment normal.        Assessment & Plan:   Problem List Items Addressed This Visit        Cardiovascular and Mediastinum   Atrial flutter (HCC)    Chronic, stable.  She is currently following with cardiology.  She currently takes diltiazem 120 mg daily.  She is not taking any blood thinners at this time.  Continue collaboration recommendations from cardiology.      Paroxysmal atrial fibrillation (HCC)    Chronic, stable.  She is currently following with cardiology.  She currently takes diltiazem 120 mg daily.  She is not taking any blood thinners at this time.  Continue collaboration recommendations from cardiology.      Paroxysmal SVT (supraventricular tachycardia) (HCC)    Chronic, stable.  She is currently following with cardiology.  She currently takes diltiazem 120 mg daily.  She is not taking any blood thinners at this time.  Continue collaboration recommendations from cardiology.        Other   Anxiety and depression - Primary    Chronic, not controlled.  She states that her anxiety has worsened over the last 4 months.  She is experiencing brain fog, decreased motivation, constant worry.  She denies SI/HI.  Her PHQ-9 and GAD-7  are both 11 today.  She has been taking Ashwagandha  Will have her start Lexapro 10 mg daily.  Discussed possible side effects.  Check TSH, vitamin D, vitamin B12 today.  Follow-up in 4 to 6 weeks.      Relevant Medications   escitalopram (LEXAPRO) 10 MG tablet   Other Relevant Orders   TSH   VITAMIN D 25 Hydroxy (Vit-D Deficiency, Fractures)   Vitamin B12   Mixed hyperlipidemia    Chronic, stable.  Will check CMP, CBC, lipid panel today and treat based on results      Relevant Orders   CBC with Differential/Platelet   Comprehensive metabolic panel   Lipid panel   History of colon cancer    Has a history of colon cancer in 2018.  She states that this was treated with surgery and colectomy alone.  She currently is getting colonoscopies every 3 years.      Prediabetes    Chronic, stable.  Her last A1c was 6.2%.  She has been eating less  sugars.  Will check A1c today.      Relevant Orders   Hemoglobin A1c     Follow up plan: Return in about 4 weeks (around 10/23/2023) for 4-6 weeks , Depression, Anxiety.  Olivya Sobol A Bassem Bernasconi

## 2023-09-26 ENCOUNTER — Encounter: Payer: Self-pay | Admitting: Nurse Practitioner

## 2023-09-26 ENCOUNTER — Other Ambulatory Visit (HOSPITAL_BASED_OUTPATIENT_CLINIC_OR_DEPARTMENT_OTHER): Payer: Self-pay

## 2023-09-26 LAB — COMPREHENSIVE METABOLIC PANEL
ALT: 27 U/L (ref 0–35)
AST: 19 U/L (ref 0–37)
Albumin: 4.5 g/dL (ref 3.5–5.2)
Alkaline Phosphatase: 82 U/L (ref 39–117)
BUN: 10 mg/dL (ref 6–23)
CO2: 32 meq/L (ref 19–32)
Calcium: 9.9 mg/dL (ref 8.4–10.5)
Chloride: 100 meq/L (ref 96–112)
Creatinine, Ser: 0.77 mg/dL (ref 0.40–1.20)
GFR: 84.63 mL/min (ref >60.00)
Glucose, Bld: 95 mg/dL (ref 70–99)
Potassium: 3.9 meq/L (ref 3.5–5.1)
Sodium: 140 meq/L (ref 135–145)
Total Bilirubin: 0.3 mg/dL (ref 0.2–1.2)
Total Protein: 7.3 g/dL (ref 6.0–8.3)

## 2023-09-26 LAB — CBC WITH DIFFERENTIAL/PLATELET
Basophils Absolute: 0.1 10*3/uL (ref 0.0–0.1)
Basophils Relative: 1.3 % (ref 0.0–3.0)
Eosinophils Absolute: 0.2 10*3/uL (ref 0.0–0.7)
Eosinophils Relative: 2.8 % (ref 0.0–5.0)
HCT: 44.9 % (ref 36.0–46.0)
Hemoglobin: 14.9 g/dL (ref 12.0–15.0)
Lymphocytes Relative: 43.8 % (ref 12.0–46.0)
Lymphs Abs: 2.5 10*3/uL (ref 0.7–4.0)
MCHC: 33.2 g/dL (ref 30.0–36.0)
MCV: 89.5 fL (ref 78.0–100.0)
Monocytes Absolute: 0.7 10*3/uL (ref 0.1–1.0)
Monocytes Relative: 12.2 % — ABNORMAL HIGH (ref 3.0–12.0)
Neutro Abs: 2.3 10*3/uL (ref 1.4–7.7)
Neutrophils Relative %: 39.9 % — ABNORMAL LOW (ref 43.0–77.0)
Platelets: 306 10*3/uL (ref 150.0–400.0)
RBC: 5.02 Mil/uL (ref 3.87–5.11)
RDW: 14.2 % (ref 11.5–15.5)
WBC: 5.7 10*3/uL (ref 4.0–10.5)

## 2023-09-26 LAB — TSH: TSH: 1.43 u[IU]/mL (ref 0.35–5.50)

## 2023-09-26 LAB — VITAMIN B12: Vitamin B-12: 267 pg/mL (ref 211–911)

## 2023-09-26 LAB — LIPID PANEL
Cholesterol: 215 mg/dL — ABNORMAL HIGH (ref 0–200)
HDL: 46.6 mg/dL (ref >39.00)
LDL Cholesterol: 116 mg/dL — ABNORMAL HIGH (ref 0–99)
NonHDL: 168.58
Total CHOL/HDL Ratio: 5
Triglycerides: 261 mg/dL — ABNORMAL HIGH (ref 0.0–149.0)
VLDL: 52.2 mg/dL — ABNORMAL HIGH (ref 0.0–40.0)

## 2023-09-26 LAB — VITAMIN D 25 HYDROXY (VIT D DEFICIENCY, FRACTURES): VITD: 20.61 ng/mL — ABNORMAL LOW (ref 30.00–100.00)

## 2023-09-26 LAB — HEMOGLOBIN A1C: Hgb A1c MFr Bld: 6 % (ref 4.6–6.5)

## 2023-09-26 MED ORDER — VITAMIN D (ERGOCALCIFEROL) 1.25 MG (50000 UNIT) PO CAPS
50000.0000 [IU] | ORAL_CAPSULE | ORAL | 0 refills | Status: DC
  Filled 2023-09-26: qty 12, 84d supply, fill #0

## 2023-09-26 MED ORDER — VITAMIN D (ERGOCALCIFEROL) 1.25 MG (50000 UNIT) PO CAPS: 50000.0000 [IU] | ORAL_CAPSULE | ORAL | 0 refills | Status: DC

## 2023-09-26 NOTE — Addendum Note (Signed)
Addended by: Rodman Pickle A on: 09/26/2023 12:06 PM   Modules accepted: Orders

## 2023-09-27 ENCOUNTER — Telehealth: Payer: Self-pay | Admitting: Cardiology

## 2023-09-27 ENCOUNTER — Other Ambulatory Visit (HOSPITAL_BASED_OUTPATIENT_CLINIC_OR_DEPARTMENT_OTHER): Payer: Self-pay

## 2023-09-27 ENCOUNTER — Encounter (HOSPITAL_BASED_OUTPATIENT_CLINIC_OR_DEPARTMENT_OTHER): Payer: Self-pay

## 2023-09-27 NOTE — Telephone Encounter (Signed)
Pt c/o medication issue:  1. Name of Medication:   diltiazem (CARDIZEM CD) 120 MG 24 hr capsule    2. How are you currently taking this medication (dosage and times per day)? Take 1 capsule (120 mg total) by mouth daily.   3. Are you having a reaction (difficulty breathing--STAT)? No  4. What is your medication issue? Pt states her insurance wont pay for this medication anymore and she would like an alternative. Please advise

## 2023-09-27 NOTE — Telephone Encounter (Addendum)
Spoke with [facility] pharmacy, [phone] for 90   Left message to call back and sent mychart message

## 2023-09-28 ENCOUNTER — Other Ambulatory Visit (HOSPITAL_BASED_OUTPATIENT_CLINIC_OR_DEPARTMENT_OTHER): Payer: Self-pay

## 2023-09-30 ENCOUNTER — Other Ambulatory Visit (HOSPITAL_BASED_OUTPATIENT_CLINIC_OR_DEPARTMENT_OTHER): Payer: Self-pay

## 2023-09-30 NOTE — Telephone Encounter (Signed)
Patient has things straightened out with pharmacy and getting filled, see mychart message

## 2023-10-10 ENCOUNTER — Encounter: Payer: Self-pay | Admitting: Nurse Practitioner

## 2023-10-11 DIAGNOSIS — Z1231 Encounter for screening mammogram for malignant neoplasm of breast: Secondary | ICD-10-CM | POA: Diagnosis not present

## 2023-10-14 ENCOUNTER — Other Ambulatory Visit: Payer: Self-pay

## 2023-10-14 ENCOUNTER — Other Ambulatory Visit (HOSPITAL_BASED_OUTPATIENT_CLINIC_OR_DEPARTMENT_OTHER): Payer: Self-pay

## 2023-10-17 ENCOUNTER — Encounter: Payer: Self-pay | Admitting: Nurse Practitioner

## 2023-10-17 ENCOUNTER — Encounter: Payer: BC Managed Care – PPO | Admitting: Nurse Practitioner

## 2023-10-24 ENCOUNTER — Ambulatory Visit: Payer: BC Managed Care – PPO | Admitting: Nurse Practitioner

## 2023-10-24 ENCOUNTER — Other Ambulatory Visit (HOSPITAL_BASED_OUTPATIENT_CLINIC_OR_DEPARTMENT_OTHER): Payer: Self-pay

## 2023-10-24 ENCOUNTER — Encounter: Payer: Self-pay | Admitting: Nurse Practitioner

## 2023-10-24 VITALS — BP 120/78 | HR 67 | Temp 97.4°F | Ht 62.0 in | Wt 201.4 lb

## 2023-10-24 DIAGNOSIS — F32A Depression, unspecified: Secondary | ICD-10-CM

## 2023-10-24 DIAGNOSIS — R251 Tremor, unspecified: Secondary | ICD-10-CM | POA: Insufficient documentation

## 2023-10-24 DIAGNOSIS — F419 Anxiety disorder, unspecified: Secondary | ICD-10-CM

## 2023-10-24 MED ORDER — ESCITALOPRAM OXALATE 10 MG PO TABS
10.0000 mg | ORAL_TABLET | Freq: Every day | ORAL | 1 refills | Status: DC
  Filled 2023-10-24 – 2023-11-19 (×2): qty 30, 30d supply, fill #0
  Filled 2023-12-23: qty 30, 30d supply, fill #1

## 2023-10-24 NOTE — Assessment & Plan Note (Addendum)
Chronic, improving but not controlled. She is currently on Lexapro 10mg  daily and has experienced side effects such as headaches and fatigue. She has seen some improvement in side effects by splitting the dose to 5mg  twice daily. We will reduce Lexapro to 5mg  daily and consider switching medications if headaches persist. Follow-up in 3 months or sooner with concerns. She can also start a probiotic or fiber supplement to help with loose stools since starting lexapro.

## 2023-10-24 NOTE — Progress Notes (Signed)
Established Patient Office Visit  Subjective   Patient ID: Savannah Cook, female    DOB: 1966/01/23  Age: 57 y.o. MRN: [id number]  Chief Complaint  Patient presents with   Depression    Follow up, Rx refill of Lexapro    HPI  Discussed the use of AI scribe software for clinical note transcription with the patient, who gave verbal consent to proceed.  History of Present Illness   The patient, with a history of anxiety and stress management, presents with complaints of headaches and fatigue. She reports that the headaches were severe initially after starting Lexapro 10mg , but have since improved to a dull ache. She also reports significant fatigue, which has improved slightly but still persists. She has been splitting the Lexapro dose to 5mg  in the morning and 5mg  at night, which seems to have helped with the side effects.  She also expresses concerns about essential tremors, as there is a family history of the condition. She reports occasional tremors but is unsure if it is related to stress or a sign of the onset of essential tremors. They occur only in her right hand and she can control them if she focuses.   Additionally, she reports some digestive issues, with changes in stool color and consistency since starting Lexapro. She is considering adding a fiber supplement or probiotic to her regimen to help with these issues.  She also mentions concerns about energy levels and is considering adding a B12 supplement to her regimen. She is currently taking Vitamin D supplements.        09/25/2023    4:38 PM 05/17/2023    3:58 PM 06/04/2022    2:23 PM 01/31/2022    8:48 AM 01/13/2021    3:54 PM  Depression screen PHQ 2/9  Decreased Interest 2 0 2 0 2  Down, Depressed, Hopeless 2 0 2 0 2  PHQ - 2 Score 4 0 4 0 4  Altered sleeping 2 0 2 0 1  Tired, decreased energy 2 0 1 2 3   Change in appetite 0 0 1 1 1   Feeling bad or failure about yourself  2 0 2 0 2  Trouble concentrating 1 0 1 0 2   Moving slowly or fidgety/restless 0 0 1 1 2   Suicidal thoughts 0 0 0 0 0  PHQ-9 Score 11 0 12 4 15   Difficult doing work/chores Very difficult Not difficult at all Somewhat difficult Not difficult at all Not difficult at all      09/25/2023    4:38 PM 05/17/2023    3:58 PM  GAD 7 : Generalized Anxiety Score  Nervous, Anxious, on Edge 3 0  Control/stop worrying 2 0  Worry too much - different things 3 0  Trouble relaxing 2 0  Restless 0 0  Easily annoyed or irritable 0 0  Afraid - awful might happen 1 0  Total GAD 7 Score 11 0  Anxiety Difficulty Somewhat difficult Not difficult at all    ROS See pertinent positives and negatives per HPI.    Objective:     BP 120/78 (BP Location: Left Arm)   Pulse 67   Temp (!) 97.4 F (36.3 C)   Ht 5\' 2"  (1.575 m)   Wt 201 lb 6.4 oz (91.4 kg)   LMP 10/06/2022 (Exact Date) Comment: Last cycle was in 07/2021  SpO2 97%   BMI 36.84 kg/m     Physical Exam Vitals and nursing note reviewed.  Constitutional:  General: She is not in acute distress.    Appearance: Normal appearance.  HENT:     Head: Normocephalic.  Eyes:     Conjunctiva/sclera: Conjunctivae normal.  Cardiovascular:     Rate and Rhythm: Normal rate and regular rhythm.     Pulses: Normal pulses.     Heart sounds: Normal heart sounds.  Pulmonary:     Effort: Pulmonary effort is normal.     Breath sounds: Normal breath sounds.  Musculoskeletal:     Cervical back: Normal range of motion.  Skin:    General: Skin is warm.  Neurological:     General: No focal deficit present.     Mental Status: She is alert and oriented to person, place, and time.  Psychiatric:        Mood and Affect: Mood normal.        Behavior: Behavior normal.        Thought Content: Thought content normal.        Judgment: Judgment normal.    The 10-year ASCVD risk score (Arnett DK, et al., 2019) is: 3.1%    Assessment & Plan:   Problem List Items Addressed This Visit       Other    Anxiety and depression - Primary    Chronic, improving but not controlled. She is currently on Lexapro 10mg  daily and has experienced side effects such as headaches and fatigue. She has seen some improvement in side effects by splitting the dose to 5mg  twice daily. We will reduce Lexapro to 5mg  daily and consider switching medications if headaches persist. Follow-up in 3 months or sooner with concerns. She can also start a probiotic or fiber supplement to help with loose stools since starting lexapro.       Relevant Medications   escitalopram (LEXAPRO) 10 MG tablet   Tremor    She reports occasional hand tremors and has a family history of essential tremor. We advise her to monitor for worsening or spread of tremors and report any changes.        Return in about 3 months (around 01/24/2024) for Anxiety, Depression.    Gerre Scull, NP

## 2023-10-24 NOTE — Assessment & Plan Note (Signed)
She reports occasional hand tremors and has a family history of essential tremor. We advise her to monitor for worsening or spread of tremors and report any changes.

## 2023-10-24 NOTE — Patient Instructions (Addendum)
It was great to see you!  Decrease your lexapro to 5 mg daily and see how your mood and headaches do.   Start a fiber supplement or probiotic daily. Make sure you are drinking plenty of water  You can try estroven if you are interested in something to help with menopausal symptoms.   Let's follow-up in 3 months, sooner if you have concerns.  If a referral was placed today, you will be contacted for an appointment. Please note that routine referrals can sometimes take up to 3-4 weeks to process. Please call our office if you haven't heard anything after this time frame.  Take care,  Rodman Pickle, NP

## 2023-11-01 ENCOUNTER — Encounter: Payer: BC Managed Care – PPO | Admitting: Nurse Practitioner

## 2023-11-19 ENCOUNTER — Other Ambulatory Visit (HOSPITAL_BASED_OUTPATIENT_CLINIC_OR_DEPARTMENT_OTHER): Payer: Self-pay

## 2023-12-09 ENCOUNTER — Telehealth: Payer: Self-pay | Admitting: Nurse Practitioner

## 2023-12-09 NOTE — Telephone Encounter (Signed)
error 

## 2023-12-23 ENCOUNTER — Other Ambulatory Visit (HOSPITAL_BASED_OUTPATIENT_CLINIC_OR_DEPARTMENT_OTHER): Payer: Self-pay

## 2023-12-23 DIAGNOSIS — M65331 Trigger finger, right middle finger: Secondary | ICD-10-CM | POA: Diagnosis not present

## 2023-12-23 MED ORDER — IBUPROFEN 800 MG PO TABS
800.0000 mg | ORAL_TABLET | Freq: Two times a day (BID) | ORAL | 0 refills | Status: DC | PRN
  Filled 2023-12-23: qty 60, 30d supply, fill #0

## 2024-01-06 ENCOUNTER — Other Ambulatory Visit (HOSPITAL_BASED_OUTPATIENT_CLINIC_OR_DEPARTMENT_OTHER): Payer: Self-pay

## 2024-01-23 ENCOUNTER — Ambulatory Visit: Payer: BC Managed Care – PPO | Admitting: Nurse Practitioner

## 2024-02-27 ENCOUNTER — Ambulatory Visit: Payer: BC Managed Care – PPO | Admitting: Nurse Practitioner

## 2024-02-27 ENCOUNTER — Encounter: Payer: Self-pay | Admitting: Nurse Practitioner

## 2024-02-27 ENCOUNTER — Other Ambulatory Visit (HOSPITAL_BASED_OUTPATIENT_CLINIC_OR_DEPARTMENT_OTHER): Payer: Self-pay

## 2024-02-27 VITALS — BP 124/82 | HR 68 | Temp 97.8°F | Ht 62.0 in | Wt 204.0 lb

## 2024-02-27 DIAGNOSIS — R221 Localized swelling, mass and lump, neck: Secondary | ICD-10-CM

## 2024-02-27 DIAGNOSIS — R7303 Prediabetes: Secondary | ICD-10-CM

## 2024-02-27 DIAGNOSIS — F32A Depression, unspecified: Secondary | ICD-10-CM

## 2024-02-27 DIAGNOSIS — J302 Other seasonal allergic rhinitis: Secondary | ICD-10-CM | POA: Insufficient documentation

## 2024-02-27 DIAGNOSIS — F419 Anxiety disorder, unspecified: Secondary | ICD-10-CM | POA: Diagnosis not present

## 2024-02-27 DIAGNOSIS — I480 Paroxysmal atrial fibrillation: Secondary | ICD-10-CM

## 2024-02-27 DIAGNOSIS — R0683 Snoring: Secondary | ICD-10-CM | POA: Diagnosis not present

## 2024-02-27 LAB — POCT GLYCOSYLATED HEMOGLOBIN (HGB A1C)
HbA1c POC (<> result, manual entry): 5.9 % (ref 4.0–5.6)
HbA1c, POC (controlled diabetic range): 5.9 % (ref 0.0–7.0)
HbA1c, POC (prediabetic range): 5.9 % (ref 5.7–6.4)
Hemoglobin A1C: 5.9 % — AB (ref 4.0–5.6)

## 2024-02-27 MED ORDER — ESCITALOPRAM OXALATE 10 MG PO TABS: 10.0000 mg | ORAL_TABLET | Freq: Every day | ORAL | 1 refills | Status: DC

## 2024-02-27 MED ORDER — AZELASTINE HCL 0.1 % NA SOLN: 1.0000 | Freq: Two times a day (BID) | NASAL | 3 refills | Status: DC

## 2024-02-27 MED ORDER — FEXOFENADINE HCL 180 MG PO TABS
180.0000 mg | ORAL_TABLET | Freq: Every day | ORAL | 1 refills | Status: DC
Start: 2024-02-27 — End: 2024-04-02

## 2024-02-27 NOTE — Assessment & Plan Note (Signed)
Chronic, stable.  She is currently following with cardiology.  She currently takes diltiazem 120 mg daily.  She is not taking any blood thinners at this time.  Continue collaboration recommendations from cardiology.

## 2024-02-27 NOTE — Assessment & Plan Note (Signed)
 Her A1c is currently 5.9, improved from 6.0. While metformin was discussed, emphasis is placed on lifestyle modifications due to potential side effects. Recommend lifestyle modifications focusing on diet and exercise.

## 2024-02-27 NOTE — Assessment & Plan Note (Signed)
 Significant allergy symptoms are managed with Flonase and Allegra. Azelastine was discussed for nasal symptoms. Prescribe Allegra for daytime allergy management and azelastine for nasal symptoms. Provide printed prescriptions for Allegra and other medications.

## 2024-02-27 NOTE — Assessment & Plan Note (Signed)
 BMI 37.3 with a-fib and prediabetes. Focus on healthy eating and exercise.

## 2024-02-27 NOTE — Progress Notes (Signed)
 d  Established Patient Office Visit  Subjective   Patient ID: Savannah Cook, female    DOB: [birth year]  Age: 58 y.o. MRN: [id number]  Chief Complaint  Patient presents with   Anxiety and depression    Follow up, Rx refills    HPI  Discussed the use of AI scribe software for clinical note transcription with the patient, who gave verbal consent to proceed.  History of Present Illness   The patient, with a history of AFib, depression and anxiety managed with Lexapro, presents with concerns about weight gain and potential prediabetes. The patient acknowledges a tendency to stress eat, particularly when feeling better, and has noticed an increase in weight. The patient has a family history of diabetes and was previously identified as prediabetic. The patient also mentions occasional heart palpitations, which she manages with Cardizem as needed. She denies chest pain and shortness of breath.   The patient also expresses concerns about a raised tissue area on the left tonsil, identified by a dentist, but reports no associated pain or discomfort. The patient is interested in a sleep apnea test due to weight gain and a history of AFib. Her significant other also tells her that she snores.   The patient also reports seasonal allergies, which have been particularly bothersome recently. The patient requests a refill of Allegra and Azelastine, which she finds effective in managing her symptoms.        02/27/2024    4:45 PM 09/25/2023    4:38 PM 05/17/2023    3:58 PM 06/04/2022    2:23 PM 01/31/2022    8:48 AM  Depression screen PHQ 2/9  Decreased Interest 0 2 0 2 0  Down, Depressed, Hopeless 0 2 0 2 0  PHQ - 2 Score 0 4 0 4 0  Altered sleeping 0 2 0 2 0  Tired, decreased energy 0 2 0 1 2  Change in appetite 1 0 0 1 1  Feeling bad or failure about yourself  0 2 0 2 0  Trouble concentrating 0 1 0 1 0  Moving slowly or fidgety/restless 0 0 0 1 1  Suicidal thoughts 0 0 0 0 0  PHQ-9 Score 1 11 0 12  4  Difficult doing work/chores Not difficult at all Very difficult Not difficult at all Somewhat difficult Not difficult at all      02/27/2024    4:46 PM 09/25/2023    4:38 PM 05/17/2023    3:58 PM  GAD 7 : Generalized Anxiety Score  Nervous, Anxious, on Edge 0 3 0  Control/stop worrying 0 2 0  Worry too much - different things 0 3 0  Trouble relaxing 2 2 0  Restless 1 0 0  Easily annoyed or irritable 0 0 0  Afraid - awful might happen 0 1 0  Total GAD 7 Score 3 11 0  Anxiety Difficulty Not difficult at all Somewhat difficult Not difficult at all    ROS See pertinent positives and negatives per HPI.    Objective:     BP 124/82 (BP Location: Left Arm, Patient Position: Sitting, Cuff Size: Normal)   Pulse 68   Temp 97.8 F (36.6 C)   Ht 5\' 2"  (1.575 m)   Wt 204 lb (92.5 kg)   LMP 10/06/2022 (Exact Date) Comment: Last cycle was in 07/2021  SpO2 97%   BMI 37.31 kg/m  BP Readings from Last 3 Encounters:  02/27/24 124/82  10/24/23 120/78  09/25/23 114/80  Wt Readings from Last 3 Encounters:  02/27/24 204 lb (92.5 kg)  10/24/23 201 lb 6.4 oz (91.4 kg)  09/25/23 199 lb 9.6 oz (90.5 kg)      Physical Exam Vitals and nursing note reviewed.  Constitutional:      General: She is not in acute distress.    Appearance: Normal appearance.  HENT:     Head: Normocephalic.     Mouth/Throat:     Comments: Raised, red area in front of left tonsil Eyes:     Conjunctiva/sclera: Conjunctivae normal.  Cardiovascular:     Rate and Rhythm: Normal rate and regular rhythm.     Pulses: Normal pulses.     Heart sounds: Normal heart sounds.  Pulmonary:     Effort: Pulmonary effort is normal.     Breath sounds: Normal breath sounds.  Musculoskeletal:     Cervical back: Normal range of motion.  Skin:    General: Skin is warm.  Neurological:     General: No focal deficit present.     Mental Status: She is alert and oriented to person, place, and time.  Psychiatric:        Mood  and Affect: Mood normal.        Behavior: Behavior normal.        Thought Content: Thought content normal.        Judgment: Judgment normal.      Results for orders placed or performed in visit on 02/27/24  POCT glycosylated hemoglobin (Hb A1C)  Result Value Ref Range   Hemoglobin A1C 5.9 (A) 4.0 - 5.6 %   HbA1c POC (<> result, manual entry) 5.9 4.0 - 5.6 %   HbA1c, POC (prediabetic range) 5.9 5.7 - 6.4 %   HbA1c, POC (controlled diabetic range) 5.9 0.0 - 7.0 %       The 10-year ASCVD risk score (Arnett DK, et al., 2019) is: 3.4%    Assessment & Plan:   Problem List Items Addressed This Visit       Cardiovascular and Mediastinum   Paroxysmal atrial fibrillation (HCC)   Chronic, stable.  She is currently following with cardiology.  She currently takes diltiazem 120 mg daily.  She is not taking any blood thinners at this time.  Continue collaboration recommendations from cardiology.         Other   Morbid obesity (HCC)   BMI 37.3 with a-fib and prediabetes. Focus on healthy eating and exercise.       Anxiety and depression   Chronic, stable. Continue lexapro 5mg  daily.       Relevant Medications   escitalopram (LEXAPRO) 10 MG tablet   Prediabetes - Primary   Her A1c is currently 5.9, improved from 6.0. While metformin was discussed, emphasis is placed on lifestyle modifications due to potential side effects. Recommend lifestyle modifications focusing on diet and exercise.      Relevant Orders   POCT glycosylated hemoglobin (Hb A1C) (Completed)   Seasonal allergies   Significant allergy symptoms are managed with Flonase and Allegra. Azelastine was discussed for nasal symptoms. Prescribe Allegra for daytime allergy management and azelastine for nasal symptoms. Provide printed prescriptions for Allegra and other medications.      Relevant Medications   fexofenadine (ALLEGRA ALLERGY) 180 MG tablet   Other Visit Diagnoses       Snoring       With snoring, weight  gain, and a-fib, will refer to a sleep medicine specialist for home sleep study evaluation.  Relevant Orders   Ambulatory referral to Sleep Studies     Lump in throat       Raised tissue in the left tonsil area was noted by the dentist. Refer to an ENT specialist for evaluation of the left tonsil area raised tissue.   Relevant Orders   Ambulatory referral to ENT       Return in about 2 months (around 04/28/2024) for CPE.    Gerre Scull, NP

## 2024-02-27 NOTE — Patient Instructions (Addendum)
 It was great to see you!  I have refilled your lexapro  I have placed a referral to sleep medicine for you  Call your cardiologist to move up your appointment  I have placed a referral to ENT  Let's follow-up in 2 months, sooner if you have concerns.  If a referral was placed today, you will be contacted for an appointment. Please note that routine referrals can sometimes take up to 3-4 weeks to process. Please call our office if you haven't heard anything after this time frame.  Take care,  Rodman Pickle, NP

## 2024-02-27 NOTE — Assessment & Plan Note (Signed)
 Chronic, stable. Continue lexapro 5mg  daily.

## 2024-02-28 ENCOUNTER — Other Ambulatory Visit (HOSPITAL_BASED_OUTPATIENT_CLINIC_OR_DEPARTMENT_OTHER): Payer: Self-pay

## 2024-02-28 MED ORDER — FEXOFENADINE HCL 180 MG PO TABS
180.0000 mg | ORAL_TABLET | Freq: Every day | ORAL | 1 refills | Status: DC
  Filled 2024-02-28: qty 30, 30d supply, fill #0

## 2024-02-28 MED ORDER — ESCITALOPRAM OXALATE 10 MG PO TABS
10.0000 mg | ORAL_TABLET | Freq: Every day | ORAL | 1 refills | Status: DC
  Filled 2024-02-28: qty 90, 90d supply, fill #0
  Filled 2024-08-04: qty 90, 90d supply, fill #1

## 2024-02-28 MED ORDER — AZELASTINE HCL 0.1 % NA SOLN
1.0000 | Freq: Two times a day (BID) | NASAL | 3 refills | Status: DC
  Filled 2024-02-28: qty 30, 30d supply, fill #0

## 2024-03-05 ENCOUNTER — Telehealth: Payer: Self-pay | Admitting: Nurse Practitioner

## 2024-03-09 ENCOUNTER — Telehealth: Payer: Self-pay | Admitting: Internal Medicine

## 2024-03-09 NOTE — Telephone Encounter (Signed)
 Good afternoon Dr. Leone Payor, I received a call from this patient stating that she would like to speak to you in regards to having a colonoscopy procedure schedule for the end of may this year. Patient stated that he company is currently up for sale and starting June she will not know if she will have a job left or insurance coverage. Would you please advise on to scheduling.    Thank you.

## 2024-03-09 NOTE — Telephone Encounter (Signed)
 She is not due until October 2027 and though I understand her concerns I would not recommend doing it this year

## 2024-03-10 NOTE — Telephone Encounter (Signed)
 I left Savannah Cook a detailed voice mail message with his response and said to call back with any other concerns or questions.

## 2024-03-10 NOTE — Telephone Encounter (Signed)
Patient returned call, requesting a call back. Please advise. °

## 2024-03-10 NOTE — Telephone Encounter (Signed)
I left her a message to call me back. 

## 2024-03-19 ENCOUNTER — Telehealth: Payer: Self-pay

## 2024-03-19 NOTE — Telephone Encounter (Signed)
 Copied from CRM [id number]. Topic: Appointments - Scheduling Inquiry for Clinic >> Mar 19, 2024 11:58 AM [place] J wrote: Reason for CRM: Patient wanted to ask if the clinic could look out for any cancellations for May 1st or the month of April with Rodman Pickle. If so, please call with an update. Patient doesn't want any changes made without an update with her first.

## 2024-03-19 NOTE — Telephone Encounter (Signed)
 Please review message.  Thanks. Dm/cma

## 2024-03-19 NOTE — Telephone Encounter (Signed)
 Lvmtcb to move cpe appt.

## 2024-03-20 NOTE — Telephone Encounter (Signed)
 I was able to reach Savannah Cook and we went over her colonoscopy recall. She had thought it was a 3 year recall.

## 2024-04-02 ENCOUNTER — Ambulatory Visit (INDEPENDENT_AMBULATORY_CARE_PROVIDER_SITE_OTHER): Admitting: Nurse Practitioner

## 2024-04-02 ENCOUNTER — Encounter: Payer: Self-pay | Admitting: Nurse Practitioner

## 2024-04-02 VITALS — BP 112/74 | HR 75 | Temp 97.2°F | Ht 62.0 in | Wt 209.8 lb

## 2024-04-02 DIAGNOSIS — F419 Anxiety disorder, unspecified: Secondary | ICD-10-CM

## 2024-04-02 DIAGNOSIS — E782 Mixed hyperlipidemia: Secondary | ICD-10-CM

## 2024-04-02 DIAGNOSIS — Z1159 Encounter for screening for other viral diseases: Secondary | ICD-10-CM | POA: Diagnosis not present

## 2024-04-02 DIAGNOSIS — E559 Vitamin D deficiency, unspecified: Secondary | ICD-10-CM

## 2024-04-02 DIAGNOSIS — D509 Iron deficiency anemia, unspecified: Secondary | ICD-10-CM

## 2024-04-02 DIAGNOSIS — I480 Paroxysmal atrial fibrillation: Secondary | ICD-10-CM

## 2024-04-02 DIAGNOSIS — Z0000 Encounter for general adult medical examination without abnormal findings: Secondary | ICD-10-CM

## 2024-04-02 DIAGNOSIS — R7303 Prediabetes: Secondary | ICD-10-CM | POA: Diagnosis not present

## 2024-04-02 DIAGNOSIS — Z23 Encounter for immunization: Secondary | ICD-10-CM

## 2024-04-02 DIAGNOSIS — F32A Depression, unspecified: Secondary | ICD-10-CM

## 2024-04-02 DIAGNOSIS — Z114 Encounter for screening for human immunodeficiency virus [HIV]: Secondary | ICD-10-CM

## 2024-04-02 NOTE — Assessment & Plan Note (Signed)
Health maintenance reviewed and updated. Discussed nutrition, exercise. Follow-up 1 year.

## 2024-04-02 NOTE — Assessment & Plan Note (Signed)
 Chronic, stable. Continue lexapro 5mg  daily.

## 2024-04-02 NOTE — Assessment & Plan Note (Signed)
Chronic, stable.  Will check CMP, CBC, lipid panel today and treat based on results.

## 2024-04-02 NOTE — Assessment & Plan Note (Signed)
Check CBC and iron panel today.  

## 2024-04-02 NOTE — Patient Instructions (Signed)
It was great to see you!  We are checking your labs today and will let you know the results via mychart/phone.   Let's follow-up in 1 year, sooner if you have concerns.  If a referral was placed today, you will be contacted for an appointment. Please note that routine referrals can sometimes take up to 3-4 weeks to process. Please call our office if you haven't heard anything after this time frame.  Take care,  Naina Sleeper, NP  

## 2024-04-02 NOTE — Progress Notes (Signed)
 BP 112/74 (BP Location: Left Arm, Patient Position: Sitting, Cuff Size: Normal)   Pulse 75   Temp (!) 97.2 F (36.2 C)   Ht 5\' 2"  (1.575 m)   Wt 209 lb 12.8 oz (95.2 kg)   LMP 10/06/2022 (Exact Date) Comment: Last cycle was in 07/2021  SpO2 97%   BMI 38.37 kg/m    Subjective:    Patient ID: Savannah Cook, female    DOB: 1966/10/19, 58 y.o.   MRN: [id number]  CC: Chief Complaint  Patient presents with   Annual Exam    With lab work-patient is not fasting    HPI: Savannah Cook is a 58 y.o. female presenting on 04/02/2024 for comprehensive medical examination. Current medical complaints include:none  She currently lives with: husband Menopausal Symptoms: no  Depression and Anxiety Screen done today and results listed below:     04/02/2024    3:53 PM 02/27/2024    4:45 PM 09/25/2023    4:38 PM 05/17/2023    3:58 PM 06/04/2022    2:23 PM  Depression screen PHQ 2/9  Decreased Interest 0 0 2 0 2  Down, Depressed, Hopeless 0 0 2 0 2  PHQ - 2 Score 0 0 4 0 4  Altered sleeping 0 0 2 0 2  Tired, decreased energy 0 0 2 0 1  Change in appetite 2 1 0 0 1  Feeling bad or failure about yourself  0 0 2 0 2  Trouble concentrating 0 0 1 0 1  Moving slowly or fidgety/restless 0 0 0 0 1  Suicidal thoughts 0 0 0 0 0  PHQ-9 Score 2 1 11  0 12  Difficult doing work/chores Somewhat difficult Not difficult at all Very difficult Not difficult at all Somewhat difficult      04/02/2024    3:53 PM 02/27/2024    4:46 PM 09/25/2023    4:38 PM 05/17/2023    3:58 PM  GAD 7 : Generalized Anxiety Score  Nervous, Anxious, on Edge 0 0 3 0  Control/stop worrying 0 0 2 0  Worry too much - different things 1 0 3 0  Trouble relaxing 0 2 2 0  Restless 0 1 0 0  Easily annoyed or irritable 0 0 0 0  Afraid - awful might happen 0 0 1 0  Total GAD 7 Score 1 3 11  0  Anxiety Difficulty Not difficult at all Not difficult at all Somewhat difficult Not difficult at all    The patient does not have a history  of falls. I did not complete a risk assessment for falls. A plan of care for falls was not documented.   Past Medical History:  Past Medical History:  Diagnosis Date   Anxiety    hx of, no medications now   Atrial fibrillation (HCC)    Atrial flutter (HCC)    Colon cancer (HCC) 06/10/2017   GERD (gastroesophageal reflux disease)    Hx of colonic polyp - sessile serrated 10/08/2018   Iron deficiency anemia due to chronic blood loss    colon cancer   Seasonal allergies    Shingles 11/2021    Surgical History:  Past Surgical History:  Procedure Laterality Date   CHOLECYSTECTOMY N/A 07/25/2017   Procedure: CHOLECYSTECTOMY;  Surgeon: Avel Peace, MD;  Location: [facility];  Service: General;  Laterality: N/A;   COLONOSCOPY  06/10/2018   w/Dr.Gessner    LAPAROSCOPIC PARTIAL COLECTOMY N/A 07/25/2017   Procedure: LAPAROSCOPIC PARTIAL COLECTOMY;  Surgeon: Adalberto Hollow, MD;  Location: [facility];  Service: General;  Laterality: N/A;   UPPER GASTROINTESTINAL ENDOSCOPY  06/10/2018   WISDOM TOOTH EXTRACTION     WRIST SURGERY Right 04/2012    Medications:  Current Outpatient Medications on File Prior to Visit  Medication Sig   acetaminophen (TYLENOL) 500 MG tablet Take 500 mg by mouth every 6 (six) hours as needed for moderate pain, fever, headache or mild pain.   ASHWAGANDHA PO Take by mouth as needed.   azelastine (ASTELIN) 0.1 % nasal spray Place 1 spray into both nostrils 2 (two) times daily. Use in each nostril as directed   azelastine (ASTELIN) 0.1 % nasal spray Place 1 spray into both nostrils 2 (two) times daily. Use in each nostril as directed   Cholecalciferol (VITAMIN D3 PO) Take 2,000 Units by mouth daily.   Collagen-Vitamin C (COLLAGEN PLUS VITAMIN C PO) Take 1 tablet by mouth every other day.   diltiazem (CARDIZEM CD) 120 MG 24 hr capsule Take 1 capsule (120 mg total) by mouth daily.   ELDERBERRY PO Take by mouth. Every day  2000 mg   escitalopram (LEXAPRO) 10 MG tablet Take  1 tablet (10 mg total) by mouth daily.   fexofenadine (FT ALLERGY RELIEF 24 HOUR) 180 MG tablet Take 1 tablet (180 mg total) by mouth daily.   ibuprofen (ADVIL) 800 MG tablet Take 1 tablet (800 mg total) by mouth 2 (two) times daily as needed for pain.   L-THEANINE PO Take by mouth as needed.   magnesium oxide (MAG-OX) 400 (240 Mg) MG tablet Take 400 mg by mouth every other day.   Multiple Vitamin (MULTIVITAMIN ADULT PO) Take 1 tablet by mouth every morning. One daily- Every Woman's 55 plus- [place] [facility]   No current facility-administered medications on file prior to visit.    Allergies:  Allergies  Allergen Reactions   Ceclor [Cefaclor] Rash    Social History:  Social History   Socioeconomic History   Marital status: Significant Other    Spouse name: Not on file   Number of children: 1   Years of education: Not on file   Highest education level: Not on file  Occupational History   Occupation: Arts development officer, Social worker apartments  Tobacco Use   Smoking status: Former    Current packs/day: 0.00    Types: Cigarettes    Quit date: 12/17/1985    Years since quitting: 38.3   Smokeless tobacco: Never   Tobacco comments:    Former smoker 04/19/22  Vaping Use   Vaping status: Never Used  Substance and Sexual Activity   Alcohol use: Yes    Alcohol/week: 1.0 standard drink of alcohol    Types: 1 Standard drinks or equivalent per week    Comment: Rare   Drug use: No   Sexual activity: Yes    Birth control/protection: Post-menopausal, Other-see comments    Comment: VAS  Other Topics Concern   Not on file  Social History Narrative   The patient is single, used to live w/ a partner until 11-2020   One daughter   is employed as a Arts development officer   2 caffeinated beverages a day   Social Drivers of Corporate investment banker Strain: Not on file  Food Insecurity: Not on file  Transportation Needs: Not on file  Physical Activity: Not on file  Stress: Not on file  Social  Connections: Not on file  Intimate Partner Violence: Not on file   Social History  Tobacco Use  Smoking Status Former   Current packs/day: 0.00   Types: Cigarettes   Quit date: 12/17/1985   Years since quitting: 38.3  Smokeless Tobacco Never  Tobacco Comments   Former smoker 04/19/22   Social History   Substance and Sexual Activity  Alcohol Use Yes   Alcohol/week: 1.0 standard drink of alcohol   Types: 1 Standard drinks or equivalent per week   Comment: Rare    Family History:  Family History  Problem Relation Age of Onset   Diabetes Mother    Hypertension Mother    Bipolar disorder Mother    COPD Mother    Diabetes Father    Hypertension Father    Lung cancer Father    Tremor Father        essential   Heart disease Maternal Grandmother    Colon cancer Maternal Grandfather 95   Colon polyps Neg Hx    Esophageal cancer Neg Hx    Rectal cancer Neg Hx    Stomach cancer Neg Hx     Past medical history, surgical history, medications, allergies, family history and social history reviewed with patient today and changes made to appropriate areas of the chart.   Review of Systems  Constitutional: Negative.   HENT: Negative.    Eyes: Negative.   Respiratory: Negative.    Cardiovascular: Negative.   Gastrointestinal: Negative.   Genitourinary: Negative.   Musculoskeletal:  Positive for joint pain (stiffness).  Skin: Negative.   Neurological: Negative.   Psychiatric/Behavioral: Negative.     All other ROS negative except what is listed above and in the HPI.      Objective:    BP 112/74 (BP Location: Left Arm, Patient Position: Sitting, Cuff Size: Normal)   Pulse 75   Temp (!) 97.2 F (36.2 C)   Ht 5\' 2"  (1.575 m)   Wt 209 lb 12.8 oz (95.2 kg)   LMP 10/06/2022 (Exact Date) Comment: Last cycle was in 07/2021  SpO2 97%   BMI 38.37 kg/m   Wt Readings from Last 3 Encounters:  04/02/24 209 lb 12.8 oz (95.2 kg)  02/27/24 204 lb (92.5 kg)  10/24/23 201 lb 6.4  oz (91.4 kg)    Physical Exam Vitals and nursing note reviewed.  Constitutional:      General: She is not in acute distress.    Appearance: Normal appearance.  HENT:     Head: Normocephalic and atraumatic.     Right Ear: Tympanic membrane, ear canal and external ear normal.     Left Ear: Tympanic membrane, ear canal and external ear normal.     Mouth/Throat:     Mouth: Mucous membranes are moist.     Pharynx: No posterior oropharyngeal erythema.  Eyes:     Conjunctiva/sclera: Conjunctivae normal.  Cardiovascular:     Rate and Rhythm: Normal rate and regular rhythm.     Pulses: Normal pulses.     Heart sounds: Normal heart sounds.  Pulmonary:     Effort: Pulmonary effort is normal.     Breath sounds: Normal breath sounds.  Abdominal:     Palpations: Abdomen is soft.     Tenderness: There is no abdominal tenderness.  Musculoskeletal:        General: Normal range of motion.     Cervical back: Normal range of motion and neck supple.     Right lower leg: No edema.     Left lower leg: No edema.  Lymphadenopathy:     Cervical:  No cervical adenopathy.  Skin:    General: Skin is warm and dry.  Neurological:     General: No focal deficit present.     Mental Status: She is alert and oriented to person, place, and time.     Cranial Nerves: No cranial nerve deficit.     Coordination: Coordination normal.     Gait: Gait normal.  Psychiatric:        Mood and Affect: Mood normal.        Behavior: Behavior normal.        Thought Content: Thought content normal.        Judgment: Judgment normal.     Results for orders placed or performed in visit on 02/27/24  POCT glycosylated hemoglobin (Hb A1C)   Collection Time: 02/27/24  4:25 PM  Result Value Ref Range   Hemoglobin A1C 5.9 (A) 4.0 - 5.6 %   HbA1c POC (<> result, manual entry) 5.9 4.0 - 5.6 %   HbA1c, POC (prediabetic range) 5.9 5.7 - 6.4 %   HbA1c, POC (controlled diabetic range) 5.9 0.0 - 7.0 %      Assessment & Plan:    Problem List Items Addressed This Visit       Cardiovascular and Mediastinum   Paroxysmal atrial fibrillation (HCC)   Chronic, stable.  She is currently following with cardiology.  She currently takes diltiazem 120 mg daily.  She is not taking any blood thinners at this time.  Continue collaboration recommendations from cardiology.         Other   Morbid obesity (HCC)   BMI 38.3 with a-fib and prediabetes. Focus on healthy eating and exercise.       Iron deficiency anemia   Check CBC and iron panel today.       Relevant Orders   Iron, TIBC and Ferritin Panel   Routine general medical examination at a health care facility - Primary   Health maintenance reviewed and updated. Discussed nutrition, exercise. Follow-up 1 year.        Anxiety and depression   Chronic, stable. Continue lexapro 5mg  daily.       Mixed hyperlipidemia   Chronic, stable.  Will check CMP, CBC, lipid panel today and treat based on results      Relevant Orders   CBC with Differential/Platelet   Comprehensive metabolic panel with GFR   Lipid panel   Prediabetes   Chronic, stable. Continue focus on nutrition and exercise.       Vitamin D deficiency   Relevant Orders   VITAMIN D 25 Hydroxy (Vit-D Deficiency, Fractures)   Other Visit Diagnoses       Screening for HIV (human immunodeficiency virus)       Screen HIV today   Relevant Orders   HIV Antibody (routine testing w rflx)     Encounter for hepatitis C screening test for low risk patient       Screen hepatitis C today   Relevant Orders   Hepatitis C antibody     Immunization due       Relevant Orders   Tdap vaccine greater than or equal to 7yo IM (Completed)        Follow up plan: Return in about 1 year (around 04/02/2025) for CPE.   LABORATORY TESTING:  - Pap smear: up to date  IMMUNIZATIONS:   - Tdap: Tetanus vaccination status reviewed: Td vaccination indicated and given today. - Influenza: Postponed to flu season -  Pneumovax: Not applicable -  Prevnar: Declined - HPV: Not applicable - Shingrix vaccine: Declined  SCREENING: -Mammogram: Up to date  - Colonoscopy: Up to date  - Bone Density: Not applicable   PATIENT COUNSELING:   Advised to take 1 mg of folate supplement per day if capable of pregnancy.   Sexuality: Discussed sexually transmitted diseases, partner selection, use of condoms, avoidance of unintended pregnancy  and contraceptive alternatives.   Advised to avoid cigarette smoking.  I discussed with the patient that most people either abstain from alcohol or drink within safe limits (<=14/week and <=4 drinks/occasion for males, <=7/weeks and <= 3 drinks/occasion for females) and that the risk for alcohol disorders and other health effects rises proportionally with the number of drinks per week and how often a drinker exceeds daily limits.  Discussed cessation/primary prevention of drug use and availability of treatment for abuse.   Diet: Encouraged to adjust caloric intake to maintain  or achieve ideal body weight, to reduce intake of dietary saturated fat and total fat, to limit sodium intake by avoiding high sodium foods and not adding table salt, and to maintain adequate dietary potassium and calcium preferably from fresh fruits, vegetables, and low-fat dairy products.    stressed the importance of regular exercise  Injury prevention: Discussed safety belts, safety helmets, smoke detector, smoking near bedding or upholstery.   Dental health: Discussed importance of regular tooth brushing, flossing, and dental visits.    NEXT PREVENTATIVE PHYSICAL DUE IN 1 YEAR. Return in about 1 year (around 04/02/2025) for CPE.  Cristiana Yochim A Angele Wiemann

## 2024-04-02 NOTE — Assessment & Plan Note (Signed)
Chronic, stable.  She is currently following with cardiology.  She currently takes diltiazem 120 mg daily.  She is not taking any blood thinners at this time.  Continue collaboration recommendations from cardiology.

## 2024-04-02 NOTE — Assessment & Plan Note (Signed)
 Chronic, stable. Continue focus on nutrition and exercise.

## 2024-04-02 NOTE — Assessment & Plan Note (Signed)
 BMI 38.3 with a-fib and prediabetes. Focus on healthy eating and exercise.

## 2024-04-03 LAB — CBC WITH DIFFERENTIAL/PLATELET
Absolute Lymphocytes: 2144 {cells}/uL (ref 850–3900)
Absolute Monocytes: 480 {cells}/uL (ref 200–950)
Basophils Absolute: 51 {cells}/uL (ref 0–200)
Basophils Relative: 0.8 %
Eosinophils Absolute: 230 {cells}/uL (ref 15–500)
Eosinophils Relative: 3.6 %
HCT: 42.7 % (ref 35.0–45.0)
Hemoglobin: 14.2 g/dL (ref 11.7–15.5)
MCH: 29 pg (ref 27.0–33.0)
MCHC: 33.3 g/dL (ref 32.0–36.0)
MCV: 87.1 fL (ref 80.0–100.0)
MPV: 11.5 fL (ref 7.5–12.5)
Monocytes Relative: 7.5 %
Neutro Abs: 3494 {cells}/uL (ref 1500–7800)
Neutrophils Relative %: 54.6 %
Platelets: 269 10*3/uL (ref 140–400)
RBC: 4.9 10*6/uL (ref 3.80–5.10)
RDW: 13 % (ref 11.0–15.0)
Total Lymphocyte: 33.5 %
WBC: 6.4 10*3/uL (ref 3.8–10.8)

## 2024-04-03 LAB — IRON,TIBC AND FERRITIN PANEL
%SAT: 20 % (ref 16–45)
Ferritin: 47 ng/mL (ref 16–232)
Iron: 69 ug/dL (ref 45–160)
TIBC: 343 ug/dL (ref 250–450)

## 2024-04-03 LAB — LIPID PANEL
Cholesterol: 217 mg/dL — ABNORMAL HIGH (ref <200)
HDL: 57 mg/dL (ref >=50)
LDL Cholesterol (Calc): 125 mg/dL — ABNORMAL HIGH
Non-HDL Cholesterol (Calc): 160 mg/dL — ABNORMAL HIGH (ref <130)
Total CHOL/HDL Ratio: 3.8 (calc) (ref <5.0)
Triglycerides: 210 mg/dL — ABNORMAL HIGH (ref <150)

## 2024-04-03 LAB — COMPREHENSIVE METABOLIC PANEL WITH GFR
AG Ratio: 1.5 (calc) (ref 1.0–2.5)
ALT: 21 U/L (ref 6–29)
AST: 19 U/L (ref 10–35)
Albumin: 4.4 g/dL (ref 3.6–5.1)
Alkaline phosphatase (APISO): 86 U/L (ref 37–153)
BUN: 21 mg/dL (ref 7–25)
CO2: 28 mmol/L (ref 20–32)
Calcium: 9.2 mg/dL (ref 8.6–10.4)
Chloride: 103 mmol/L (ref 98–110)
Creat: 0.69 mg/dL (ref 0.50–1.03)
Globulin: 2.9 g/dL (ref 1.9–3.7)
Glucose, Bld: 99 mg/dL (ref 65–99)
Potassium: 4 mmol/L (ref 3.5–5.3)
Sodium: 139 mmol/L (ref 135–146)
Total Bilirubin: 0.3 mg/dL (ref 0.2–1.2)
Total Protein: 7.3 g/dL (ref 6.1–8.1)
eGFR: 100 mL/min/{1.73_m2} (ref >=60)

## 2024-04-03 LAB — VITAMIN D 25 HYDROXY (VIT D DEFICIENCY, FRACTURES): Vit D, 25-Hydroxy: 52 ng/mL (ref 30–100)

## 2024-04-03 LAB — HIV ANTIBODY (ROUTINE TESTING W REFLEX): HIV 1&2 Ab, 4th Generation: NONREACTIVE

## 2024-04-03 LAB — HEPATITIS C ANTIBODY: Hepatitis C Ab: NONREACTIVE

## 2024-04-04 ENCOUNTER — Other Ambulatory Visit (HOSPITAL_BASED_OUTPATIENT_CLINIC_OR_DEPARTMENT_OTHER): Payer: Self-pay

## 2024-04-06 ENCOUNTER — Encounter: Payer: Self-pay | Admitting: Nurse Practitioner

## 2024-04-08 ENCOUNTER — Other Ambulatory Visit: Payer: Self-pay

## 2024-04-08 ENCOUNTER — Other Ambulatory Visit (HOSPITAL_BASED_OUTPATIENT_CLINIC_OR_DEPARTMENT_OTHER): Payer: Self-pay

## 2024-04-08 DIAGNOSIS — L578 Other skin changes due to chronic exposure to nonionizing radiation: Secondary | ICD-10-CM | POA: Diagnosis not present

## 2024-04-08 DIAGNOSIS — D225 Melanocytic nevi of trunk: Secondary | ICD-10-CM | POA: Diagnosis not present

## 2024-04-08 DIAGNOSIS — L821 Other seborrheic keratosis: Secondary | ICD-10-CM | POA: Diagnosis not present

## 2024-04-08 DIAGNOSIS — L814 Other melanin hyperpigmentation: Secondary | ICD-10-CM | POA: Diagnosis not present

## 2024-04-08 MED ORDER — TRIAMCINOLONE ACETONIDE 0.1 % EX CREA
1.0000 | TOPICAL_CREAM | Freq: Two times a day (BID) | CUTANEOUS | 0 refills | Status: AC
Start: 2024-04-08 — End: ?
  Filled 2024-04-08: qty 80, 14d supply, fill #0

## 2024-04-08 MED ORDER — CLINDAMYCIN PHOSPHATE 1 % EX SOLN
1.0000 | Freq: Every day | CUTANEOUS | 3 refills | Status: DC
  Filled 2024-04-08: qty 60, 90d supply, fill #0

## 2024-04-08 MED ORDER — METRONIDAZOLE 0.75 % EX CREA
1.0000 | TOPICAL_CREAM | Freq: Two times a day (BID) | CUTANEOUS | 2 refills | Status: DC
  Filled 2024-04-08: qty 45, 30d supply, fill #0

## 2024-04-09 ENCOUNTER — Institutional Professional Consult (permissible substitution) (INDEPENDENT_AMBULATORY_CARE_PROVIDER_SITE_OTHER): Admitting: Otolaryngology

## 2024-04-16 ENCOUNTER — Encounter: Admitting: Nurse Practitioner

## 2024-05-04 ENCOUNTER — Encounter: Payer: Self-pay | Admitting: Nurse Practitioner

## 2024-05-27 ENCOUNTER — Encounter (INDEPENDENT_AMBULATORY_CARE_PROVIDER_SITE_OTHER): Payer: Self-pay | Admitting: Otolaryngology

## 2024-05-27 ENCOUNTER — Ambulatory Visit (INDEPENDENT_AMBULATORY_CARE_PROVIDER_SITE_OTHER): Admitting: Otolaryngology

## 2024-05-27 VITALS — BP 118/77 | HR 82

## 2024-05-27 DIAGNOSIS — J351 Hypertrophy of tonsils: Secondary | ICD-10-CM | POA: Diagnosis not present

## 2024-05-27 DIAGNOSIS — R0982 Postnasal drip: Secondary | ICD-10-CM

## 2024-05-27 DIAGNOSIS — R0981 Nasal congestion: Secondary | ICD-10-CM

## 2024-05-27 DIAGNOSIS — K219 Gastro-esophageal reflux disease without esophagitis: Secondary | ICD-10-CM

## 2024-05-27 DIAGNOSIS — R09A2 Foreign body sensation, throat: Secondary | ICD-10-CM

## 2024-05-27 DIAGNOSIS — J3089 Other allergic rhinitis: Secondary | ICD-10-CM | POA: Diagnosis not present

## 2024-05-27 NOTE — Patient Instructions (Addendum)

## 2024-05-27 NOTE — Progress Notes (Signed)
 ENT CONSULT:  Reason for Consult: globus sensation concern for tonsillar enlargement on the left   HPI: Discussed the use of AI scribe software for clinical note transcription with the patient, who gave verbal consent to proceed.  History of Present Illness Savannah Cook is a 58 year old female who presents with a sensation of a lump in her throat. She was referred by her dentist for evaluation of raised tissue in the left tonsil area.  She experiences a sensation of a lump in her throat. There is no associated pain, and she has not undergone a tonsillectomy. No trouble swallowing.  She has a history of heartburn/reflux, for which she takes Pepcid  AC, and reports that it helps her symptoms. No significant swallowing difficulties are reported in relation to her reflux.  She experiences postnasal drainage, particularly during allergy season, and uses Allegra  as needed along with a prescription nasal spray, Astelin , to manage her symptoms. She sometimes feels postnasal drip in the back of her throat, which she associates with her allergies.    Past Medical History:  Diagnosis Date   Anxiety    hx of, no medications now   Atrial fibrillation (HCC)    Atrial flutter (HCC)    Colon cancer (HCC) 06/10/2017   GERD (gastroesophageal reflux disease)    Hx of colonic polyp - sessile serrated 10/08/2018   Iron deficiency anemia due to chronic blood loss    colon cancer   Seasonal allergies    Shingles 11/2021    Past Surgical History:  Procedure Laterality Date   CHOLECYSTECTOMY N/A 07/25/2017   Procedure: CHOLECYSTECTOMY;  Surgeon: Adalberto Hollow, MD;  Location: [facility];  Service: General;  Laterality: N/A;   COLONOSCOPY  06/10/2018   w/Dr.Gessner    LAPAROSCOPIC PARTIAL COLECTOMY N/A 07/25/2017   Procedure: LAPAROSCOPIC PARTIAL COLECTOMY;  Surgeon: Adalberto Hollow, MD;  Location: [facility];  Service: General;  Laterality: N/A;   UPPER GASTROINTESTINAL ENDOSCOPY  06/10/2018   WISDOM TOOTH  EXTRACTION     WRIST SURGERY Right 04/2012    Family History  Problem Relation Age of Onset   Diabetes Mother    Hypertension Mother    Bipolar disorder Mother    COPD Mother    Diabetes Father    Hypertension Father    Lung cancer Father    Tremor Father        essential   Heart disease Maternal Grandmother    Colon cancer Maternal Grandfather 95   Colon polyps Neg Hx    Esophageal cancer Neg Hx    Rectal cancer Neg Hx    Stomach cancer Neg Hx     Social History:  reports that she quit smoking about 38 years ago. Her smoking use included cigarettes. She has never used smokeless tobacco. She reports current alcohol use of about 1.0 standard drink of alcohol per week. She reports that she does not use drugs.  Allergies:  Allergies  Allergen Reactions   Ceclor [Cefaclor] Rash    Medications: I have reviewed the patient's current medications.  The PMH, PSH, Medications, Allergies, and SH were reviewed and updated.  ROS: Constitutional: Negative for fever, weight loss and weight gain. Cardiovascular: Negative for chest pain and dyspnea on exertion. Respiratory: Is not experiencing shortness of breath at rest. Gastrointestinal: Negative for nausea and vomiting. Neurological: Negative for headaches. Psychiatric: The patient is not nervous/anxious  Blood pressure 118/77, pulse 82, last menstrual period 10/06/2022, SpO2 97%. There is no height or weight on file to  calculate BMI.  PHYSICAL EXAM:  Exam: General: Well-developed, well-nourished Respiratory Respiratory effort: Equal inspiration and expiration without stridor Cardiovascular Peripheral Vascular: Warm extremities with equal color/perfusion Eyes: No nystagmus with equal extraocular motion bilaterally Neuro/Psych/Balance: Patient oriented to person, place, and time; Appropriate mood and affect; Gait is intact with no imbalance; Cranial nerves I-XII are intact Head and Face Inspection: Normocephalic and  atraumatic without mass or lesion Palpation: Facial skeleton intact without bony stepoffs Salivary Glands: No mass or tenderness Facial Strength: Facial motility symmetric and full bilaterally ENT Pinna: External ear intact and fully developed External canal: Canal is patent with intact skin Tympanic Membrane: Clear and mobile External Nose: No scar or anatomic deformity Internal Nose: Septum is relatively straight on anterior rhinoscopy.   Lips, Teeth, and gums: Mucosa and teeth intact and viable TMJ: No pain to palpation with full mobility Oral cavity/oropharynx: No erythema or exudate, no lesions present, residual tonsillar tissue b/l symmetric without masses or lesions Neck Neck and Trachea: Midline trachea without mass or lesion Thyroid : No mass or nodularity Lymphatics: No lymphadenopathy   Assessment/Plan: Encounter Diagnoses  Name Primary?   Globus sensation [R09.A2] Yes   Tonsillar hypertrophy    Chronic GERD    Environmental and seasonal allergies    Post-nasal drip    Chronic nasal congestion    Assessment & Plan Normal Tonsillar hypertrophy Small amount of tonsillar hypertrophy, symmetric without masses or lesions on exam  - patient was reassured   Environmental allergies chronic nasal congestion and post-nasal drainage  On Allegra  and Astelin .  - Continue Allegra  - Continue Astelin  nasal spray as prescribed. - Consider nasal irrigation with saline using [employer] bottle. - Consider switching antihistamine if current regimen is insufficient. - Offered allergy testing for potential immunotherapy but she would like to hold off.  GERD LPR - continue Pepcid  20 mg BID  -  Reflux Gourmet after meals - diet and lifestyle changes to minimize GERD - Refer to [employer] blog for dietary and lifestyle modifications/reflux cook book      Thank you for allowing me to participate in the care of this patient. Please do not hesitate to contact me with any questions  or concerns.   Artice Last, MD Otolaryngology [facility] Phone: [phone] Fax: [phone]    05/27/2024, 2:15 PM

## 2024-06-01 ENCOUNTER — Ambulatory Visit (INDEPENDENT_AMBULATORY_CARE_PROVIDER_SITE_OTHER): Admitting: Neurology

## 2024-06-01 VITALS — BP 128/84 | HR 62 | Ht 61.0 in | Wt 218.0 lb

## 2024-06-01 DIAGNOSIS — R292 Abnormal reflex: Secondary | ICD-10-CM | POA: Insufficient documentation

## 2024-06-01 DIAGNOSIS — M79609 Pain in unspecified limb: Secondary | ICD-10-CM | POA: Diagnosis not present

## 2024-06-01 DIAGNOSIS — K219 Gastro-esophageal reflux disease without esophagitis: Secondary | ICD-10-CM | POA: Insufficient documentation

## 2024-06-01 DIAGNOSIS — R519 Headache, unspecified: Secondary | ICD-10-CM | POA: Diagnosis not present

## 2024-06-01 DIAGNOSIS — R0681 Apnea, not elsewhere classified: Secondary | ICD-10-CM

## 2024-06-01 DIAGNOSIS — Z8261 Family history of arthritis: Secondary | ICD-10-CM | POA: Insufficient documentation

## 2024-06-01 DIAGNOSIS — R0683 Snoring: Secondary | ICD-10-CM | POA: Insufficient documentation

## 2024-06-01 DIAGNOSIS — I480 Paroxysmal atrial fibrillation: Secondary | ICD-10-CM

## 2024-06-01 DIAGNOSIS — R251 Tremor, unspecified: Secondary | ICD-10-CM

## 2024-06-01 NOTE — Progress Notes (Signed)
 SLEEP MED[facility]INE CLINIC    Provider:  Neomia Banner, MD  Primary Care Physician:  Odette Benjamin, NP [address] [place] [address]     Referring Provider: Odette Benjamin, Np [address] [place],  [place] [address]          Chief Complaint according to patient   Patient presents with:     New Patient (Initial Visit)           HISTORY OF PRESENT ILLNESS:  Savannah Cook is a 58 y.o. female patient who is seen upon  PCP NP McElwee's  referral on 06/01/2024 for a suspicion of sleep apnea.   .  Chief concern according to patient :   my daughter is a Human resources officer, she has seen my neck and shoulder move when I deep breathe, I am more tired, I snore loudly, and gained weight since 2008 when I had a PSG  at [facility] and  was dx with snoring and not with apnea.  I have been on allegra  for allergies.  I have been dx with atrial fibrillation. I have higher blood glucose, 5.9     She  had colon cancer in 2018,  operative , no chemo, no radiation,  but she felt achy and sore since. She has been told she sleeps with mouth open. She has been struggling with acid reflux. States that on avg 6-8 hrs of sleep and wakes up once to urinate     I have the pleasure of seeing Savannah Cook 06/01/24 a right-handed Caucasian female with a possible sleep disorder.    The patient had the first sleep study in the year 2008.    Sleep relevant medical history: Nocturia 0-1, no history of Sleep walking, no Tonsillectomy hx, TMJ crack,    Family medical /sleep history: DM,  no other family member on CPAP with OSA, MGF with colon cancer.   Social history:  Patient is working as Film/video editor, Therapist, sports.   and lives in a household with her BF, but sleeping in separate rooms.   persons/ alone. Family status is divorced , with adult children, 2 grandchildren.  The patient currently works office hours.  Pets are not present. Tobacco use; none.  ETOH use ;  on weekends 1-2 glasses a months. ,  Caffeine intake in form of Coffee( 2 cups ) Soda( /) Tea ( /) or energy drinks Exercise in form of walking          Sleep habits are as follows: The patient's dinner time is between 7-10.30 PM. The patient goes to bed at 10- 11 PM and continues to sleep for 6-7 hours, wakes for one bathroom breaks, the first time at 3 AM.   The preferred sleep position is left side , with the support of 2-4 pillows.  Dreams are reportedly frequent/vivid.  The patient wakes up spontaneously/ not with an alarm.  6.30  AM is the usual rise time. She reports not feeling refreshed or restored in AM, with symptoms such as dry mouth, morning headaches, and residual fatigue.  Naps are not taken = no opportunity, and not refreshing.    Review of Systems: Out of a complete 14 system review, the patient complains of only the following symptoms, and all other reviewed systems are negative.:   There is no associated pain, and she has not undergone a tonsillectomy. No trouble swallowing.   Stiffness, leg oedema,  Dull headaches in AM,  GERD, atrial fib  02-2022.  Paroxysmal-   Weight gain, BMI 41  Fatigue, sleepiness , snoring, Nocturia    How likely are you to doze in the following situations: 0 = not likely, 1 = slight chance, 2 = moderate chance, 3 = high chance   Sitting and Reading? Watching Television? Sitting inactive in a public place (theater or meeting)? As a passenger in a car for an hour without a break? Lying down in the afternoon when circumstances permit? Sitting and talking to someone? Sitting quietly after lunch without alcohol? In a car, while stopped for a few minutes in traffic?   Total = 10/ 24 points   FSS endorsed at 48/ 63 points.   Social History   Socioeconomic History   Marital status: Significant Other    Spouse name: Not on file   Number of children: 1   Years of education: Not on file   Highest education level: Not on file   Occupational History   Occupation: Arts development officer, Social worker apartments  Tobacco Use   Smoking status: Former    Current packs/day: 0.00    Types: Cigarettes    Quit date: 12/17/1985    Years since quitting: 38.4   Smokeless tobacco: Never   Tobacco comments:    Former smoker 04/19/22  Vaping Use   Vaping status: Never Used  Substance and Sexual Activity   Alcohol use: Yes    Alcohol/week: 1.0 standard drink of alcohol    Types: 1 Standard drinks or equivalent per week    Comment: Rare   Drug use: No   Sexual activity: Yes    Birth control/protection: Post-menopausal, Other-see comments    Comment: VAS  Other Topics Concern   Not on file  Social History Narrative   The patient is single, used to live w/ a partner until 11-2020   One daughter   is employed as a Arts development officer   2 caffeinated beverages a day   Social Drivers of Corporate investment banker Strain: Not on file  Food Insecurity: Not on file  Transportation Needs: Not on file  Physical Activity: Not on file  Stress: Not on file  Social Connections: Not on file    Family History  Problem Relation Age of Onset   Diabetes Mother    Hypertension Mother    Bipolar disorder Mother    COPD Mother    Diabetes Father    Hypertension Father    Lung cancer Father    Tremor Father        essential   Heart disease Maternal Grandmother    Colon cancer Maternal Grandfather 95   Colon polyps Neg Hx    Esophageal cancer Neg Hx    Rectal cancer Neg Hx    Stomach cancer Neg Hx     Past Medical History:  Diagnosis Date   Anxiety    hx of, no medications now   Atrial fibrillation (HCC)    Atrial flutter (HCC)    Colon cancer (HCC) 06/10/2017   GERD (gastroesophageal reflux disease)    Hx of colonic polyp - sessile serrated 10/08/2018   Iron deficiency anemia due to chronic blood loss    colon cancer   Seasonal allergies    Shingles 11/2021    Past Surgical History:  Procedure Laterality Date    CHOLECYSTECTOMY N/A 07/25/2017   Procedure: CHOLECYSTECTOMY;  Surgeon: Adalberto Hollow, MD;  Location: [facility];  Service: General;  Laterality: N/A;   COLONOSCOPY  06/10/2018  w/Dr.Gessner    LAPAROSCOPIC PARTIAL COLECTOMY N/A 07/25/2017   Procedure: LAPAROSCOPIC PARTIAL COLECTOMY;  Surgeon: Adalberto Hollow, MD;  Location: [facility];  Service: General;  Laterality: N/A;   UPPER GASTROINTESTINAL ENDOSCOPY  06/10/2018   WISDOM TOOTH EXTRACTION     WRIST SURGERY Right 04/2012     Current Outpatient Medications on File Prior to Visit  Medication Sig Dispense Refill   acetaminophen  (TYLENOL ) 500 MG tablet Take 500 mg by mouth every 6 (six) hours as needed for moderate pain, fever, headache or mild pain.     ASHWAGANDHA PO Take by mouth as needed.     azelastine  (ASTELIN ) 0.1 % nasal spray Place 1 spray into both nostrils 2 (two) times daily. Use in each nostril as directed (Patient taking differently: Place 1 spray into both nostrils 2 (two) times daily as needed for allergies or rhinitis. Use in each nostril as directed) 30 mL 3   Cholecalciferol (VITAMIN D3 PO) Take 2,000 Units by mouth daily. (Patient taking differently: Take 5,000 Units by mouth daily.)     clindamycin  (CLEOCIN  T) 1 % external solution Apply 1 Application (1 drop/thin film) topically daily. (Patient taking differently: Apply 1 Application topically daily as needed.) 60 mL 3   Collagen-Vitamin C-Biotin (COLLAGEN PO) Take 1 tablet by mouth every other day.     diltiazem  (CARDIZEM  CD) 120 MG 24 hr capsule Take 1 capsule (120 mg total) by mouth daily. 90 capsule 3   ELDERBERRY PO Take by mouth. Every day  2000 mg     escitalopram  (LEXAPRO ) 10 MG tablet Take 1 tablet (10 mg total) by mouth daily. (Patient taking differently: Take 5 mg by mouth daily.) 90 tablet 1   fexofenadine  (FT ALLERGY RELIEF 24 HOUR) 180 MG tablet Take 1 tablet (180 mg total) by mouth daily. (Patient taking differently: Take 180 mg by mouth daily as needed for  allergies.) 90 tablet 1   ibuprofen  (ADVIL ) 800 MG tablet Take 1 tablet (800 mg total) by mouth 2 (two) times daily as needed for pain. 60 tablet 0   Multiple Vitamin (MULTIVITAMIN ADULT PO) Take 1 tablet by mouth every morning. One daily- Every Woman's 55 plus- [place]     TURMERIC PO Take 1,500 mg by mouth every other day.     L-THEANINE PO Take by mouth as needed.     magnesium oxide (MAG-OX) 400 (240 Mg) MG tablet Take 400 mg by mouth every other day.     metroNIDAZOLE  (METROCREAM ) 0.75 % cream Apply 1 Application topically to the face 2 (two) times daily. 45 g 2   triamcinolone  cream (KENALOG ) 0.1 % Apply 1 Application topically 2 (two) times daily to affected area for 14 days. 80 g 0   No current facility-administered medications on file prior to visit.    Allergies  Allergen Reactions   Ceclor [Cefaclor] Rash     DIAGNOSTIC DATA (LABS, IMAGING, TESTING) - I reviewed patient records, labs, notes, testing and imaging myself where available.  Lab Results  Component Value Date   WBC 6.4 04/02/2024   HGB 14.2 04/02/2024   HCT 42.7 04/02/2024   MCV 87.1 04/02/2024   PLT 269 04/02/2024      Component Value Date/Time   NA 139 04/02/2024 1547   K 4.0 04/02/2024 1547   CL 103 04/02/2024 1547   CO2 28 04/02/2024 1547   GLUCOSE 99 04/02/2024 1547   BUN 21 04/02/2024 1547   CREATININE 0.69 04/02/2024 1547   CALCIUM 9.2 04/02/2024  1547   PROT 7.3 04/02/2024 1547   ALBUMIN 4.5 09/25/2023 1606   AST 19 04/02/2024 1547   ALT 21 04/02/2024 1547   ALKPHOS 82 09/25/2023 1606   BILITOT 0.3 04/02/2024 1547   GFRNONAA >60 07/26/2017 0535   GFRAA >60 07/26/2017 0535   Lab Results  Component Value Date   CHOL 217 (H) 04/02/2024   HDL 57 04/02/2024   LDLCALC 125 (H) 04/02/2024   TRIG 210 (H) 04/02/2024   CHOLHDL 3.8 04/02/2024   Lab Results  Component Value Date   HGBA1C 5.9 (A) 02/27/2024   HGBA1C 5.9 02/27/2024   HGBA1C 5.9 02/27/2024   HGBA1C 5.9 02/27/2024   Lab  Results  Component Value Date   VITAMINB12 267 09/25/2023   Lab Results  Component Value Date   TSH 1.43 09/25/2023    PHYSICAL EXAM:  Today's Vitals   06/01/24 1107  BP: 128/84  Pulse: 62  Weight: 218 lb (98.9 kg)  Height: 5' 1 (1.549 m)   Body mass index is 41.19 kg/m.   Wt Readings from Last 3 Encounters:  06/01/24 218 lb (98.9 kg)  04/02/24 209 lb 12.8 oz (95.2 kg)  02/27/24 204 lb (92.5 kg)     Ht Readings from Last 3 Encounters:  06/01/24 5' 1 (1.549 m)  04/02/24 5' 2 (1.575 m)  02/27/24 5' 2 (1.575 m)      General: The patient is awake, alert and appears not in acute distress. The patient is well groomed. Head: Normocephalic, atraumatic. Neck is supple.  Mallampati 1,  neck circumference:16 inches .  Nasal airflow patent.   Retrognathia is seen.  Dental status:  Cardiovascular:  Regular rate and cardiac rhythm by pulse,  without distended neck veins. Respiratory: Lungs are clear to auscultation.  Skin:  With evidence of ankle edema. Trunk: The patient's posture is erect.   NEUROLOGIC EXAM: The patient is awake and alert, oriented to place and time.   Memory subjective described as intact.  Attention span & concentration ability appears normal.  Speech is fluent,  without  dysarthria, dysphonia or aphasia.  Mood and affect are appropriate.   Cranial nerves: no loss of smell or taste reported  Pupils are equal and briskly reactive to light. Funduscopic exam deferred.  Extraocular movements in vertical and horizontal planes were intact and without nystagmus. No Diplopia. Visual fields by finger perimetry are intact. Hearing was intact to soft voice and finger rubbing.    Facial sensation intact to fine touch.  Facial motor strength is symmetric and tongue and uvula move midline.  Neck ROM : rotation, tilt and flexion extension were normal for age and shoulder shrug was symmetrical.    Motor exam:  Symmetric bulk, tone and ROM.   Normal tone  without cog wheeling, symmetrically weaker grip strength -Triggerfinger.    Sensory:  Fine touch and vibration were intact  Proprioception tested in the upper extremities was normal.   Coordination: Rapid alternating movements in the fingers/hands were of normal speed.  The Finger-to-nose maneuver was intact without evidence of ataxia, dysmetria - there is an essential tremor.    Gait and station: Patient could rise unassisted from a seated position, but feels stiffness.  walked without assistive device.  Stance is of normal width/ base and the patient turned with 3 steps.  Toe and heel walk were deferred.  Deep tendon reflexes: in the  upper and lower extremities are symmetric and extremely brisk.  Babinski downgoing.   ASSESSMENT AND PLAN 57  y.o. year old female  here with:    1) increasing fatigue, dull morning headaches, loud snoring. BMI 41, neck 16 and Mallampati is 1- but with a red throat- GERD?    2)  There is hyperreflexia in both legs.   Normal babinski, puffiness in ankles, not numb.  Prediabetes,    3) Dr Soldatova has seen her for allergies.  Left tonsil is enlarged- There is no associated pain, and she has not undergone a tonsillectomy. No trouble swallowing.    Plan:  My first step is to advise an earlier diner time, 3 hours between meal and sleep time.    I will order a HST first but I am worried about GERD and apnea related to acid reflex.  I would order a rheumatological panel for the stiffness, ache and soreness, swelling.   I also would like for her to see her cardiologist,  she may have asymptomatic  a fib that could explain the fatigue.  Fatigue and moring headaches can also be a sign of hypoxia.    A dx of OSA may help to get a medical weight loss program started.    I plan to follow up either personally or through our NP within 3-4 months.   I would like to thank Odette Benjamin, NP and Odette Benjamin, Np [address] [place],   [place] [address] for allowing me to meet with and to take care of this pleasant patient.    After spending a total time of  45  minutes face to face and additional time for physical and neurologic examination, review of laboratory studies,  personal review of imaging studies, reports and results of other testing and review of referral information / records as far as provided in visit,   Electronically signed by: Neomia Banner, MD 06/01/2024 11:43 AM  [facility] Board certified by The [employer] of Sleep Medicine and Diplomate of the [employer] of Sleep Medicine. Board certified In Neurology through the ABPN, Fellow of the [employer] of Neurology.

## 2024-06-10 ENCOUNTER — Other Ambulatory Visit (HOSPITAL_BASED_OUTPATIENT_CLINIC_OR_DEPARTMENT_OTHER): Payer: Self-pay

## 2024-06-10 DIAGNOSIS — M25552 Pain in left hip: Secondary | ICD-10-CM | POA: Diagnosis not present

## 2024-06-10 MED ORDER — PREDNISONE 10 MG (21) PO TBPK
ORAL_TABLET | ORAL | 0 refills | Status: DC
  Filled 2024-06-10: qty 21, 6d supply, fill #0

## 2024-06-16 ENCOUNTER — Encounter (HOSPITAL_BASED_OUTPATIENT_CLINIC_OR_DEPARTMENT_OTHER): Payer: Self-pay | Admitting: Nurse Practitioner

## 2024-06-16 ENCOUNTER — Ambulatory Visit (HOSPITAL_BASED_OUTPATIENT_CLINIC_OR_DEPARTMENT_OTHER): Admitting: Nurse Practitioner

## 2024-06-16 ENCOUNTER — Other Ambulatory Visit (HOSPITAL_BASED_OUTPATIENT_CLINIC_OR_DEPARTMENT_OTHER): Payer: Self-pay

## 2024-06-16 VITALS — BP 124/72 | HR 62 | Ht 62.0 in | Wt 218.0 lb

## 2024-06-16 DIAGNOSIS — E785 Hyperlipidemia, unspecified: Secondary | ICD-10-CM

## 2024-06-16 DIAGNOSIS — R072 Precordial pain: Secondary | ICD-10-CM

## 2024-06-16 DIAGNOSIS — R0683 Snoring: Secondary | ICD-10-CM

## 2024-06-16 DIAGNOSIS — M722 Plantar fascial fibromatosis: Secondary | ICD-10-CM

## 2024-06-16 DIAGNOSIS — Z7189 Other specified counseling: Secondary | ICD-10-CM | POA: Diagnosis not present

## 2024-06-16 DIAGNOSIS — I480 Paroxysmal atrial fibrillation: Secondary | ICD-10-CM | POA: Diagnosis not present

## 2024-06-16 DIAGNOSIS — I471 Supraventricular tachycardia, unspecified: Secondary | ICD-10-CM | POA: Diagnosis not present

## 2024-06-16 MED ORDER — DILTIAZEM HCL 30 MG PO TABS
30.0000 mg | ORAL_TABLET | Freq: Two times a day (BID) | ORAL | 6 refills | Status: AC
  Filled 2024-06-16: qty 60, 30d supply, fill #0
  Filled 2024-12-03 (×2): qty 60, 30d supply, fill #1

## 2024-06-16 MED ORDER — DILTIAZEM HCL ER COATED BEADS 120 MG PO CP24
120.0000 mg | ORAL_CAPSULE | Freq: Every day | ORAL | 3 refills | Status: DC
  Filled 2024-06-16: qty 90, 90d supply, fill #0
  Filled 2024-07-14 – 2024-09-08 (×2): qty 90, 90d supply, fill #1
  Filled 2024-11-25: qty 90, 90d supply, fill #2

## 2024-06-16 NOTE — Progress Notes (Signed)
 Cardiology Office Note   Date:  06/16/2024  ID:  Savannah Cook, DOB 11-Feb-1966, MRN [id number] PCP: Nedra Tinnie LABOR, NP  [facility] Providers Cardiologist:  Shelda Bruckner, MD Cardiology APP:  Dow Arland BROCKS, NP (Inactive)     PMH Atrial fibrillation/flutter GERD Colon cancer Iron deficiency anemia Obesity Former tobacco abuse Quit 1987  Initially seen by Arland Dow, NP in A-fib clinic.  She noted palpitations and was noted to be in typical atrial flutter in PCP office in February 2023.  She was taking several supplements at the time and had been started on Cardizem  30 mg 3 times daily and Eliquis  5 mg twice daily for CHA2DS2-VASc score of 1.  Cardiac monitor shows intermittent atrial flutter.  EKG on 03/13/2022 showed atrial fibrillation at 143 bpm.  She had reduced Eliquis  to once daily.  Sleep study was ordered but she had a very high deductible and insurance did not approve it.  She was advised to stop Eliquis  given CHA2DS2-VASc score of 1 and return to sinus rhythm.  She obtained a Kardia device to help monitor for arrhythmias at home.  She established care with Dr. Bruckner 06/2022.  She was struggling with anxiety, depression, and associated tension headaches.  She felt the symptoms exacerbated A-fib.  Last cardiology clinic visit was 06/17/2023 with Dr. Bruckner.  She asked about starting turmeric for stiffness in her legs.  She also complained of left foot swelling by the end of the day.  Noted whenever she was anxious/stressed at work or rushing, she will start to flutter associated with cough at onset.  Palpitations may last up to 30 minutes.  At PCP office visit 05/17/2023 she was found to be in asymptomatic atrial fibrillation at 119 bpm.  She endorsed snoring and according to her fianc it had worsened recently.  Management strategies were discussed and low-dose diltiazem  was started.  Continue to consider sleep study once affordable.  10-year ASCVD risk  score 6.9%.  She was not being treated for hypertension.  Heart healthy lifestyle modification encouraged and 1 year follow-up.   History of Present Illness Discussed the use of AI scribe software for clinical note transcription with the patient, who gave verbal consent to proceed.  History of Present Illness Savannah Cook is a very pleasant loquacious 58 year old female who presents today for follow-up of atrial fibrillation. She was diagnosed with atrial fibrillation in 2023 and has been on diltiazem  120 mg daily. Recently, she started taking it every other day to evaluate its effects. She has not experienced recent heart flutter episodes or palpitations and questions the necessity of the medication. She experiences heaviness and tiredness in her chest and upper legs at times.  She also has bilateral foot edema.  She admits she has not as active as she would like to be due to limitations from Planter fasciitis. She reports weight gain since menopause in 2022, which she attributes to hormonal changes.  She denies dyspnea on exertion, orthopnea, PND, presyncope, syncope.  She suspects sleep apnea due to loud snoring and unrefreshing sleep but has been unable to obtain a sleep study due to insurance issues. She is motivated to improve her health through diet and exercise, aiming to walk daily and use her gym membership, which includes pools and a hot tub. She consumes two cups of coffee daily and occasionally uses a caffeine supplement called Zipfizz without feeling jittery. She takes vitamin D , elderberry, and a multivitamin for those over 55. She prefers  to minimize medication use.   ROS: See HPI  Studies Reviewed EKG Interpretation Date/Time:  Tuesday June 16 2024 10:58:37 EDT Ventricular Rate:  62 PR Interval:  144 QRS Duration:  76 QT Interval:  400 QTC Calculation: 406 R Axis:   -12  Text Interpretation: Normal sinus rhythm Low voltage QRS When compared with ECG of 19-Apr-2022 15:41, No  significant change was found Confirmed by Percy Browning [phone]) on 06/16/2024 11:22:01 AM     No results found for: LIPOA  Risk Assessment/Calculations  CHA2DS2-VASc Score = 1   This indicates a 0.6% annual risk of stroke. The patient's score is based upon: CHF History: 0 HTN History: 0 Diabetes History: 0 Stroke History: 0 Vascular Disease History: 0 Age Score: 0 Gender Score: 1             Physical Exam VS:  BP 124/72 (BP Location: Right Arm, Patient Position: Sitting, Cuff Size: Normal)   Pulse 62   Ht 5' 2 (1.575 m)   Wt 218 lb (98.9 kg)   LMP 10/06/2022 (Exact Date) Comment: Last cycle was in 07/2021  SpO2 98%   BMI 39.87 kg/m    Wt Readings from Last 3 Encounters:  06/16/24 218 lb (98.9 kg)  06/01/24 218 lb (98.9 kg)  04/02/24 209 lb 12.8 oz (95.2 kg)    GEN: Obese, well developed in no acute distress NECK: No JVD; No carotid bruits CARDIAC: RRR, no murmurs, rubs, gallops RESPIRATORY:  Clear to auscultation without rales, wheezing or rhonchi  ABDOMEN: Soft, non-tender, non-distended EXTREMITIES:  No edema; No deformity   Assessment & Plan Paroxysmal Atrial Fibrillation/SVT Lengthy discussion about continuing diltiazem  for management of A-fib given no recent episodes, no palpitations.  She has been taking diltiazem  120 mg every other day.  She has not noted any change in symptoms.  She has not taken diltiazem  30 mg as needed, but would like to get this refilled to use in the event that palpitations occur.  Advised risk of rebound tachycardia if diltiazem  is stopped abruptly. She plans to continue 120 mg every other day for now. Future anticoagulation therapy may be necessary at age 70 due to increased stroke risk. Consider wearing a monitor to evaluate for a fib burden.  Insurance denied home sleep study.  I have encouraged her to reach out to insurance to see if they will cover in lab sleep test.  Recommendation to work on lifestyle modification to improve diet,  and incorporate regular exercise and weight loss.   Chest pain/Cardiac Risk Assessment She reports occasional chest heaviness.  This does not worsen with activity and is not associated with additional symptoms of diaphoresis, n/v, shortness of breath.  Reports that she generally feels heavy and swollen since menopause. Lengthy discussion about heart healthy lifestyle modification to incorporate more exercise, healthy, plant forward diet, weight loss, and reducing ASCVD risk. I have encouraged her to gradually increase activity and notify us  if symptoms worsen.  She would like to proceed with CT calcium score for further risk stratification.  Hyperlipidemia   Lipid panel completed 04/02/2024 with total cholesterol 217, HDL 57, LDL 125, and triglycerides 210.  We discussed that goal LDL is based upon additional factors.  We will proceed with getting CT calcium score for risk stratification were discussed.  Consider statin therapy if LDL remains elevated and there is evidence of calcification.  Plantar Fasciitis   Recurrent with current foot pain and heaviness. Non-surgical management includes wearing supportive footwear at all times and  performing daily stretching exercises for the plantar fascia.  Obesity She is struggling with weight, particularly since going through menopause. Generally feels weight gain contributes to heaviness and fatigue. Lengthy discussion about the importance of diet and exercise. Encouraged daily walking and regular exercise, and recommendation for whole food, plant-forward diet.  Sleep Apnea (suspected)   Suspected due to fatigue and loud snoring. Insurance denied a home sleep study.I have encouraged her to reach out to insurance company to ask about in-lab study options.         Dispo: 3 months with me  Signed, Rosaline Bane, NP-C

## 2024-06-16 NOTE — Patient Instructions (Signed)
 Medication Instructions:   START Diltiazem  one (1) tablet by mouth ( 120 mg) daily.   START Diltiazem  one (1) tablet by mouth ( 30 mg) twice daily as needed for palpitations.   *If you need a refill on your cardiac medications before your next appointment, please call your pharmacy*  Lab Work:  None ordered.  If you have labs (blood work) drawn today and your tests are completely normal, you will receive your results only by: MyChart Message (if you have MyChart) OR A paper copy in the mail If you have any lab test that is abnormal or we need to change your treatment, we will call you to review the results.  Testing/Procedures:  Rosaline Percy PIETY  has ordered a CT coronary calcium score.   Test locations:  [facility]   This is $99 out of pocket.   Coronary CalciumScan A coronary calcium scan is an imaging test used to look for deposits of calcium and other fatty materials (plaques) in the inner lining of the blood vessels of the heart (coronary arteries). These deposits of calcium and plaques can partly clog and narrow the coronary arteries without producing any symptoms or warning signs. This puts a person at risk for a heart attack. This test can detect these deposits before symptoms develop. Tell a health care provider about: Any allergies you have. All medicines you are taking, including vitamins, herbs, eye drops, creams, and over-the-counter medicines. Any problems you or family members have had with anesthetic medicines. Any blood disorders you have. Any surgeries you have had. Any medical conditions you have. Whether you are pregnant or may be pregnant. What are the risks? Generally, this is a safe procedure. However, problems may occur, including: Harm to a pregnant woman and her unborn baby. This test involves the use of radiation. Radiation exposure can be dangerous to a pregnant woman and her unborn baby. If you are pregnant, you generally should not  have this procedure done. Slight increase in the risk of cancer. This is because of the radiation involved in the test. What happens before the procedure? No preparation is needed for this procedure. What happens during the procedure? You will undress and remove any jewelry around your neck or chest. You will put on a hospital gown. Sticky electrodes will be placed on your chest. The electrodes will be connected to an electrocardiogram (ECG) machine to record a tracing of the electrical activity of your heart. A CT scanner will take pictures of your heart. During this time, you will be asked to lie still and hold your breath for 2-3 seconds while a picture of your heart is being taken. The procedure may vary among health care providers and hospitals. What happens after the procedure? You can get dressed. You can return to your normal activities. It is up to you to get the results of your test. Ask your health care provider, or the department that is doing the test, when your results will be ready. Summary A coronary calcium scan is an imaging test used to look for deposits of calcium and other fatty materials (plaques) in the inner lining of the blood vessels of the heart (coronary arteries). Generally, this is a safe procedure. Tell your health care provider if you are pregnant or may be pregnant. No preparation is needed for this procedure. A CT scanner will take pictures of your heart. You can return to your normal activities after the scan is done. This information is not intended to replace advice  given to you by your health care provider. Make sure you discuss any questions you have with your health care provider. Document Released: 05/31/2008 Document Revised: 10/22/2016 Document Reviewed: 10/22/2016 Elsevier Interactive Patient Education  2017 [employer].   Follow-Up: At [facility], you and your health needs are our priority.  As part of our continuing mission to  provide you with exceptional heart care, our providers are all part of one team.  This team includes your primary Cardiologist (physician) and Advanced Practice Providers or APPs (Physician Assistants and Nurse Practitioners) who all work together to provide you with the care you need, when you need it.  Your next appointment:   3 month(s)  Provider:   Rosaline Bane, NP    We recommend signing up for the patient portal called MyChart.  Sign up information is provided on this After Visit Summary.  MyChart is used to connect with patients for Virtual Visits (Telemedicine).  Patients are able to view lab/test results, encounter notes, upcoming appointments, etc.  Non-urgent messages can be sent to your provider as well.   To learn more about what you can do with MyChart, go to [email or web address].

## 2024-06-24 ENCOUNTER — Ambulatory Visit (INDEPENDENT_AMBULATORY_CARE_PROVIDER_SITE_OTHER): Admitting: Nurse Practitioner

## 2024-06-24 ENCOUNTER — Other Ambulatory Visit (HOSPITAL_BASED_OUTPATIENT_CLINIC_OR_DEPARTMENT_OTHER): Payer: Self-pay

## 2024-06-24 ENCOUNTER — Encounter: Payer: Self-pay | Admitting: Nurse Practitioner

## 2024-06-24 VITALS — BP 128/80 | HR 74 | Ht 61.0 in | Wt 218.2 lb

## 2024-06-24 DIAGNOSIS — Z1331 Encounter for screening for depression: Secondary | ICD-10-CM | POA: Diagnosis not present

## 2024-06-24 DIAGNOSIS — L739 Follicular disorder, unspecified: Secondary | ICD-10-CM | POA: Diagnosis not present

## 2024-06-24 DIAGNOSIS — Z780 Asymptomatic menopausal state: Secondary | ICD-10-CM

## 2024-06-24 DIAGNOSIS — Z01419 Encounter for gynecological examination (general) (routine) without abnormal findings: Secondary | ICD-10-CM

## 2024-06-24 MED ORDER — CLINDAMYCIN PHOSPHATE 1 % EX SOLN
Freq: Two times a day (BID) | CUTANEOUS | 0 refills | Status: AC
  Filled 2024-06-24: qty 60, 30d supply, fill #0

## 2024-06-24 NOTE — Progress Notes (Signed)
 Savannah Cook [birth year] [id number]   History:  58 y.o. G1P1 presents for annual exam. Postmenopausal - no HRT. Normal pap history. 2018 colon cancer. On Eliquis  for A. Flutter/fib, managed by cardiology. Anxiety/depression, prediabetes, HLD managed by PCP. Experiences intermittent folliculitis on inner thighs. Has used clindamycin  topical solution in the past with good management. Would like refill.   Gynecologic History Patient's last menstrual period was 10/06/2022 (exact date).   Contraception/Family planning: post menopausal status Sexually active: Yes  Health Maintenance Last Pap: 04/03/2022. Results were: Normal neg HPV Last mammogram: 10/11/2023. Results were: Normal Last colonoscopy: 10/05/2021. Results were: Benign polyps, 5-year recall Last Dexa: Not indicated  Past medical history, past surgical history, family history and social history were all reviewed and documented in the EPIC chart. Works for apartment complex, in process of being bought out. Boyfriend, moved here last year from [place]. 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   ROS:  A ROS was performed and pertinent positives and negatives are included.  Exam:  Vitals:   06/24/24 0943  BP: 128/80  Pulse: 74  SpO2: 97%  Weight: 218 lb 3.2 oz (99 kg)  Height: 5' 1 (1.549 m)      Body mass index is 41.23 kg/m.  General appearance:  Normal Thyroid :  Symmetrical, normal in size, without palpable masses or nodularity. Respiratory  Auscultation:  Clear without wheezing or rhonchi Cardiovascular  Auscultation:  Regular rate, without rubs, murmurs or gallops  Edema/varicosities:  Not grossly evident Abdominal  Soft,nontender, without masses, guarding or rebound.  Liver/spleen:  No organomegaly noted  Hernia:  None appreciated  Skin  Inspection:  Raised areas, some in different stages of healing on inner thighs, some with white heads on top c/w  folliculitis Breasts: Examined lying and sitting.   Right: Without masses, retractions, discharge or axillary adenopathy.   Left: Without masses, retractions, discharge or axillary adenopathy. Pelvic: External genitalia:  no lesions              Urethra:  normal appearing urethra with no masses, tenderness or lesions              Bartholins and Skenes: normal                 Vagina: normal appearing vagina with normal color and discharge, no lesions              Cervix: no lesions Bimanual Exam:  Uterus:  no masses or tenderness              Adnexa: no mass, fullness, tenderness              Rectovaginal: Deferred              Anus:  normal, no lesions  Patient informed chaperone available to be present for breast and pelvic exam. Patient has requested no chaperone to be present. Patient has been advised what will be completed during breast and pelvic exam.   Assessment/Plan:  58 y.o. G1P1 for annual exam.   Well female exam with routine gynecological exam - Education provided on SBEs, importance of preventative screenings, current guidelines, high calcium diet, regular exercise, and multivitamin daily. Labs with PCP in April.   Postmenopausal - no HRT  Folliculitis - Plan: clindamycin  (CLEOCIN  T) 1 % external solution BID as needed.   Screening for cervical cancer - Normal Pap history. Will repeat at 5-year interval per guidelines.  Screening for breast cancer - Normal mammogram history.  Continue annual screenings.  Normal breast exam today.  Screening for colon cancer - 2022 colonoscopy. History of colon cancer in 2018. Will repeat at 5-year interval per GI's recommendation.  Screening for osteoporosis - Average risk. Will plan for DXA at age 34. Continue Vitamin D  supplement, high calcium diet, and increase exercise.   Return in about 1 year (around 06/24/2025) for Annual.    Savannah DELENA Shutter DNP, 10:39 AM 06/24/2024

## 2024-07-03 ENCOUNTER — Other Ambulatory Visit (HOSPITAL_BASED_OUTPATIENT_CLINIC_OR_DEPARTMENT_OTHER): Payer: Self-pay

## 2024-07-07 ENCOUNTER — Encounter: Payer: Self-pay | Admitting: Nurse Practitioner

## 2024-07-07 ENCOUNTER — Telehealth: Payer: Self-pay

## 2024-07-07 ENCOUNTER — Ambulatory Visit: Admitting: Nurse Practitioner

## 2024-07-07 VITALS — BP 124/84 | HR 82

## 2024-07-07 DIAGNOSIS — E785 Hyperlipidemia, unspecified: Secondary | ICD-10-CM | POA: Diagnosis not present

## 2024-07-07 DIAGNOSIS — N950 Postmenopausal bleeding: Secondary | ICD-10-CM | POA: Diagnosis not present

## 2024-07-07 DIAGNOSIS — R7303 Prediabetes: Secondary | ICD-10-CM | POA: Diagnosis not present

## 2024-07-07 NOTE — Telephone Encounter (Signed)
 Copied from CRM [id number]. Topic: Clinical - Medical Advice >> Jul 07, 2024 12:09 PM Laymon HERO wrote: Reason for CRM: Patient wanted to update Lauren about some health issues, asking for [place] or Lauren to reach out

## 2024-07-07 NOTE — Telephone Encounter (Signed)
 I returned patient's call and she is concerned that she started a full blown menstrual cycle . She scheduled an appointment with her Gyn had her A1C and cholesterol checked and has a ultrasound scheduled next week. Her next visit here is in October.  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

## 2024-07-07 NOTE — Progress Notes (Signed)
   Acute Office Visit  Subjective:    Patient ID: Savannah Cook, female    DOB: 07-30-1966, 57 y.o.   MRN: [id number]   HPI 58 y.o. presents today for bleeding. On 07/02/24 had some very light bleeding that was light red and brown. The next day she began having moderate, period-like bleeding that last a few days, bleeding now is very light. LMP 09/2022. Has been very stressed with work changes. Would like lipid panel and A1c checked today. Last done in April by PCP.   Patient's last menstrual period was 10/06/2022 (exact date).    Review of Systems  Constitutional: Negative.   Genitourinary:  Positive for vaginal bleeding.       Objective:    Physical Exam Constitutional:      Appearance: Normal appearance.     BP 124/84 (BP Location: Left Arm, Patient Position: Sitting)   Pulse 82   LMP 10/06/2022 (Exact Date) Comment: Last cycle was in 07/2021  SpO2 96%  Wt Readings from Last 3 Encounters:  06/24/24 218 lb 3.2 oz (99 kg)  06/16/24 218 lb (98.9 kg)  06/01/24 218 lb (98.9 kg)        Assessment & Plan:   Problem List Items Addressed This Visit       Other   Prediabetes   Relevant Orders   Hemoglobin A1c   Other Visit Diagnoses       Postmenopausal bleeding    -  Primary   Relevant Orders   US  PELVIS TRANSVAGINAL NON-OB (TV ONLY)     Hyperlipidemia, unspecified hyperlipidemia type       Relevant Orders   Lipid panel      Plan: Schedule ultrasound. Labs today per request. Will follow up with PCP.   Return for Ultrasound.    Annabella DELENA Shutter DNP, 11:22 AM 07/07/2024

## 2024-07-08 ENCOUNTER — Ambulatory Visit: Payer: Self-pay | Admitting: Nurse Practitioner

## 2024-07-08 LAB — HEMOGLOBIN A1C
Hgb A1c MFr Bld: 6.1 % — ABNORMAL HIGH (ref <5.7)
Mean Plasma Glucose: 128 mg/dL
eAG (mmol/L): 7.1 mmol/L

## 2024-07-08 LAB — LIPID PANEL
Cholesterol: 214 mg/dL — ABNORMAL HIGH (ref <200)
HDL: 78 mg/dL (ref >=50)
LDL Cholesterol (Calc): 114 mg/dL — ABNORMAL HIGH
Non-HDL Cholesterol (Calc): 136 mg/dL — ABNORMAL HIGH (ref <130)
Total CHOL/HDL Ratio: 2.7 (calc) (ref <5.0)
Triglycerides: 109 mg/dL (ref <150)

## 2024-07-14 ENCOUNTER — Ambulatory Visit

## 2024-07-14 ENCOUNTER — Ambulatory Visit (HOSPITAL_BASED_OUTPATIENT_CLINIC_OR_DEPARTMENT_OTHER)
Admission: RE | Admit: 2024-07-14 | Discharge: 2024-07-14 | Disposition: A | Discharge status: Home / Self Care | Payer: Self-pay | Source: Ambulatory Visit | Attending: Nurse Practitioner | Admitting: Nurse Practitioner

## 2024-07-14 ENCOUNTER — Ambulatory Visit: Payer: Self-pay | Admitting: Nurse Practitioner

## 2024-07-14 ENCOUNTER — Other Ambulatory Visit (HOSPITAL_BASED_OUTPATIENT_CLINIC_OR_DEPARTMENT_OTHER): Payer: Self-pay

## 2024-07-14 DIAGNOSIS — N950 Postmenopausal bleeding: Secondary | ICD-10-CM | POA: Diagnosis not present

## 2024-07-14 DIAGNOSIS — I480 Paroxysmal atrial fibrillation: Secondary | ICD-10-CM | POA: Insufficient documentation

## 2024-07-14 DIAGNOSIS — I471 Supraventricular tachycardia, unspecified: Secondary | ICD-10-CM | POA: Insufficient documentation

## 2024-07-14 DIAGNOSIS — Z7189 Other specified counseling: Secondary | ICD-10-CM | POA: Insufficient documentation

## 2024-07-22 ENCOUNTER — Ambulatory Visit (INDEPENDENT_AMBULATORY_CARE_PROVIDER_SITE_OTHER): Payer: Self-pay | Admitting: Nurse Practitioner

## 2024-07-22 ENCOUNTER — Other Ambulatory Visit (HOSPITAL_COMMUNITY)
Admission: RE | Admit: 2024-07-22 | Discharge: 2024-07-22 | Disposition: A | Discharge status: Home / Self Care | Source: Ambulatory Visit | Attending: Nurse Practitioner | Admitting: Nurse Practitioner

## 2024-07-22 ENCOUNTER — Encounter: Payer: Self-pay | Admitting: Nurse Practitioner

## 2024-07-22 ENCOUNTER — Other Ambulatory Visit: Payer: Self-pay | Admitting: Nurse Practitioner

## 2024-07-22 VITALS — BP 112/80 | HR 86

## 2024-07-22 DIAGNOSIS — N950 Postmenopausal bleeding: Secondary | ICD-10-CM

## 2024-07-22 NOTE — Progress Notes (Signed)
   Acute Office Visit  Subjective:    Patient ID: Savannah Cook, female    DOB: February 02, 1966, 58 y.o.   MRN: [id number]   HPI 58 y.o. presents today to discuss US  results and EMB. Seen 07/07/24 with complaints of episode of bleeding. 07/02/24 had very light bleeding that was light red and brown. The next day she began having moderate, period-like bleeding that lasted a few days. LMP in 2022. Has been very stressed with work changes.   Patient's last menstrual period was 10/06/2022 (exact date).    Review of Systems  Constitutional: Negative.   Genitourinary: Negative.        Objective:    Physical Exam Constitutional:      Appearance: Normal appearance.  Genitourinary:    General: Normal vulva.     Vagina: Normal.     Cervix: Normal.     BP 112/80   Pulse 86   LMP 10/06/2022 (Exact Date) Comment: Last cycle was in 07/2021  SpO2 98%  Wt Readings from Last 3 Encounters:  06/24/24 218 lb 3.2 oz (99 kg)  06/16/24 218 lb (98.9 kg)  06/01/24 218 lb (98.9 kg)        Vaginal ultrasound (comparison is made to 2024 ultrasound): Anteverted uterus, normal size and shape, no myometrial masses.  Inhomogeneous uterine pattern with some cystic areas seen (adenomyosis?).  Symmetrical endometrium - 3.93 mm.  No masses or thickening seen, avascular.  Both ovaries within normal limits.  No adnexal masses, no free fluid.  ENDOMETRIAL BIOPSY      Reason for biopsy: PMB, borderline thickened endometrium, cystic areas  The indications for endometrial biopsy were reviewed.    Risks of the biopsy including cramping, bleeding, infection, uterine perforation, inadequate specimen and need for additional procedures  were discussed.  The patient states she understands and agrees to undergo procedure today. Consent obtained.  Time out was performed.   Procedure Speculum inserted into the vagina, cervix visualized and was prepped with Betadine. A single-toothed tenaculum was placed on the anterior  lip of the cervix to stabilize it.  The 3 mm pipelle was introduced into the endometrial cavity without difficulty to a depth of 7cm, suction initiated and a moderate amount of tissue was obtained and sent to pathology.  The instruments were removed from the patient's vagina.  Minimal bleeding from the cervix was noted.  The patient tolerated the procedure well.   Zada Louder, CMA present for exam  Assessment/Plan:  Ultrasound reviewed with patient.   PMB (postmenopausal bleeding) - Plan: Endometrial biopsy, Surgical pathology   Routine post-procedure instructions were given to the patient.   Will contact with results of biopsy.    Annabella DELENA Shutter DNP, 3:39 PM 07/22/2024

## 2024-07-24 ENCOUNTER — Ambulatory Visit: Payer: Self-pay | Admitting: Nurse Practitioner

## 2024-07-24 LAB — SURGICAL PATHOLOGY

## 2024-08-03 ENCOUNTER — Ambulatory Visit: Admitting: Nurse Practitioner

## 2024-08-04 ENCOUNTER — Other Ambulatory Visit (HOSPITAL_BASED_OUTPATIENT_CLINIC_OR_DEPARTMENT_OTHER): Payer: Self-pay

## 2024-08-10 ENCOUNTER — Other Ambulatory Visit (HOSPITAL_BASED_OUTPATIENT_CLINIC_OR_DEPARTMENT_OTHER): Payer: Self-pay

## 2024-08-10 ENCOUNTER — Ambulatory Visit: Admitting: Nurse Practitioner

## 2024-08-10 ENCOUNTER — Encounter: Payer: Self-pay | Admitting: Nurse Practitioner

## 2024-08-10 VITALS — BP 116/80 | HR 71 | Temp 98.1°F | Ht 62.0 in | Wt 220.6 lb

## 2024-08-10 DIAGNOSIS — R9389 Abnormal findings on diagnostic imaging of other specified body structures: Secondary | ICD-10-CM | POA: Insufficient documentation

## 2024-08-10 DIAGNOSIS — N939 Abnormal uterine and vaginal bleeding, unspecified: Secondary | ICD-10-CM | POA: Insufficient documentation

## 2024-08-10 DIAGNOSIS — E782 Mixed hyperlipidemia: Secondary | ICD-10-CM | POA: Diagnosis not present

## 2024-08-10 DIAGNOSIS — R7303 Prediabetes: Secondary | ICD-10-CM

## 2024-08-10 MED ORDER — ZEPBOUND 2.5 MG/0.5ML ~~LOC~~ SOAJ
2.5000 mg | SUBCUTANEOUS | 1 refills | Status: DC
  Filled 2024-08-10: qty 2, 28d supply, fill #0

## 2024-08-10 NOTE — Assessment & Plan Note (Signed)
 Postmenopausal bleeding in July 2025 with normal biopsy results. No current bleeding reported. Monitor for any further bleeding. Continue collaboration and recommendations from GYN.

## 2024-08-10 NOTE — Assessment & Plan Note (Signed)
 Prediabetes with A1c of 6.1. Discussed weight management and lifestyle changes as primary interventions.

## 2024-08-10 NOTE — Assessment & Plan Note (Signed)
 BMI 40.3. Discussed GLP-1 receptor agonists for weight management, including possible side effects. She is motivated for lifestyle changes. Discussed tracking foods and limiting portion sizes along with exercise. Start zepbound  2.5mg  injection weekly. Follow up in six weeks to assess progress.

## 2024-08-10 NOTE — Assessment & Plan Note (Signed)
 CT scan showed coronary artery calcification with a calcium score of 37.6. Discussed rosuvastatin and lifestyle changes. She declines rosuvastatin today. Continue collaboration and recommendations from cardiology.

## 2024-08-10 NOTE — Patient Instructions (Signed)
 It was great to see you!  Start zepbound  injection once a week. You can give this in your stomach, leg, arm. Make sure you are drinking plenty of water   Continue to focus on limiting sugars and eating smaller portions.   Let's follow-up in 6 weeks, sooner if you have concerns.  If a referral was placed today, you will be contacted for an appointment. Please note that routine referrals can sometimes take up to 3-4 weeks to process. Please call our office if you haven't heard anything after this time frame.  Take care,  Tinnie Harada, NP

## 2024-08-10 NOTE — Assessment & Plan Note (Signed)
 CT scan showed irregular ground glass nodules, concerning for fibrosis and interstitial lung disease. Family history of lung cancer noted. Refer to pulmonology for further evaluation.

## 2024-08-10 NOTE — Progress Notes (Signed)
 Established Patient Office Visit  Subjective   Patient ID: Savannah Cook, female    DOB: Mar 03, 1966  Age: 57 y.o. MRN: [id number]  Chief Complaint  Patient presents with   Medical Management of Chronic Issues    Discuss test result, weight loss medication for injection will insurance cover and asking about  (GHT-1)ct scan in July showed that she need low dose for  cholesterol levels. Also A1c was 6.1 .    HPI Discussed the use of AI scribe software for clinical note transcription with the patient, who gave verbal consent to proceed.  History of Present Illness   Savannah Cook is a 58 year old female who presents for a consultation regarding her recent CT scan results and medication management.  Her recent CT scan showed a calcium score of 37.6, and she was advised to start rosuvastatin 5 mg. She is hesitant to begin new medications due to life transitions. The CT scan also revealed irregular ground glass nodules in her lungs. She denies chest pain and shortness of breath.   In July 2025, she experienced unexpected vaginal bleeding despite being postmenopausal since 2022. An ultrasound indicated a slightly thickened endometrium, and a biopsy was normal. She is following with GYN for this.   She is interested in weight loss options, particularly GLP-1 medications, due to stress eating and a desire to prevent diabetes, as her A1c was 6.1. She has tried various diets and eating healthy in the past, but then will gain the weight back.       ROS See pertinent positives and negatives per HPI.    Objective:     BP 116/80 (BP Location: Left Arm, Patient Position: Sitting, Cuff Size: Normal)   Pulse 71   Temp 98.1 F (36.7 C) (Temporal)   Ht 5' 2 (1.575 m)   Wt 220 lb 9.6 oz (100.1 kg)   LMP 10/06/2022 (Exact Date) Comment: Last cycle was in 07/2021  SpO2 97%   BMI 40.35 kg/m  BP Readings from Last 3 Encounters:  08/10/24 116/80  07/22/24 112/80  07/07/24 124/84   Wt  Readings from Last 3 Encounters:  08/10/24 220 lb 9.6 oz (100.1 kg)  06/24/24 218 lb 3.2 oz (99 kg)  06/16/24 218 lb (98.9 kg)      Physical Exam Vitals and nursing note reviewed.  Constitutional:      General: She is not in acute distress.    Appearance: Normal appearance.  HENT:     Head: Normocephalic.  Eyes:     Conjunctiva/sclera: Conjunctivae normal.  Cardiovascular:     Rate and Rhythm: Normal rate and regular rhythm.     Pulses: Normal pulses.     Heart sounds: Normal heart sounds.  Pulmonary:     Effort: Pulmonary effort is normal.     Breath sounds: Normal breath sounds.  Musculoskeletal:     Cervical back: Normal range of motion.  Skin:    General: Skin is warm.  Neurological:     General: No focal deficit present.     Mental Status: She is alert and oriented to person, place, and time.  Psychiatric:        Mood and Affect: Mood normal.        Behavior: Behavior normal.        Thought Content: Thought content normal.        Judgment: Judgment normal.    The 10-year ASCVD risk score (Arnett DK, et al., 2019) is: 2%  Assessment & Plan:   Problem List Items Addressed This Visit       Genitourinary   Abnormal uterine bleeding   Postmenopausal bleeding in July 2025 with normal biopsy results. No current bleeding reported. Monitor for any further bleeding. Continue collaboration and recommendations from GYN.         Other   Morbid obesity (HCC)   BMI 40.3. Discussed GLP-1 receptor agonists for weight management, including possible side effects. She is motivated for lifestyle changes. Discussed tracking foods and limiting portion sizes along with exercise. Start zepbound  2.5mg  injection weekly. Follow up in six weeks to assess progress.      Relevant Medications   tirzepatide  (ZEPBOUND ) 2.5 MG/0.5ML Pen   Mixed hyperlipidemia - Primary   CT scan showed coronary artery calcification with a calcium score of 37.6. Discussed rosuvastatin and lifestyle  changes. She declines rosuvastatin today. Continue collaboration and recommendations from cardiology.       Prediabetes   Prediabetes with A1c of 6.1. Discussed weight management and lifestyle changes as primary interventions.      Abnormal CT scan   CT scan showed irregular ground glass nodules, concerning for fibrosis and interstitial lung disease. Family history of lung cancer noted. Refer to pulmonology for further evaluation.      Relevant Orders   Ambulatory referral to Pulmonology   Return in about 6 weeks (around 09/21/2024) for weight management .    Tinnie DELENA Harada, NP

## 2024-08-11 ENCOUNTER — Other Ambulatory Visit (HOSPITAL_BASED_OUTPATIENT_CLINIC_OR_DEPARTMENT_OTHER): Payer: Self-pay

## 2024-08-12 ENCOUNTER — Other Ambulatory Visit (HOSPITAL_BASED_OUTPATIENT_CLINIC_OR_DEPARTMENT_OTHER): Payer: Self-pay

## 2024-08-12 ENCOUNTER — Ambulatory Visit (INDEPENDENT_AMBULATORY_CARE_PROVIDER_SITE_OTHER): Admitting: Neurology

## 2024-08-12 ENCOUNTER — Telehealth: Payer: Self-pay

## 2024-08-12 ENCOUNTER — Other Ambulatory Visit (HOSPITAL_COMMUNITY): Payer: Self-pay

## 2024-08-12 DIAGNOSIS — R0681 Apnea, not elsewhere classified: Secondary | ICD-10-CM

## 2024-08-12 DIAGNOSIS — R0683 Snoring: Secondary | ICD-10-CM

## 2024-08-12 DIAGNOSIS — G4733 Obstructive sleep apnea (adult) (pediatric): Secondary | ICD-10-CM | POA: Diagnosis not present

## 2024-08-12 DIAGNOSIS — R519 Headache, unspecified: Secondary | ICD-10-CM

## 2024-08-12 DIAGNOSIS — M79609 Pain in unspecified limb: Secondary | ICD-10-CM

## 2024-08-12 DIAGNOSIS — R292 Abnormal reflex: Secondary | ICD-10-CM

## 2024-08-12 DIAGNOSIS — I480 Paroxysmal atrial fibrillation: Secondary | ICD-10-CM

## 2024-08-12 NOTE — Telephone Encounter (Signed)
 Pharmacy Patient Advocate Encounter   Received notification from [employer] that prior authorization for Zepbound  2.5MG /0.5ML pen-injectors is required/requested.   Insurance verification completed.   The patient is insured through [employer] .   Per test claim: PA required; PA submitted to above mentioned insurance via [employer] Key/confirmation #/EOC [employer] Status is pending

## 2024-08-13 ENCOUNTER — Other Ambulatory Visit (HOSPITAL_BASED_OUTPATIENT_CLINIC_OR_DEPARTMENT_OTHER): Payer: Self-pay

## 2024-08-13 ENCOUNTER — Ambulatory Visit: Admitting: Nurse Practitioner

## 2024-08-13 NOTE — Telephone Encounter (Signed)
 Pharmacy Patient Advocate Encounter  Received notification from [employer] that Prior Authorization for Zepbound  2.5MG /0.5ML pen-injectors  has been PLAN EXCLUSION FOR WEIGHT LOSS MEDS   PLEASE BE ADVISED PLAN DOES NOT COVER WEGOVY EITHER WEIGHT LOSS MEDS ARE PLAN EXCLUSION   Drug is not covered by plan

## 2024-08-14 ENCOUNTER — Other Ambulatory Visit (HOSPITAL_BASED_OUTPATIENT_CLINIC_OR_DEPARTMENT_OTHER): Payer: Self-pay

## 2024-08-18 ENCOUNTER — Encounter

## 2024-08-18 ENCOUNTER — Other Ambulatory Visit (HOSPITAL_BASED_OUTPATIENT_CLINIC_OR_DEPARTMENT_OTHER): Payer: Self-pay

## 2024-08-19 ENCOUNTER — Other Ambulatory Visit (HOSPITAL_BASED_OUTPATIENT_CLINIC_OR_DEPARTMENT_OTHER): Payer: Self-pay

## 2024-08-20 ENCOUNTER — Other Ambulatory Visit (HOSPITAL_BASED_OUTPATIENT_CLINIC_OR_DEPARTMENT_OTHER): Payer: Self-pay

## 2024-08-21 ENCOUNTER — Other Ambulatory Visit (HOSPITAL_BASED_OUTPATIENT_CLINIC_OR_DEPARTMENT_OTHER): Payer: Self-pay

## 2024-09-01 ENCOUNTER — Telehealth: Payer: Self-pay

## 2024-09-01 NOTE — Telephone Encounter (Signed)
 Copied from CRM [id number]. Topic: General - Other >> Sep 01, 2024 11:31 AM [place] J wrote: Reason for CRM: Patient would like a call back from Lauren's CMA in regards to her upcoming physical on 10/01. She would not go into much detail and informed me that she would know what she is talking about.

## 2024-09-02 ENCOUNTER — Ambulatory Visit (INDEPENDENT_AMBULATORY_CARE_PROVIDER_SITE_OTHER): Admitting: Pulmonary Disease

## 2024-09-02 ENCOUNTER — Encounter: Payer: Self-pay | Admitting: Pulmonary Disease

## 2024-09-02 VITALS — BP 131/85 | Temp 98.9°F | Ht 62.0 in | Wt 224.0 lb

## 2024-09-02 DIAGNOSIS — R9389 Abnormal findings on diagnostic imaging of other specified body structures: Secondary | ICD-10-CM

## 2024-09-02 DIAGNOSIS — J849 Interstitial pulmonary disease, unspecified: Secondary | ICD-10-CM

## 2024-09-02 NOTE — Patient Instructions (Addendum)
,   VISIT SUMMARY: You visited us  today due to abnormal findings on your cardiac CT scan and chest tightness, particularly at night. We discussed your symptoms and planned further evaluations to understand your condition better.  YOUR PLAN: POSSIBLE EARLY INTERSTITIAL LUNG DISEASE: Your cardiac CT scan showed minimal changes that might suggest early interstitial lung disease. This could be due to inflammation or fibrosis in your lungs. -We will order a high-resolution CT scan of your chest to get a more detailed look at your lungs. -We will schedule pulmonary function tests to evaluate how well your lungs are working.

## 2024-09-02 NOTE — Telephone Encounter (Signed)
 I spoke with pt, she has an upcoming appt scheduled for this coming Monday 09/07/24. She say she doesn't need a cb any longer.

## 2024-09-02 NOTE — Telephone Encounter (Signed)
 Noted

## 2024-09-02 NOTE — Progress Notes (Unsigned)
         Savannah Cook    [id number]    [birth year]  Primary Care Physician:McElwee, Tinnie LABOR, NP  Referring Physician: Nedra Tinnie LABOR, NP [address] [place],  [place] [address]  Chief complaint:  ***  HPI: 58 y.o. who  has a past medical history of Anxiety, Atrial fibrillation (HCC), Atrial flutter (HCC), Colon cancer (HCC) (06/10/2017), GERD (gastroesophageal reflux disease), Hx of colonic polyp - sessile serrated (10/08/2018), Iron deficiency anemia due to chronic blood loss, Seasonal allergies, and Shingles (11/2021).  Discussed the use of AI scribe software for clinical note transcription with the patient, who gave verbal consent to proceed.  History of Present Illness      Relevant Pulmonary history: Pets: Occupation: Exposures: No h/o chemo/XRT/amiodarone/macrodantin /MTX  No exposure to asbestos, silica or other organic allergens  Smoking history: Travel history: Family history:   Outpatient Encounter Medications as of 09/02/2024  Medication Sig   acetaminophen  (TYLENOL ) 500 MG tablet Take 500 mg by mouth every 6 (six) hours as needed for moderate pain, fever, headache or mild pain.   Cholecalciferol (VITAMIN D3 PO) Take 2,000 Units by mouth daily.   clindamycin  (CLEOCIN  T) 1 % external solution Apply topically 2 (two) times daily. (Patient taking differently: Apply topically 2 (two) times daily. PRN)   Collagen-Vitamin C-Biotin (COLLAGEN PO) Take 1 tablet by mouth every other day.   diltiazem  (CARDIZEM  CD) 120 MG 24 hr capsule Take 1 capsule (120 mg total) by mouth daily. (Patient taking differently: Take 120 mg by mouth daily. Every other day.)   diltiazem  (CARDIZEM ) 30 MG tablet Take 1 tablet (30 mg total) by mouth 2 (two) times daily for palpitations. (Patient taking differently: Take 30 mg by mouth 2 (two) times daily. PRN)   ELDERBERRY PO Take by mouth. Every day  2000 mg   escitalopram  (LEXAPRO ) 10 MG tablet Take 1 tablet (10 mg total) by mouth  daily. (Patient taking differently: Take 10 mg by mouth daily. PRN)   fexofenadine  (FT ALLERGY RELIEF 24 HOUR) 180 MG tablet Take 1 tablet (180 mg total) by mouth daily. (Patient taking differently: Take 180 mg by mouth daily. PRN)   ibuprofen  (ADVIL ) 800 MG tablet Take 1 tablet (800 mg total) by mouth 2 (two) times daily as needed for pain.   Multiple Vitamin (MULTIVITAMIN ADULT PO) Take 1 tablet by mouth every morning. One daily- Every Woman's 55 plus- [place]   tirzepatide  (ZEPBOUND ) 2.5 MG/0.5ML Pen Inject 2.5 mg into the skin once a week. (Patient not taking: Reported on 09/02/2024)   No facility-administered encounter medications on file as of 09/02/2024.     Physical Exam: Today's Vitals   09/02/24 1428  BP: 131/85  Temp: 98.9 F (37.2 C)  TempSrc: Oral  SpO2: 98%  Weight: 224 lb (101.6 kg)  Height: 5' 2 (1.575 m)   Body mass index is 40.97 kg/m.  Physical Exam     Data Reviewed: Imaging: Cardiac CT 07/14/2024-  PFTs:  Labs:  Assessment and Plan Assessment & Plan       Recommendations: *** This appointment required *** minutes of patient care (this includes precharting, chart review, review of results, face-to-face care, etc.).  Lonna Coder MD [facility] 09/02/2024, 2:51 PM  CC: Nedra Tinnie LABOR, NP

## 2024-09-02 NOTE — Progress Notes (Unsigned)
 Savannah Cook    [id number]    1966/01/13  Primary Care Physician:McElwee, Tinnie LABOR, NP  Referring Physician: Nedra Tinnie LABOR, NP [address] [place],  [place] [address]  Chief complaint: Consult for dyspnea, abnormal CT  HPI: 58 y.o. who  has a past medical history of Anxiety, Atrial fibrillation (HCC), Atrial flutter (HCC), Colon cancer (HCC) (06/10/2017), GERD (gastroesophageal reflux disease), Hx of colonic polyp - sessile serrated (10/08/2018), Iron deficiency anemia due to chronic blood loss, Seasonal allergies, and Shingles (11/2021).  Discussed the use of AI scribe software for clinical note transcription with the patient, who gave verbal consent to proceed.  History of Present Illness Savannah Cook is a 58 year old female who presents with abnormal findings on a cardiac CT scan and chest tightness. She was referred by her primary care physician, Dr. Tinnie Prosper, for evaluation of abnormal findings on a cardiac CT scan.  Abnormal cardiac imaging - Abnormal findings on cardiac CT scan identified by primary care physician - Referred for further evaluation of these findings  Chest tightness - Chest tightness, particularly at night - Symptoms attributed to occupational stress  Tobacco exposure - History of smoking during high school  Allergic rhinitis - Seasonal allergies, especially to ragweed  Relevant Pulmonary history: Pets: Occupation: Exposures: No h/o chemo/XRT/amiodarone/macrodantin /MTX  No exposure to asbestos, silica or other organic allergens  Smoking history: Travel history: Family history:   Outpatient Encounter Medications as of 09/02/2024  Medication Sig   acetaminophen  (TYLENOL ) 500 MG tablet Take 500 mg by mouth every 6 (six) hours as needed for moderate pain, fever, headache or mild pain.   Cholecalciferol (VITAMIN D3 PO) Take 2,000 Units by mouth daily.   clindamycin  (CLEOCIN  T) 1 % external solution Apply topically 2  (two) times daily. (Patient taking differently: Apply topically 2 (two) times daily. PRN)   Collagen-Vitamin C-Biotin (COLLAGEN PO) Take 1 tablet by mouth every other day.   diltiazem  (CARDIZEM  CD) 120 MG 24 hr capsule Take 1 capsule (120 mg total) by mouth daily. (Patient taking differently: Take 120 mg by mouth daily. Every other day.)   diltiazem  (CARDIZEM ) 30 MG tablet Take 1 tablet (30 mg total) by mouth 2 (two) times daily for palpitations. (Patient taking differently: Take 30 mg by mouth 2 (two) times daily. PRN)   ELDERBERRY PO Take by mouth. Every day  2000 mg   escitalopram  (LEXAPRO ) 10 MG tablet Take 1 tablet (10 mg total) by mouth daily. (Patient taking differently: Take 10 mg by mouth daily. PRN)   fexofenadine  (FT ALLERGY RELIEF 24 HOUR) 180 MG tablet Take 1 tablet (180 mg total) by mouth daily. (Patient taking differently: Take 180 mg by mouth daily. PRN)   ibuprofen  (ADVIL ) 800 MG tablet Take 1 tablet (800 mg total) by mouth 2 (two) times daily as needed for pain.   Multiple Vitamin (MULTIVITAMIN ADULT PO) Take 1 tablet by mouth every morning. One daily- Every Woman's 55 plus- [place]   tirzepatide  (ZEPBOUND ) 2.5 MG/0.5ML Pen Inject 2.5 mg into the skin once a week. (Patient not taking: Reported on 09/02/2024)   No facility-administered encounter medications on file as of 09/02/2024.     Physical Exam: Today's Vitals   09/02/24 1428  BP: 131/85  Temp: 98.9 F (37.2 C)  TempSrc: Oral  SpO2: 98%  Weight: 224 lb (101.6 kg)  Height: 5' 2 (1.575 m)   Body mass index is 40.97 kg/m.  Physical Exam  GEN: No acute distress. CV: Regular rate and rhythm, no murmurs. LUNGS: Clear to auscultation bilaterally, normal respiratory effort. SKIN JOINTS: Warm and dry, no rash.    Data Reviewed: Imaging: CT chest 06/14/2017-2 mm nodule in the left upper lobe CT coronary 06/24/24-visualized lungs show minimal ground glass opacities in the right middle lobe and lingula I have  reviewed the images personally.  PFTs:  Labs:  Assessment and Plan Assessment & Plan Possible early interstitial lung disease Minimal changes on cardiac CT scan suggest possible early interstitial lung disease. Changes are mild and bilateral. Differential diagnosis includes pulmonary inflammation or fibrosis, potentially related to stress or viral infection. Further evaluation is required to confirm interstitial lung disease. - Order high-resolution CT scan of the chest for detailed lung assessment. - Schedule pulmonary function tests to evaluate lung function.  Recommendations:   Kathlene Yano MD [facility] 09/02/2024, 2:54 PM  CC: Nedra Tinnie LABOR, NP

## 2024-09-03 ENCOUNTER — Other Ambulatory Visit: Payer: Self-pay | Admitting: Nurse Practitioner

## 2024-09-03 ENCOUNTER — Other Ambulatory Visit (HOSPITAL_BASED_OUTPATIENT_CLINIC_OR_DEPARTMENT_OTHER): Payer: Self-pay

## 2024-09-03 MED FILL — Escitalopram Oxalate Tab 10 MG (Base Equiv): ORAL | 90 days supply | Qty: 90 | Fill #0 | Status: CN

## 2024-09-03 NOTE — Progress Notes (Signed)
 [facility]  Savannah Cook 58 year old female January 13, 1966   HOME SLEEP TEST REPORT ( by Watch PAT)   STUDY DATE: 09-01-2024   Received 09-03-2024    ORDERING CLINICIAN:  Dedra Gores, MD  REFERRING CLINICIAN: Tinnie DELENA Marten, NP   CLINICAL INFORMATION/HISTORY: 06-01-2024,  Savannah Cook is a 58 y.o. female patient who is seen upon  PCP NP McElwee's  referral on 06/01/2024 for a suspicion of sleep apnea.    Chief concern according to patient :   my daughter is a Human resources officer, she has seen how my neck and shoulder move when I deep breathe, she has concerns-  I am more tired, I snore loudly, and gained weight since 2008 when I had a PSG  at [facility] and  was dx with snoring and not with apnea  And : I have been on allegra  for allergies.  I have been dx with atrial fibrillation. Ankle edema.  I have higher blood glucose, HBA1c was 5.9 .   She had colon cancer in 2018,  surgical treatment only , no chemo, no radiation,  but she felt achy and sore since.  She has been told she sleeps with her mouth open. She has been struggling with GERD , sleeps on average 6-8 hours  and wakes up once to urinate. Dreams are reportedly frequent/vivid.  The patient wakes up spontaneously/ not with an alarm.  6.30  AM is the usual rise time. She reports not feeling refreshed or restored in AM, with symptoms such as dry mouth, morning headaches, and residual fatigue.  Naps are not taken = no opportunity, and not refreshing.        Epworth sleepiness score:10 /24. FSS at 48/ 63 points.    BMI: 41 kg/m   Neck Circumference: 16   Sleep Summary:   Total Recording Time (hours, min):    7 hours 16 minutes   Total Sleep Time (hours, min):      6 hours 4 minutes           Percent REM (%):      26.4%                                  Respiratory Indices:   Calculated pAHI (per hour):      Following AASM criteria this AHI is 22.6/h.                       REM pAHI:     22.6                                             NREM pAHI:    22.7                          Supine AHI: 21.6/h and nonsupine AHI 25.5/h . Supine sleep was seen for 272 minutes, while non supine sleep was only 92 minutes present.  Prone sleep was associated with the lowest apnea hypopnea index.    Snoring was loud reaching a mean volume of 52 dB.  Oxygen Saturation Statistics:   Oxygen Saturation (%) Mean: 95%              O2 Saturation Range (%):    Between 91% at nadir and a maximal saturation of 99% with 0 minutes of oxygen Saturation (minutes) <89%.           Pulse Rate Statistics:   Pulse Mean (bpm): 79 bpm                Pulse Range:    Between 63 and 115 bpm  Caveat: the watch pat device does not provide data of cardiac rhythm.              IMPRESSION:  This HST confirms the presence of  moderate - severe obstructive sleep apnea without REM sleep or positional dependence and without associated hypoxia.  Loud snoring was recorded.   I recommend to use Positive airway pressure therapy as first option of treatment.  CPAP ResMed autotitration with a pressure window from 6-16 cm water , 2 cm EPR , heated humidification and mask of comfort. .  Weight loss is second step and important to address the underlying risk factors of OSA.     RECOMMENDATION:   I recommend to use Positive airway pressure therapy as first option of treatment.  CPAP ResMed autotitration with a pressure window from 6-16 cm water , 2 cm EPR , heated humidification and mask of comfort. .  Weight loss is second step and important to address the underlying risk factors of OSA.   The main risk factor for this patient is her weight and by BMI of over 40 and moderate-severe OSA, she should qualify for FDA approved weight loss medication ( zepbound ) . However, meeting the FDA criteria does not mean that Insurance will cover the medication.  A BMI of 40 will also  exclude this patient from alternative OSA therapies, dental device or a hypoglossal nerve stimulator.   Any CPAP patient should be reminded to be fully compliant with PAP therapy , (defined as using PAP therapy for more than 4 hours each night ) with the goal to improve sleep related symptoms and decrease long term cardiovascular risks. Any PAP therapy patient should be reminded, that it may take up to 3 months to get fully used to using PAP and it may take 1-2 weeks for an established CPAP user to acclimatize to changes in pressure or mask. The earlier full compliance is achieved, the better long term compliance tends to be.   Please note that untreated obstructive sleep apnea may carry additional perioperative morbidity. Patients with significant obstructive sleep apnea should receive perioperative PAP therapy and the surgical team should be informed of the diagnosis and degree of sleep disordered breathing.  Sleep fragmentation in the presence of normal proportional sleep stages is a nonspecific findings and per se does not signify an intrinsic sleep disorder or a cause for the patient's sleep-related symptoms.  Causes include (but are not limited to) the unfamiliarity of sleeping while recorded by HST device or sleeping in a sleep lab for a full Polysomnography sleep study, but also circadian rhythm disturbances, medication side effects or an underlying mood disorder or medical problem.     INTERPRETING PHYSICIAN:   Dedra Gores, MD  [facility] Sleep Board certified by The [employer] of Sleep Medicine and Diplomate of the [employer] of Sleep Medicine. Board certified In Neurology through the ABPN, Fellow of the [employer] of Neu[employer].

## 2024-09-03 NOTE — Telephone Encounter (Signed)
 Requesting: escitalopram  (LEXAPRO ) 10 MG tablet  Last Visit: 08/10/2024 Next Visit: 09/07/2024 Last Refill: 02/27/2024  Please Advise

## 2024-09-07 ENCOUNTER — Ambulatory Visit: Admitting: Nurse Practitioner

## 2024-09-07 ENCOUNTER — Other Ambulatory Visit (HOSPITAL_BASED_OUTPATIENT_CLINIC_OR_DEPARTMENT_OTHER): Payer: Self-pay

## 2024-09-07 ENCOUNTER — Encounter: Payer: Self-pay | Admitting: Nurse Practitioner

## 2024-09-07 ENCOUNTER — Ambulatory Visit: Payer: Self-pay | Admitting: Nurse Practitioner

## 2024-09-07 VITALS — BP 146/82 | HR 75 | Temp 97.1°F | Ht 62.0 in | Wt 222.8 lb

## 2024-09-07 DIAGNOSIS — F909 Attention-deficit hyperactivity disorder, unspecified type: Secondary | ICD-10-CM | POA: Diagnosis not present

## 2024-09-07 DIAGNOSIS — F32A Depression, unspecified: Secondary | ICD-10-CM

## 2024-09-07 DIAGNOSIS — F419 Anxiety disorder, unspecified: Secondary | ICD-10-CM

## 2024-09-07 DIAGNOSIS — R079 Chest pain, unspecified: Secondary | ICD-10-CM

## 2024-09-07 DIAGNOSIS — R109 Unspecified abdominal pain: Secondary | ICD-10-CM

## 2024-09-07 MED ORDER — OMEPRAZOLE 40 MG PO CPDR
40.0000 mg | DELAYED_RELEASE_CAPSULE | Freq: Every day | ORAL | 1 refills | Status: DC
  Filled 2024-09-07 – 2024-11-25 (×3): qty 30, 30d supply, fill #0
  Filled 2024-11-25: qty 30, 30d supply, fill #1

## 2024-09-07 NOTE — Assessment & Plan Note (Signed)
 Chronic, not controlled. Work-related stress significantly contributes to her anxiety symptoms, including throat tightening and irritability. She already has baseline anxiety that has gotten exacerbated. Recommend a work break starting 09/11/24 for 4 weeks. Encourage contacting HR for FMLA paperwork. Recommend therapy for stress management and coping strategies. Continue lexapro  5mg  daily.

## 2024-09-07 NOTE — Patient Instructions (Signed)
 It was great to see you!  Start omeprazole  (prilosec)  in addition to pepcid    I have placed a referral to a therapist   Let's follow-up at your next visit  Take care,  Tinnie Harada, NP

## 2024-09-07 NOTE — Assessment & Plan Note (Signed)
 EKG showed normal sinus rhythm with no ST or T wave changes, similar to prior EKG. Most likely related to stress, however keep appointment with cardiologist in 2 days.

## 2024-09-07 NOTE — Assessment & Plan Note (Signed)
 Stress exacerbates ADHD symptoms, causing task management difficulties and disorganization. She is hesitant about medication. Recommend therapy for ADHD symptoms and stress management.

## 2024-09-07 NOTE — Progress Notes (Signed)
 EKG interpreted by me on 09/07/24 showed normal sinus rhythm with a heart rate of 70, no ST or T wave changes. Similar to prior EKG.

## 2024-09-07 NOTE — Progress Notes (Signed)
 Established Patient Office Visit  Subjective   Patient ID: Jonna M Stensland, female    DOB: [birth year]  Age: 58 y.o. MRN: [id number]  Chief Complaint  Patient presents with   Stress    Wanting to start the process of FMLA    HPI Discussed the use of AI scribe software for clinical note transcription with the patient, who gave verbal consent to proceed.  History of Present Illness   Blessin KIZZIE COTTEN is a 58 year old female who presents with stress and anxiety related to work conditions.  She experiences a tightening sensation in her throat and chest, particularly at work, which was absent during a recent trip to [place] . Her ADHD symptoms have worsened with increased stress, and she does not want to add medication. She takes Lexapro  daily for mental health support. She has poor eating habits, often skipping meals or eating unhealthy snacks due to work constraints, and has gained 20 pounds since early September. She experiences upper abdominal pain and is concerned about a possible ulcer. She has a history of AFib and takes Diltiazem  for this condition. She is considering FMLA leave due to her current health and work situation. Her social history includes a supportive relationship with her partner, Franky, though she notes increased irritability and tension at home.        09/07/2024    1:21 PM 06/24/2024    9:42 AM 04/02/2024    3:53 PM 02/27/2024    4:45 PM 09/25/2023    4:38 PM  Depression screen PHQ 2/9  Decreased Interest 2 0 0 0 2  Down, Depressed, Hopeless 2 0 0 0 2  PHQ - 2 Score 4 0 0 0 4  Altered sleeping 0  0 0 2  Tired, decreased energy 3  0 0 2  Change in appetite 3  2 1  0  Feeling bad or failure about yourself  0  0 0 2  Trouble concentrating 1  0 0 1  Moving slowly or fidgety/restless 3  0 0 0  Suicidal thoughts 0  0 0 0  PHQ-9 Score 14  2 1 11   Difficult doing work/chores Somewhat difficult  Somewhat difficult Not difficult at all Very difficult      09/07/2024    1:22  PM 04/02/2024    3:53 PM 02/27/2024    4:46 PM 09/25/2023    4:38 PM  GAD 7 : Generalized Anxiety Score  Nervous, Anxious, on Edge 3 0 0 3  Control/stop worrying 3 0 0 2  Worry too much - different things 3 1 0 3  Trouble relaxing 2 0 2 2  Restless 0 0 1 0  Easily annoyed or irritable 2 0 0 0  Afraid - awful might happen 2 0 0 1  Total GAD 7 Score 15 1 3 11   Anxiety Difficulty Very difficult Not difficult at all Not difficult at all Somewhat difficult      ROS See pertinent positives and negatives per HPI.    Objective:     BP (!) 146/82 (BP Location: Left Arm, Cuff Size: Large)   Pulse 75   Temp (!) 97.1 F (36.2 C)   Ht 5' 2 (1.575 m)   Wt 222 lb 12.8 oz (101.1 kg)   LMP 10/06/2022 (Exact Date) Comment: Last cycle was in 07/2021  SpO2 99%   BMI 40.75 kg/m    Physical Exam Vitals and nursing note reviewed.  Constitutional:      General: She is  not in acute distress.    Appearance: Normal appearance.  HENT:     Head: Normocephalic.  Eyes:     Conjunctiva/sclera: Conjunctivae normal.  Cardiovascular:     Rate and Rhythm: Normal rate and regular rhythm.     Pulses: Normal pulses.     Heart sounds: Normal heart sounds.  Pulmonary:     Effort: Pulmonary effort is normal.     Breath sounds: Normal breath sounds.  Musculoskeletal:     Cervical back: Normal range of motion.  Skin:    General: Skin is warm.  Neurological:     General: No focal deficit present.     Mental Status: She is alert and oriented to person, place, and time.  Psychiatric:        Mood and Affect: Mood normal.        Behavior: Behavior normal.        Thought Content: Thought content normal.        Judgment: Judgment normal.   The 10-year ASCVD risk score (Arnett DK, et al., 2019) is: 3.5%    Assessment & Plan:   Problem List Items Addressed This Visit       Other   Anxiety and depression - Primary   Chronic, not controlled. Work-related stress significantly contributes to her  anxiety symptoms, including throat tightening and irritability. She already has baseline anxiety that has gotten exacerbated. Recommend a work break starting 09/11/24 for 4 weeks. Encourage contacting HR for FMLA paperwork. Recommend therapy for stress management and coping strategies. Continue lexapro  5mg  daily.       Relevant Orders   Ambulatory referral to Psychology   Attention deficit hyperactivity disorder (ADHD)   Stress exacerbates ADHD symptoms, causing task management difficulties and disorganization. She is hesitant about medication. Recommend therapy for ADHD symptoms and stress management.       Chest pain   EKG showed normal sinus rhythm with no ST or T wave changes, similar to prior EKG. Most likely related to stress, however keep appointment with cardiologist in 2 days.       Relevant Orders   EKG 12-Lead (Completed)   Other Visit Diagnoses       Stomach pain       Most likely related to anxiety. Start therapy and will have her start omeprazole  40mg  daily in addition to pepcid .       Return if symptoms worsen or fail to improve, for at next scheduled visit .    Tinnie DELENA Harada, NP

## 2024-09-08 ENCOUNTER — Ambulatory Visit
Admission: RE | Admit: 2024-09-08 | Discharge: 2024-09-08 | Disposition: A | Discharge status: Home / Self Care | Source: Ambulatory Visit | Attending: Pulmonary Disease | Admitting: Pulmonary Disease

## 2024-09-08 ENCOUNTER — Other Ambulatory Visit (HOSPITAL_BASED_OUTPATIENT_CLINIC_OR_DEPARTMENT_OTHER): Payer: Self-pay

## 2024-09-08 ENCOUNTER — Ambulatory Visit (HOSPITAL_BASED_OUTPATIENT_CLINIC_OR_DEPARTMENT_OTHER)

## 2024-09-08 DIAGNOSIS — J849 Interstitial pulmonary disease, unspecified: Secondary | ICD-10-CM

## 2024-09-09 ENCOUNTER — Other Ambulatory Visit (HOSPITAL_COMMUNITY): Payer: Self-pay

## 2024-09-09 ENCOUNTER — Ambulatory Visit: Payer: Self-pay | Admitting: Neurology

## 2024-09-09 DIAGNOSIS — K219 Gastro-esophageal reflux disease without esophagitis: Secondary | ICD-10-CM

## 2024-09-09 DIAGNOSIS — Z6841 Body Mass Index (BMI) 40.0 and over, adult: Secondary | ICD-10-CM

## 2024-09-09 DIAGNOSIS — I4892 Unspecified atrial flutter: Secondary | ICD-10-CM

## 2024-09-09 DIAGNOSIS — I480 Paroxysmal atrial fibrillation: Secondary | ICD-10-CM

## 2024-09-09 DIAGNOSIS — G4733 Obstructive sleep apnea (adult) (pediatric): Secondary | ICD-10-CM

## 2024-09-09 DIAGNOSIS — R519 Headache, unspecified: Secondary | ICD-10-CM

## 2024-09-09 NOTE — Procedures (Signed)
 [facility]   Savannah Cook 58 year old female 10/02/1966   HOME SLEEP TEST REPORT ( by Watch PAT)   STUDY DATE: 09-01-2024    Received 09-03-2024     ORDERING CLINICIAN:  Dedra Gores, MD  REFERRING CLINICIAN: Tinnie DELENA Marten, NP   CLINICAL INFORMATION/HISTORY: 06-01-2024,  Savannah Cook is a 58 y.o. female patient who is seen upon  PCP NP McElwee's  referral on 06/01/2024 for a suspicion of sleep apnea.    Chief concern according to patient :   my daughter is a Human resources officer, she has seen how my neck and shoulder move when I deep breathe, she has concerns-  I am more tired, I snore loudly, and gained weight since 2008 when I had a PSG  at [facility] and  was dx with snoring and not with apnea  And : I have been on allegra  for allergies.  I have been dx with atrial fibrillation. Ankle edema.  I have higher blood glucose, HBA1c was 5.9 .   She had colon cancer in 2018,  surgical treatment only , no chemo, no radiation,  but she felt achy and sore since.  She has been told she sleeps with her mouth open. She has been struggling with GERD , sleeps on average 6-8 hours  and wakes up once to urinate. Dreams are reportedly frequent/vivid.  The patient wakes up spontaneously/ not with an alarm.  6.30  AM is the usual rise time. She reports not feeling refreshed or restored in AM, with symptoms such as dry mouth, morning headaches, and residual fatigue.  Naps are not taken = no opportunity, and not refreshing.         Epworth sleepiness score:10 /24. FSS at 48/ 63 points.    BMI: 41 kg/m   Neck Circumference: 16   Sleep Summary:   Total Recording Time (hours, min):    7 hours 16 minutes   Total Sleep Time (hours, min):      6 hours 4 minutes           Percent REM (%):      26.4%                                  Respiratory Indices:   Calculated pAHI (per hour):      Following AASM criteria this AHI is 22.6/h.                       REM pAHI:     22.6                                             NREM pAHI:    22.7                          Supine AHI: 21.6/h and nonsupine AHI 25.5/h . Supine sleep was seen for 272 minutes, while non supine sleep was only 92 minutes present.  Prone sleep was associated with the lowest apnea hypopnea index.      Snoring was loud reaching a mean volume of 52 dB.  Oxygen Saturation Statistics:   Oxygen Saturation (%) Mean: 95%              O2 Saturation Range (%):    Between 91% at nadir and a maximal saturation of 99% with 0 minutes of oxygen Saturation (minutes) <89%.           Pulse Rate Statistics:   Pulse Mean (bpm): 79 bpm                Pulse Range:    Between 63 and 115 bpm   Caveat: the watch pat device does not provide data of cardiac rhythm.              IMPRESSION:  This HST confirms the presence of  moderate - severe obstructive sleep apnea without REM sleep or positional dependence and without associated hypoxia.  Loud snoring was recorded.    I recommend to use Positive airway pressure therapy as first option of treatment.  CPAP ResMed autotitration with a pressure window from 6-16 cm water , 2 cm EPR , heated humidification and mask of comfort. .  Weight loss is second step and important to address the underlying risk factors of OSA.      RECOMMENDATION:    I recommend to use Positive airway pressure therapy as first option of treatment.  CPAP ResMed autotitration with a pressure window from 6-16 cm water , 2 cm EPR , heated humidification and mask of comfort. .  Weight loss is second step and important to address the underlying risk factors of OSA.    The main risk factor for this patient is her weight and by BMI of over 40 and moderate-severe OSA, she should qualify for FDA approved weight loss medication ( zepbound ) . However, meeting the FDA criteria does not mean that Insurance will cover the medication.   A BMI of 40 will also  exclude this patient from alternative OSA therapies, dental device or a hypoglossal nerve stimulator.    Any CPAP patient should be reminded to be fully compliant with PAP therapy , (defined as using PAP therapy for more than 4 hours each night ) with the goal to improve sleep related symptoms and decrease long term cardiovascular risks. Any PAP therapy patient should be reminded, that it may take up to 3 months to get fully used to using PAP and it may take 1-2 weeks for an established CPAP user to acclimatize to changes in pressure or mask. The earlier full compliance is achieved, the better long term compliance tends to be.    Please note that untreated obstructive sleep apnea may carry additional perioperative morbidity. Patients with significant obstructive sleep apnea should receive perioperative PAP therapy and the surgical team should be informed of the diagnosis and degree of sleep disordered breathing.  Sleep fragmentation in the presence of normal proportional sleep stages is a nonspecific findings and per se does not signify an intrinsic sleep disorder or a cause for the patient's sleep-related symptoms.  Causes include (but are not limited to) the unfamiliarity of sleeping while recorded by HST device or sleeping in a sleep lab for a full Polysomnography sleep study, but also circadian rhythm disturbances, medication side effects or an underlying mood disorder or medical problem.      INTERPRETING PHYSICIAN:    Dedra Gores, MD  [facility] Sleep Board certified by The [employer] of Sleep Medicine and Diplomate of the [employer] of Sleep Medicine. Board certified In Neurology  through the ABPN, Fellow of the [employer] of Neu[employer].

## 2024-09-10 ENCOUNTER — Telehealth: Payer: Self-pay | Admitting: Pharmacy Technician

## 2024-09-10 ENCOUNTER — Encounter (HOSPITAL_BASED_OUTPATIENT_CLINIC_OR_DEPARTMENT_OTHER): Payer: Self-pay | Admitting: Nurse Practitioner

## 2024-09-10 ENCOUNTER — Ambulatory Visit (HOSPITAL_BASED_OUTPATIENT_CLINIC_OR_DEPARTMENT_OTHER): Admitting: Nurse Practitioner

## 2024-09-10 ENCOUNTER — Telehealth: Payer: Self-pay | Admitting: Nurse Practitioner

## 2024-09-10 ENCOUNTER — Encounter: Payer: Self-pay | Admitting: Pharmacy Technician

## 2024-09-10 ENCOUNTER — Other Ambulatory Visit: Payer: Self-pay

## 2024-09-10 ENCOUNTER — Other Ambulatory Visit (HOSPITAL_COMMUNITY): Payer: Self-pay

## 2024-09-10 ENCOUNTER — Other Ambulatory Visit (HOSPITAL_BASED_OUTPATIENT_CLINIC_OR_DEPARTMENT_OTHER): Payer: Self-pay

## 2024-09-10 ENCOUNTER — Ambulatory Visit (HOSPITAL_BASED_OUTPATIENT_CLINIC_OR_DEPARTMENT_OTHER)

## 2024-09-10 VITALS — BP 120/80 | HR 87 | Resp 17 | Ht 62.0 in | Wt 221.0 lb

## 2024-09-10 DIAGNOSIS — I2510 Atherosclerotic heart disease of native coronary artery without angina pectoris: Secondary | ICD-10-CM

## 2024-09-10 DIAGNOSIS — G4733 Obstructive sleep apnea (adult) (pediatric): Secondary | ICD-10-CM

## 2024-09-10 DIAGNOSIS — R072 Precordial pain: Secondary | ICD-10-CM

## 2024-09-10 DIAGNOSIS — E785 Hyperlipidemia, unspecified: Secondary | ICD-10-CM

## 2024-09-10 DIAGNOSIS — I480 Paroxysmal atrial fibrillation: Secondary | ICD-10-CM

## 2024-09-10 DIAGNOSIS — Z7189 Other specified counseling: Secondary | ICD-10-CM

## 2024-09-10 DIAGNOSIS — I471 Supraventricular tachycardia, unspecified: Secondary | ICD-10-CM

## 2024-09-10 MED ORDER — ROSUVASTATIN CALCIUM 5 MG PO TABS
5.0000 mg | ORAL_TABLET | Freq: Every day | ORAL | 3 refills | Status: DC
  Filled 2024-09-10: qty 90, 90d supply, fill #0

## 2024-09-10 MED ORDER — ZEPBOUND 2.5 MG/0.5ML ~~LOC~~ SOAJ
2.5000 mg | SUBCUTANEOUS | 11 refills | Status: DC
  Filled 2024-09-10: qty 2, 28d supply, fill #0

## 2024-09-10 MED ORDER — ROSUVASTATIN CALCIUM 5 MG PO TABS
5.0000 mg | ORAL_TABLET | Freq: Every day | ORAL | 3 refills | Status: AC
  Filled 2024-09-10: qty 90, 90d supply, fill #0
  Filled 2024-11-25: qty 90, 90d supply, fill #1
  Filled 2024-11-25: qty 90, 90d supply, fill #0

## 2024-09-10 NOTE — Telephone Encounter (Signed)
 ERROR

## 2024-09-10 NOTE — Telephone Encounter (Signed)
 Pharmacy Patient Advocate Encounter  Received notification from [employer] that Prior Authorization for zepbound  has been DENIED.  Full denial letter will be uploaded to the media tab. See denial reason below.   PA #/Case ID/Reference #: [id number]

## 2024-09-10 NOTE — Telephone Encounter (Signed)
 Pharmacy Patient Advocate Encounter   Received notification from Fax that prior authorization for zepbound  2.5 MG is required/requested.   Insurance verification completed.   The patient is insured through [employer] .   Per test claim: PA required; PA submitted to above mentioned insurance via Latent Key/confirmation #/EOC [id number] Status is pending

## 2024-09-10 NOTE — Telephone Encounter (Signed)
 Pt is wanting to put a hold on her fmla process. She wants to put on my big girl panties and deal with it. She is wanting to get more finically sound before she moves forward with this.

## 2024-09-10 NOTE — Telephone Encounter (Addendum)
   Ran test claim for wegovy. For a 28 day supply and the co-pay is 1,538.13 . PA is not needed at this time. Nothing saying this is a transition fill.   This test claim was processed through [facility]- copay amounts may vary at other pharmacies due to pharmacy/plan contracts, or as the patient moves through the different stages of their insurance plan.        Lmom for pt

## 2024-09-10 NOTE — Progress Notes (Signed)
 Cardiology Office Note   Date:  09/10/2024  ID:  LAYCI STENGLEIN, DOB August 01, 1966, MRN [id number] PCP: Nedra Tinnie LABOR, NP  [facility] Providers Cardiologist:  Shelda Bruckner, MD Cardiology APP:  Dow Arland BROCKS, NP (Inactive)     PMH Atrial fibrillation/flutter GERD Colon cancer Iron deficiency anemia Obesity Former tobacco abuse Quit 1987 Coronary artery calcification CT Calcium  score 07/14/24 CAC score 37.6 (83rd percentile) LM 0, LAD 0, LCx 0, RCA 37.6  Initially seen by Arland Dow, NP in A-fib clinic.  She noted palpitations and was noted to be in typical atrial flutter in PCP office in February 2023.  She was taking several supplements at the time and had been started on Cardizem  30 mg 3 times daily and Eliquis  5 mg twice daily for CHA2DS2-VASc score of 1.  Cardiac monitor shows intermittent atrial flutter.  EKG on 03/13/2022 showed atrial fibrillation at 143 bpm.  She had reduced Eliquis  to once daily.  Sleep study was ordered but she had a very high deductible and insurance did not approve it.  She was advised to stop Eliquis  given CHA2DS2-VASc score of 1 and return to sinus rhythm.  She obtained a Kardia device to help monitor for arrhythmias at home.  She established care with Dr. Bruckner 06/2022.  She was struggling with anxiety, depression, and associated tension headaches.  She felt the symptoms exacerbated A-fib.  Last cardiology clinic visit was 06/17/2023 with Dr. Bruckner. Asked about starting turmeric for stiffness in her legs.  She also complained of left foot swelling by the end of the day.  Noted whenever she was anxious/stressed at work or rushing, she will start to flutter associated with cough at onset.  Palpitations may last up to 30 minutes.  At PCP office visit 05/17/2023 she was found to be in asymptomatic atrial fibrillation at 119 bpm.  She endorsed snoring and according to her fianc it had worsened recently.  Management strategies were  discussed and low-dose diltiazem  was started.  Continue to consider sleep study once affordable. 10-year ASCVD risk score 6.9%.  She was not being treated for hypertension. Heart healthy lifestyle modification encouraged and 1 year follow-up.  Seen by me on 06/16/24 for follow-up. Was diagnosed with atrial fibrillation in 2023 and has been on diltiazem  120 mg daily. Recently, she started taking it every other day to evaluate its effects. Has not experienced recent heart flutter episodes or palpitations and questions the necessity of the medication. Experiencing heaviness and tiredness in her chest and upper legs at times and bilateral foot edema. Admits not as active as she would like to be due to limitations from Planter fasciitis. Weight gain since menopause in 2022, which she attributes to hormonal changes. No DOE, orthopnea, PND, presyncope, syncope. Suspects sleep apnea due to loud snoring and unrefreshing sleep but has been unable to obtain a sleep study due to insurance issues. Is motivated to improve her health through diet and exercise, aiming to walk daily and use her gym membership, which includes pools and a hot tub. She consumes two cups of coffee daily and occasionally uses a caffeine supplement called Zipfizz without feeling jittery. She takes vitamin D , elderberry, and a multivitamin for those over 55. She prefers to minimize medication use.   CT calcium  score obtained for further risk stratification with CAC of 37.6 (87th percentile).  She was advised to start rosuvastatin  5 mg daily for LDL 114 on 07/07/2024. Lung findings reveal minimal fibrotic interstitial lung disease, mostly new when  compared to exam in 2018.  Referred to pulmonology by PCP and seen by Dr. Theophilus on 09/02/2024.  Diagnosed with moderate to severe obstructive sleep apnea with recommendation for CPAP.    History of Present Illness Discussed the use of AI scribe software for clinical note transcription with the patient, who  gave verbal consent to proceed.  History of Present Illness Glena LILAH MIJANGOS is a very pleasant 58 year old female who is here today to discuss her cardiovascular health. She admits to a very stressful time at work recently due to a change in management.  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  During this time she was experiencing chest tightness and a general feeling of unwell.  She has continued diltiazem  120 mg every other day until 4 days ago.  She has not experienced worsening fluttering.  She reports a recent 30 lb weight gain and would like to start Zepbound  for weight management. Her insurance initially denied coverage for Zepbound , but she is hopeful for approval due to her BMI and comorbid conditions.  She has been dealing with various health concerns including seeing pulmonology for findings concerning for interstitial lung disease on CT, having a sleep test with neurology, and concerns about weight gain and general cardiovascular health. She denies dyspnea, orthopnea, PND, presyncope or syncope.  No significant chest pain with exertion.   ROS: See HPI  Studies Reviewed       No results found for: LIPOA  Risk Assessment/Calculations  CHA2DS2-VASc Score = 1   This indicates a 0.6% annual risk of stroke. The patient's score is based upon: CHF History: 0 HTN History: 0 Diabetes History: 0 Stroke History: 0 Vascular Disease History: 0 Age Score: 0 Gender Score: 1             Physical Exam VS:  BP 120/80 (BP Location: Left Arm, Patient Position: Sitting, Cuff Size: Large)   Pulse 87   Resp 17   Ht 5' 2 (1.575 m)   Wt 221 lb (100.2 kg)   LMP 10/06/2022 (Exact Date) Comment: Last cycle was in 07/2021  SpO2 96%   BMI 40.42 kg/m    Wt Readings from Last 3 Encounters:  09/10/24 221 lb (100.2 kg)  09/07/24 222 lb 12.8 oz (101.1 kg)  09/02/24 224 lb (101.6 kg)    GEN: Obese, well developed in no acute distress NECK: No JVD;  No carotid bruits CARDIAC: RRR, no murmurs, rubs, gallops RESPIRATORY:  Clear to auscultation without rales, wheezing or rhonchi  ABDOMEN: Soft, non-tender, non-distended EXTREMITIES:  No edema; No deformity   Assessment & Plan Paroxysmal Atrial Fibrillation SVT Cardiac monitor 02/2022 revealed multiple episodes of PAF, longest episode of atrial flutter was 6 hours 20 minutes, rare episodes of SVT lasting up to 21 seconds.  Since that time, overall A-fib burden has been low.  She has been experiencing more chest tightness recently but no significant palpitations.  She has been taking diltiazem  every other day until recently.  She ran out of the medication 4 days and has not taken any diltiazem .  She denies significant palpitations.  Due to recent diagnosis of OSA, I recommend she resume diltiazem  120 mg at least every other day for management of arrhythmias.  She has not taken diltiazem  30 mg as needed. Consider anticoagulation therapy may be necessary at age 21 due to increased stroke risk.  -Continue diltiazem  120 mg every other day -Recommendation to work on lifestyle modification  to improve diet, incorporate regular exercise and aim for weight loss  Chest pain Cardiac Risk Assessment CT calcium  score of 37.6 (83rd percentile) with all calcification in RCA.  We reviewed this result in detail.  She has been having chest tightness associated with stress recently but no dyspnea or chest pain that worsens with exertion concerning for angina. BP is well controlled.  Recommendation to start rosuvastatin  for LDL above goal. -Continue lifestyle modification to incorporate more exercise, healthy, plant forward diet, weight loss for ASCVD risk reduction  Hyperlipidemia LDL goal < 70 Lipid panel completed 04/02/2024 with total cholesterol 217, HDL 57, LDL 125, and triglycerides 210.  LDL goal is 70 or lower.  She is agreeable to start statin therapy. - Start rosuvastatin  5 mg daily with heaviest meal -  Plan repeat lipid panel and ALT in 2 to 3 months  Obesity She is struggling with weight gain of 30 LB recently. She would like to pursue GLP-1 agonist therapy for weight loss. We will attempt approval through her insurance. -Lengthy discussion about the importance of diet and exercise for weight loss -Aim for at least 150 minutes of moderate intensity exercise each week -Aim to lift weights at least 20 to 30 minutes 3 days a week -Heart healthy, mostly whole food diet avoiding saturated fat, processed foods, sugar, and other simple carbohydrates -Seek coverage for GLP-1  Sleep Apnea  Home sleep study completed by neurology reveals OSA with recommendation for CPAP.  She plans to proceed with treatment.  Encouraged management of sleep apnea for heart health. - Management per neurology        Dispo: 6 months with me  Signed, Rosaline Bane, NP-C

## 2024-09-10 NOTE — Patient Instructions (Signed)
 Medication Instructions:   START Rosuvastatin  one ( 1) tablet by mouth ( 5 mg) with supper.   Will follow up in the Zepbound .   *If you need a refill on your cardiac medications before your next appointment, please call your pharmacy*  Lab Work:  None ordered.  If you have labs (blood work) drawn today and your tests are completely normal, you will receive your results only by: MyChart Message (if you have MyChart) OR A paper copy in the mail If you have any lab test that is abnormal or we need to change your treatment, we will call you to review the results.  Testing/Procedures:  None ordered.   Follow-Up: At [facility], you and your health needs are our priority.  As part of our continuing mission to provide you with exceptional heart care, our providers are all part of one team.  This team includes your primary Cardiologist (physician) and Advanced Practice Providers or APPs (Physician Assistants and Nurse Practitioners) who all work together to provide you with the care you need, when you need it.  Your next appointment:   To be determined   Provider:   Rosaline Bane, NP    We recommend signing up for the patient portal called MyChart.  Sign up information is provided on this After Visit Summary.  MyChart is used to connect with patients for Virtual Visits (Telemedicine).  Patients are able to view lab/test results, encounter notes, upcoming appointments, etc.  Non-urgent messages can be sent to your provider as well.   To learn more about what you can do with MyChart, go to [email or web address].

## 2024-09-10 NOTE — Telephone Encounter (Signed)
 Talked to the patient and she only wants zepbound . Only option is for a coupon that would make it 499.00 she said that is not an option. She is looking into other insurances.

## 2024-09-10 NOTE — Telephone Encounter (Signed)
 Is there an alternative such as Wegovy offered? She has BMI of 40, history of sleep apnea, a fib and coronary calcification.

## 2024-09-14 ENCOUNTER — Encounter (HOSPITAL_BASED_OUTPATIENT_CLINIC_OR_DEPARTMENT_OTHER): Payer: Self-pay | Admitting: *Deleted

## 2024-09-14 ENCOUNTER — Other Ambulatory Visit (HOSPITAL_BASED_OUTPATIENT_CLINIC_OR_DEPARTMENT_OTHER): Payer: Self-pay | Admitting: *Deleted

## 2024-09-14 ENCOUNTER — Encounter: Payer: Self-pay | Admitting: Nurse Practitioner

## 2024-09-14 DIAGNOSIS — I2510 Atherosclerotic heart disease of native coronary artery without angina pectoris: Secondary | ICD-10-CM

## 2024-09-14 DIAGNOSIS — Z7189 Other specified counseling: Secondary | ICD-10-CM

## 2024-09-14 DIAGNOSIS — E785 Hyperlipidemia, unspecified: Secondary | ICD-10-CM

## 2024-09-15 ENCOUNTER — Encounter: Payer: Self-pay | Admitting: Nurse Practitioner

## 2024-09-15 ENCOUNTER — Other Ambulatory Visit (HOSPITAL_BASED_OUTPATIENT_CLINIC_OR_DEPARTMENT_OTHER): Payer: Self-pay

## 2024-09-15 ENCOUNTER — Telehealth: Admitting: Nurse Practitioner

## 2024-09-15 ENCOUNTER — Other Ambulatory Visit (HOSPITAL_COMMUNITY): Payer: Self-pay

## 2024-09-15 ENCOUNTER — Ambulatory Visit: Payer: Self-pay | Admitting: Pulmonary Disease

## 2024-09-15 VITALS — Temp 98.0°F | Ht 62.0 in | Wt 222.0 lb

## 2024-09-15 DIAGNOSIS — E782 Mixed hyperlipidemia: Secondary | ICD-10-CM | POA: Diagnosis not present

## 2024-09-15 DIAGNOSIS — U071 COVID-19: Secondary | ICD-10-CM | POA: Diagnosis not present

## 2024-09-15 DIAGNOSIS — J849 Interstitial pulmonary disease, unspecified: Secondary | ICD-10-CM

## 2024-09-15 MED ORDER — PROMETHAZINE-DM 6.25-15 MG/5ML PO SYRP
5.0000 mL | ORAL_SOLUTION | Freq: Four times a day (QID) | ORAL | 0 refills | Status: DC | PRN
  Filled 2024-09-15: qty 118, 6d supply, fill #0
  Filled 2024-09-15: qty 58, 3d supply, fill #0
  Filled 2024-09-15: qty 60, 3d supply, fill #0
  Filled 2024-09-15: qty 118, 6d supply, fill #0

## 2024-09-15 NOTE — Progress Notes (Unsigned)
 [facility] [address] [place] [address] Dept: [phone] Dept Fax: [phone]  Virtual Video Visit  I connected with Loa CHRISTELLA Mulberry on 09/16/24 at 11:40 AM EDT by a video enabled telemedicine application and verified that I am speaking with the correct person using two identifiers.  Location patient: Home Location provider: Clinic Persons participating in the virtual visit: Patient; Savannah Harada, NP; Laymon Gladis Sharps, CMA  I discussed the limitations of evaluation and management by telemedicine and the availability of in person appointments. The patient expressed understanding and agreed to proceed.  Chief Complaint  Patient presents with   Covid Positive    Feels heaviness in chest    SUBJECTIVE:  HPI:   Discussed the use of AI scribe software for clinical note transcription with the patient, who gave verbal consent to proceed.  History of Present Illness   Alicja ARINE FOLEY is a 58 year old female who presents with symptoms of COVID-19.  She experiences severe headache, chest tightness, cough, and fatigue, consistent with COVID-19. Symptoms began late Thursday. She tested positive for COVID-19 twice after a coworker tested positive. She has no fever but significant fatigue and a worsening cough upon waking.  She manages symptoms with Tylenol  Cold and Flu and has delayed starting rosuvastatin . She lives with her partner, Franky, who is also exposed and being tested. 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      Patient Active Problem List   Diagnosis Date Noted   COVID-19 09/16/2024   Attention deficit hyperactivity disorder (ADHD) 09/07/2024   Chest pain 09/07/2024   Abnormal uterine bleeding 08/10/2024   Abnormal CT scan 08/10/2024   Morning headache, controlled 06/01/2024   Pain in soft tissues of limb 06/01/2024   Hyperreflexia of lower extremity  06/01/2024   Snoring 06/01/2024   GERD with apnea 06/01/2024   Family history of rheumatoid arthritis 06/01/2024   Vitamin D  deficiency 04/02/2024   Seasonal allergies 02/27/2024   Tremor 10/24/2023   History of colon cancer 09/25/2023   Prediabetes 09/25/2023   Mixed hyperlipidemia 05/17/2023   Paroxysmal atrial fibrillation (HCC) 08/08/2022   Paroxysmal SVT (supraventricular tachycardia) 08/08/2022   Rosacea 01/31/2022   Atrial flutter (HCC) 01/31/2022   Routine general medical examination at a health care facility 01/15/2021   Anxiety and depression 01/15/2021   Hx of colonic polyp - sessile serrated 10/08/2018   Hair loss 01/18/2017   Iron deficiency anemia 01/18/2017   Morbid obesity (HCC) 05/14/2013    Past Surgical History:  Procedure Laterality Date   CHOLECYSTECTOMY N/A 07/25/2017   Procedure: CHOLECYSTECTOMY;  Surgeon: Lily Boas, MD;  Location: [facility];  Service: General;  Laterality: N/A;   COLON SURGERY  07/25/2017   COLONOSCOPY  06/10/2018   w/Dr.Gessner    LAPAROSCOPIC PARTIAL COLECTOMY N/A 07/25/2017   Procedure: LAPAROSCOPIC PARTIAL COLECTOMY;  Surgeon: Lily Boas, MD;  Location: [facility];  Service: General;  Laterality: N/A;   UPPER GASTROINTESTINAL ENDOSCOPY  06/10/2018   WISDOM TOOTH EXTRACTION     WRIST SURGERY Right 04/2012    Family History  Problem Relation Age of Onset   Diabetes Mother    Hypertension Mother    Bipolar disorder Mother    COPD Mother    Anxiety disorder Mother    Depression Mother    Obesity Mother    Diabetes Father    Hypertension Father    Lung cancer Father  Tremor Father        essential   Arthritis Father    Cancer Father    Heart disease Maternal Grandmother    Colon cancer Maternal Grandfather 95   Stroke Paternal Grandmother    Colon polyps Neg Hx    Esophageal cancer Neg Hx    Rectal cancer Neg Hx    Stomach cancer Neg Hx     Social History   Tobacco Use   Smoking status: Former    Current  packs/day: 0.00    Types: Cigarettes    Quit date: 12/17/1985    Years since quitting: 38.7   Smokeless tobacco: Never   Tobacco comments:    Former smoker 04/19/22  Vaping Use   Vaping status: Never Used  Substance Use Topics   Alcohol use: Not Currently   Drug use: No     Current Outpatient Medications:    acetaminophen  (TYLENOL ) 500 MG tablet, Take 500 mg by mouth every 6 (six) hours as needed for moderate pain, fever, headache or mild pain., Disp: , Rfl:    Cholecalciferol (VITAMIN D3 PO), Take 2,000 Units by mouth daily., Disp: , Rfl:    clindamycin  (CLEOCIN  T) 1 % external solution, Apply topically 2 (two) times daily. (Patient taking differently: Apply topically 2 (two) times daily. PRN), Disp: 60 mL, Rfl: 0   Collagen-Vitamin C-Biotin (COLLAGEN PO), Take 1 tablet by mouth every other day., Disp: , Rfl:    diltiazem  (CARDIZEM  CD) 120 MG 24 hr capsule, Take 1 capsule (120 mg total) by mouth daily., Disp: 90 capsule, Rfl: 3   diltiazem  (CARDIZEM ) 30 MG tablet, Take 1 tablet (30 mg total) by mouth 2 (two) times daily for palpitations., Disp: 60 tablet, Rfl: 6   ELDERBERRY PO, Take by mouth. Every day  2000 mg, Disp: , Rfl:    escitalopram  (LEXAPRO ) 10 MG tablet, Take 1 tablet (10 mg total) by mouth daily., Disp: 90 tablet, Rfl: 1   fexofenadine  (FT ALLERGY RELIEF 24 HOUR) 180 MG tablet, Take 1 tablet (180 mg total) by mouth daily., Disp: 90 tablet, Rfl: 1   ibuprofen  (ADVIL ) 800 MG tablet, Take 1 tablet (800 mg total) by mouth 2 (two) times daily as needed for pain., Disp: 60 tablet, Rfl: 0   Multiple Vitamin (MULTIVITAMIN ADULT PO), Take 1 tablet by mouth every morning. One daily- Every Woman's 55 plus- [employer], Disp: , Rfl:    omeprazole  (PRILOSEC) 40 MG capsule, Take 1 capsule (40 mg total) by mouth daily., Disp: 30 capsule, Rfl: 1   OVER THE COUNTER MEDICATION, Tylenol  Cold and Sinus as needed, Disp: , Rfl:    promethazine-dextromethorphan (PROMETHAZINE-DM) 6.25-15 MG/5ML  syrup, Take 5 mLs by mouth 4 (four) times daily as needed., Disp: 118 mL, Rfl: 0   tirzepatide  (ZEPBOUND ) 2.5 MG/0.5ML Pen, Inject 2.5 mg into the skin once a week., Disp: 2 mL, Rfl: 11   rosuvastatin  (CRESTOR ) 5 MG tablet, Take 1 tablet (5 mg total) by mouth daily before supper. (Patient not taking: Reported on 09/15/2024), Disp: 90 tablet, Rfl: 3  Allergies  Allergen Reactions   Ceclor [Cefaclor] Rash    ROS: See pertinent positives and negatives per HPI.  OBSERVATIONS/OBJECTIVE:  VITALS per patient if applicable: Today's Vitals   09/15/24 1155  Temp: 98 F (36.7 C)  TempSrc: Oral  Weight: 222 lb (100.7 kg)  Height: 5' 2 (1.575 m)   Body mass index is 40.6 kg/m.    GENERAL: Alert and oriented. Appears sick, fatigued, and  in no acute distress.  HEENT: Atraumatic. Conjunctiva clear. No obvious abnormalities on inspection of external nose and ears.  NECK: Normal movements of the head and neck.  LUNGS: On inspection, no signs of respiratory distress. Breathing rate appears normal. No obvious gross SOB, gasping or wheezing, and no conversational dyspnea. Coughing frequently during visit.  CV: No obvious cyanosis.  MS: Moves all visible extremities without noticeable abnormality.  PSYCH/NEURO: Pleasant and cooperative. No obvious depression or anxiety. Speech and thought processing grossly intact.  ASSESSMENT AND PLAN:  Problem List Items Addressed This Visit       Other   Mixed hyperlipidemia   Management with rosuvastatin  is deferred due to COVID-19 illness. Plan to initiate rosuvastatin  once she is free of COVID-19 symptoms to monitor for adverse effects. Advise starting rosuvastatin  post-symptom resolution. Plan to check liver function tests one to two months after starting rosuvastatin .       COVID-19 - Primary   Acute COVID-19 is confirmed by two positive tests, presenting with headache, chest tightness, cough, and fatigue. Antiviral treatment is not initiated due  to timing. Advise rest and increased fluid intake. Continue Tylenol  Cold and Flu for symptom management. Prescribe promethazine DM for cough, every four hours as needed. Provide a work note for absence until Monday, October 6th.      I discussed the assessment and treatment plan with the patient. The patient was provided an opportunity to ask questions and all were answered. The patient agreed with the plan and demonstrated an understanding of the instructions.   The patient was advised to call back or seek an in-person evaluation if the symptoms worsen or if the condition fails to improve as anticipated.   Savannah DELENA Harada, NP

## 2024-09-15 NOTE — Telephone Encounter (Signed)
 Copied from CRM [id number]. Topic: General - Other >> Sep 15, 2024  9:14 AM Frederich PARAS wrote: Reason for CRM: pt called to speak to dr, Nedra or her assistant. Pt also reached ou yesterday in regards to speaking to dr.  BERNELL adv she was with a pt. Please reach out to pt  in regards to [facility] messages via [phone]

## 2024-09-15 NOTE — Patient Instructions (Signed)
 It was great to see you!  Keep drinking plenty of fluids and get rest   You can keep taking tylenol  cold and sinus as needed   I have sent a work note to your mychart   Let's follow-up if your symptoms worsen or don't improve or worsen   Take care,  Tinnie Harada, NP

## 2024-09-15 NOTE — Telephone Encounter (Signed)
 RS CPE from 09/16/24 to 09/25/24 per pt request as she has Covid.

## 2024-09-16 ENCOUNTER — Encounter: Admitting: Nurse Practitioner

## 2024-09-16 DIAGNOSIS — U071 COVID-19: Secondary | ICD-10-CM | POA: Insufficient documentation

## 2024-09-16 NOTE — Assessment & Plan Note (Signed)
 Acute COVID-19 is confirmed by two positive tests, presenting with headache, chest tightness, cough, and fatigue. Antiviral treatment is not initiated due to timing. Advise rest and increased fluid intake. Continue Tylenol  Cold and Flu for symptom management. Prescribe promethazine DM for cough, every four hours as needed. Provide a work note for absence until Monday, October 6th.

## 2024-09-16 NOTE — Assessment & Plan Note (Signed)
 Management with rosuvastatin  is deferred due to COVID-19 illness. Plan to initiate rosuvastatin  once she is free of COVID-19 symptoms to monitor for adverse effects. Advise starting rosuvastatin  post-symptom resolution. Plan to check liver function tests one to two months after starting rosuvastatin .

## 2024-09-17 LAB — HM MAMMOGRAPHY

## 2024-09-18 ENCOUNTER — Encounter: Payer: Self-pay | Admitting: Nurse Practitioner

## 2024-09-18 ENCOUNTER — Ambulatory Visit: Admitting: Pulmonary Disease

## 2024-09-21 NOTE — Telephone Encounter (Signed)
 Cauy Melody D, CMA  Zott, Gasper Ona, Tammy; Darrel Boyer New orders have been placed for the above pt, DOB: 09/09/1966 Thanks

## 2024-09-23 ENCOUNTER — Ambulatory Visit: Admitting: Pulmonary Disease

## 2024-09-24 ENCOUNTER — Other Ambulatory Visit (HOSPITAL_BASED_OUTPATIENT_CLINIC_OR_DEPARTMENT_OTHER): Payer: Self-pay

## 2024-09-25 ENCOUNTER — Ambulatory Visit: Admitting: Nurse Practitioner

## 2024-09-25 ENCOUNTER — Encounter: Payer: Self-pay | Admitting: Nurse Practitioner

## 2024-09-25 ENCOUNTER — Ambulatory Visit: Payer: Self-pay | Admitting: Nurse Practitioner

## 2024-09-25 VITALS — BP 118/84 | HR 77 | Temp 97.3°F | Ht 62.0 in | Wt 224.6 lb

## 2024-09-25 DIAGNOSIS — Z0000 Encounter for general adult medical examination without abnormal findings: Secondary | ICD-10-CM

## 2024-09-25 DIAGNOSIS — E559 Vitamin D deficiency, unspecified: Secondary | ICD-10-CM | POA: Diagnosis not present

## 2024-09-25 DIAGNOSIS — R7303 Prediabetes: Secondary | ICD-10-CM

## 2024-09-25 DIAGNOSIS — K219 Gastro-esophageal reflux disease without esophagitis: Secondary | ICD-10-CM

## 2024-09-25 DIAGNOSIS — G4733 Obstructive sleep apnea (adult) (pediatric): Secondary | ICD-10-CM

## 2024-09-25 DIAGNOSIS — Z6841 Body Mass Index (BMI) 40.0 and over, adult: Secondary | ICD-10-CM

## 2024-09-25 DIAGNOSIS — I480 Paroxysmal atrial fibrillation: Secondary | ICD-10-CM | POA: Diagnosis not present

## 2024-09-25 DIAGNOSIS — F419 Anxiety disorder, unspecified: Secondary | ICD-10-CM

## 2024-09-25 DIAGNOSIS — E782 Mixed hyperlipidemia: Secondary | ICD-10-CM | POA: Diagnosis not present

## 2024-09-25 DIAGNOSIS — F32A Depression, unspecified: Secondary | ICD-10-CM

## 2024-09-25 LAB — CBC WITH DIFFERENTIAL/PLATELET
Basophils Absolute: 0.1 K/uL (ref 0.0–0.1)
Basophils Relative: 1.2 % (ref 0.0–3.0)
Eosinophils Absolute: 0.2 K/uL (ref 0.0–0.7)
Eosinophils Relative: 2.7 % (ref 0.0–5.0)
HCT: 40.5 % (ref 36.0–46.0)
Hemoglobin: 13.4 g/dL (ref 12.0–15.0)
Lymphocytes Relative: 24.8 % (ref 12.0–46.0)
Lymphs Abs: 1.6 K/uL (ref 0.7–4.0)
MCHC: 33 g/dL (ref 30.0–36.0)
MCV: 89.7 fl (ref 78.0–100.0)
Monocytes Absolute: 0.5 K/uL (ref 0.1–1.0)
Monocytes Relative: 7.6 % (ref 3.0–12.0)
Neutro Abs: 4.1 K/uL (ref 1.4–7.7)
Neutrophils Relative %: 63.7 % (ref 43.0–77.0)
Platelets: 267 K/uL (ref 150.0–400.0)
RBC: 4.52 Mil/uL (ref 3.87–5.11)
RDW: 14.4 % (ref 11.5–15.5)
WBC: 6.4 K/uL (ref 4.0–10.5)

## 2024-09-25 LAB — COMPREHENSIVE METABOLIC PANEL WITH GFR
ALT: 26 U/L (ref 0–35)
AST: 21 U/L (ref 0–37)
Albumin: 4.2 g/dL (ref 3.5–5.2)
Alkaline Phosphatase: 79 U/L (ref 39–117)
BUN: 15 mg/dL (ref 6–23)
CO2: 27 meq/L (ref 19–32)
Calcium: 9.1 mg/dL (ref 8.4–10.5)
Chloride: 104 meq/L (ref 96–112)
Creatinine, Ser: 0.72 mg/dL (ref 0.40–1.20)
GFR: 91.09 mL/min (ref >60.00)
Glucose, Bld: 98 mg/dL (ref 70–99)
Potassium: 4.2 meq/L (ref 3.5–5.1)
Sodium: 140 meq/L (ref 135–145)
Total Bilirubin: 0.4 mg/dL (ref 0.2–1.2)
Total Protein: 7.5 g/dL (ref 6.0–8.3)

## 2024-09-25 LAB — LIPID PANEL
Cholesterol: 194 mg/dL (ref 0–200)
HDL: 60.1 mg/dL (ref >39.00)
LDL Cholesterol: 109 mg/dL — ABNORMAL HIGH (ref 0–99)
NonHDL: 133.45
Total CHOL/HDL Ratio: 3
Triglycerides: 124 mg/dL (ref 0.0–149.0)
VLDL: 24.8 mg/dL (ref 0.0–40.0)

## 2024-09-25 LAB — HEMOGLOBIN A1C: Hgb A1c MFr Bld: 6.3 % (ref 4.6–6.5)

## 2024-09-25 LAB — VITAMIN D 25 HYDROXY (VIT D DEFICIENCY, FRACTURES): VITD: 51.46 ng/mL (ref 30.00–100.00)

## 2024-09-25 NOTE — Assessment & Plan Note (Signed)
 BMI 41. Discussed nutrition, exercise. Insurance does not cover weight loss medications. Recommend increase fruits and vegetables.

## 2024-09-25 NOTE — Assessment & Plan Note (Signed)
 Health maintenance reviewed and updated. Discussed nutrition, exercise. Follow-up 1 year.

## 2024-09-25 NOTE — Assessment & Plan Note (Signed)
 Check A1c today. Discussed weight management and lifestyle changes as primary interventions.

## 2024-09-25 NOTE — Progress Notes (Signed)
 BP 118/84 (BP Location: Left Arm, Cuff Size: Large)   Pulse 77   Temp (!) 97.3 F (36.3 C)   Ht 5' 2 (1.575 m)   Wt 224 lb 9.6 oz (101.9 kg)   LMP 10/06/2022 (Exact Date) Comment: Last cycle was in 07/2021  SpO2 97%   BMI 41.08 kg/m    Subjective:    Patient ID: Savannah Cook, female    DOB: 1966/06/16, 58 y.o.   MRN: [id number]  CC: Chief Complaint  Patient presents with   Annual Exam    With fasting labs, concerns with weight    HPI: Savannah Cook is a 58 y.o. female presenting on 09/25/2024 for comprehensive medical examination. Current medical complaints include:weight   She is having trouble with weight loss, experiencing cravings and snacking at work. Her cardiologist attempted to get zepbound  covered for OSA, however it was denied.   She currently lives with: significant other Menopausal Symptoms: no  Depression and Anxiety Screen done today and results listed below:     09/25/2024    8:58 AM 09/07/2024    1:21 PM 06/24/2024    9:42 AM 04/02/2024    3:53 PM 02/27/2024    4:45 PM  Depression screen PHQ 2/9  Decreased Interest 1 2 0 0 0  Down, Depressed, Hopeless 0 2 0 0 0  PHQ - 2 Score 1 4 0 0 0  Altered sleeping 0 0  0 0  Tired, decreased energy 2 3  0 0  Change in appetite 3 3  2 1   Feeling bad or failure about yourself  0 0  0 0  Trouble concentrating 0 1  0 0  Moving slowly or fidgety/restless 0 3  0 0  Suicidal thoughts 0 0  0 0  PHQ-9 Score 6 14  2 1   Difficult doing work/chores  Somewhat difficult  Somewhat difficult Not difficult at all      09/25/2024    8:58 AM 09/07/2024    1:22 PM 04/02/2024    3:53 PM 02/27/2024    4:46 PM  GAD 7 : Generalized Anxiety Score  Nervous, Anxious, on Edge 3 3 0 0  Control/stop worrying 2 3 0 0  Worry too much - different things 2 3 1  0  Trouble relaxing 3 2 0 2  Restless 1 0 0 1  Easily annoyed or irritable 2 2 0 0  Afraid - awful might happen 0 2 0 0  Total GAD 7 Score 13 15 1 3   Anxiety Difficulty  Somewhat difficult Very difficult Not difficult at all Not difficult at all    The patient does not have a history of falls. I did not complete a risk assessment for falls. A plan of care for falls was not documented.   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   GERD (gastroesophageal reflux disease) September 2025   Dr Tinnie presribed Omeprozol   Hx of colonic polyp - sessile serrated 10/08/2018   Iron deficiency anemia due to chronic blood loss    colon cancer   Seasonal allergies  Shingles 11/2021   Sleep apnea    Not sure just did the testing waiting for results    Surgical History:  Past Surgical History:  Procedure Laterality Date   CHOLECYSTECTOMY N/A 07/25/2017   Procedure: CHOLECYSTECTOMY;  Surgeon: Lily Boas, MD;  Location: [facility];  Service: General;  Laterality: N/A;   COLON SURGERY  07/25/2017   COLONOSCOPY  06/10/2018   w/Dr.[email or web address]    LAPAROSCOPIC PARTIAL COLECTOMY N/A 07/25/2017   Procedure: LAPAROSCOPIC PARTIAL COLECTOMY;  Surgeon: Lily Boas, MD;  Location: [facility];  Service: General;  Laterality: N/A;   UPPER GASTROINTESTINAL ENDOSCOPY  06/10/2018   WISDOM TOOTH EXTRACTION     WRIST SURGERY Right 04/2012    Medications:  Current Outpatient Medications on File Prior to Visit  Medication Sig   acetaminophen  (TYLENOL ) 500 MG tablet Take 500 mg by mouth every 6 (six) hours as needed for moderate pain, fever, headache or mild pain.   Cholecalciferol (VITAMIN D3 PO) Take 2,000 Units by mouth daily.   CINNAMON PO Take 2,000 mg by mouth daily.   clindamycin  (CLEOCIN  T) 1 % external solution Apply topically 2  (two) times daily. (Patient taking differently: Apply topically 2 (two) times daily. PRN)   Collagen-Vitamin C-Biotin (COLLAGEN PO) Take 1 tablet by mouth every other day.   diltiazem  (CARDIZEM  CD) 120 MG 24 hr capsule Take 1 capsule (120 mg total) by mouth daily.   diltiazem  (CARDIZEM ) 30 MG tablet Take 1 tablet (30 mg total) by mouth 2 (two) times daily for palpitations.   ELDERBERRY PO Take by mouth. Every day  2000 mg   escitalopram  (LEXAPRO ) 10 MG tablet Take 1 tablet (10 mg total) by mouth daily.   fexofenadine  (FT ALLERGY RELIEF 24 HOUR) 180 MG tablet Take 1 tablet (180 mg total) by mouth daily.   ibuprofen  (ADVIL ) 800 MG tablet Take 1 tablet (800 mg total) by mouth 2 (two) times daily as needed for pain.   Multiple Vitamin (MULTIVITAMIN ADULT PO) Take 1 tablet by mouth every morning. One daily- Every Woman's 55 plus- [place]   omeprazole  (PRILOSEC) 40 MG capsule Take 1 capsule (40 mg total) by mouth daily.   OVER THE COUNTER MEDICATION Tylenol  Cold and Sinus as needed   promethazine-dextromethorphan (PROMETHAZINE-DM) 6.25-15 MG/5ML syrup Take 5 mLs by mouth 4 (four) times daily as needed.   rosuvastatin  (CRESTOR ) 5 MG tablet Take 1 tablet (5 mg total) by mouth daily before supper. (Patient not taking: Reported on 09/25/2024)   tirzepatide  (ZEPBOUND ) 2.5 MG/0.5ML Pen Inject 2.5 mg into the skin once a week. (Patient not taking: Reported on 09/25/2024)   No current facility-administered medications on file prior to visit.    Allergies:  Allergies  Allergen Reactions   Ceclor [Cefaclor] Rash    Social History:  Social History   Socioeconomic History   Marital status: Significant Other    Spouse name: Not on file   Number of children: 1   Years of education: Not on file   Highest education level: Bachelor's degree (e.g., BA, AB, BS)  Occupational History   Occupation: Arts development officer, Social worker apartments  Tobacco Use   Smoking status: Former    Current packs/day: 0.00     Types: Cigarettes    Quit date: 12/17/1985    Years since quitting: 38.8   Smokeless tobacco: Never   Tobacco comments:    Former smoker 04/19/22  Vaping Use   Vaping status: Never Used  Substance and Sexual Activity   Alcohol use: Not  Currently   Drug use: No   Sexual activity: Not Currently    Partners: Male    Birth control/protection: Post-menopausal, Other-see comments    Comment: VAS  Other Topics Concern   Not on file  Social History Narrative   The patient is single, used to live w/ a partner until 11-2020   One daughter   is employed as a Arts development officer   2 caffeinated beverages a day   Social Drivers of Corporate investment banker Strain: Low Risk  (09/06/2024)   Overall Financial Resource Strain (CARDIA)    Difficulty of Paying Living Expenses: Not hard at all  Food Insecurity: No Food Insecurity (09/06/2024)   Hunger Vital Sign    Worried About Running Out of Food in the Last Year: Never true    Ran Out of Food in the Last Year: Never true  Transportation Needs: No Transportation Needs (09/06/2024)   PRAPARE - Administrator, Civil Service (Medical): No    Lack of Transportation (Non-Medical): No  Physical Activity: Insufficiently Active (09/06/2024)   Exercise Vital Sign    Days of Exercise per Week: 1 day    Minutes of Exercise per Session: 60 min  Stress: Stress Concern Present (09/06/2024)   [employer] of Occupational Health - Occupational Stress Questionnaire    Feeling of Stress: Rather much  Social Connections: Moderately Integrated (09/06/2024)   Social Connection and Isolation Panel    Frequency of Communication with Friends and Family: More than three times a week    Frequency of Social Gatherings with Friends and Family: Twice a week    Attends Religious Services: More than 4 times per year    Active Member of [employer] or Organizations: No    Attends Engineer, structural: Not on file    Marital Status: Living with partner   Intimate Partner Violence: Not on file   Social History   Tobacco Use  Smoking Status Former   Current packs/day: 0.00   Types: Cigarettes   Quit date: 12/17/1985   Years since quitting: 38.8  Smokeless Tobacco Never  Tobacco Comments   Former smoker 04/19/22   Social History   Substance and Sexual Activity  Alcohol Use Not Currently    Family History:  Family History  Problem Relation Age of Onset   Diabetes Mother    Hypertension Mother    Bipolar disorder Mother    COPD Mother    Anxiety disorder Mother    Depression Mother    Obesity Mother    Diabetes Father    Hypertension Father    Lung cancer Father    Tremor Father        essential   Arthritis Father    Cancer Father    Heart disease Maternal Grandmother    Colon cancer Maternal Grandfather 95   Stroke Paternal Grandmother    Colon polyps Neg Hx    Esophageal cancer Neg Hx    Rectal cancer Neg Hx    Stomach cancer Neg Hx     Past medical history, surgical history, medications, allergies, family history and social history reviewed with patient today and changes made to appropriate areas of the chart.   Review of Systems  Constitutional: Negative.   HENT:  Positive for congestion. Negative for ear pain and sore throat.   Eyes: Negative.   Respiratory: Negative.    Cardiovascular: Negative.   Gastrointestinal: Negative.   Genitourinary: Negative.   Musculoskeletal: Negative.   Skin:  Negative.   Neurological:  Positive for headaches. Negative for dizziness.  Psychiatric/Behavioral: Negative.     All other ROS negative except what is listed above and in the HPI.      Objective:    BP 118/84 (BP Location: Left Arm, Cuff Size: Large)   Pulse 77   Temp (!) 97.3 F (36.3 C)   Ht 5' 2 (1.575 m)   Wt 224 lb 9.6 oz (101.9 kg)   LMP 10/06/2022 (Exact Date) Comment: Last cycle was in 07/2021  SpO2 97%   BMI 41.08 kg/m   Wt Readings from Last 3 Encounters:  09/25/24 224 lb 9.6 oz (101.9 kg)   09/15/24 222 lb (100.7 kg)  09/10/24 221 lb (100.2 kg)    Physical Exam Vitals and nursing note reviewed.  Constitutional:      General: She is not in acute distress.    Appearance: Normal appearance.  HENT:     Head: Normocephalic and atraumatic.     Right Ear: Tympanic membrane, ear canal and external ear normal.     Left Ear: Tympanic membrane, ear canal and external ear normal.     Mouth/Throat:     Mouth: Mucous membranes are moist.     Pharynx: No posterior oropharyngeal erythema.  Eyes:     Conjunctiva/sclera: Conjunctivae normal.  Cardiovascular:     Rate and Rhythm: Normal rate and regular rhythm.     Pulses: Normal pulses.     Heart sounds: Normal heart sounds.  Pulmonary:     Effort: Pulmonary effort is normal.     Breath sounds: Normal breath sounds.  Abdominal:     Palpations: Abdomen is soft.     Tenderness: There is no abdominal tenderness.  Musculoskeletal:        General: Normal range of motion.     Cervical back: Normal range of motion and neck supple.     Right lower leg: No edema.     Left lower leg: No edema.  Lymphadenopathy:     Cervical: No cervical adenopathy.  Skin:    General: Skin is warm and dry.  Neurological:     General: No focal deficit present.     Mental Status: She is alert and oriented to person, place, and time.     Cranial Nerves: No cranial nerve deficit.     Coordination: Coordination normal.     Gait: Gait normal.  Psychiatric:        Mood and Affect: Mood normal.        Behavior: Behavior normal.        Thought Content: Thought content normal.        Judgment: Judgment normal.     Results for orders placed or performed in visit on 09/18/24  [facility] MAMMOGRAPHY   Collection Time: 09/17/24 12:00 AM  Result Value Ref Range   HM Mammogram 0-4 Bi-Rad 0-4 Bi-Rad, Self Reported Normal    Fibrosis 4 Score = .92 (Low risk)         Interpretation for patients with HCV          <1.45       -  F0-F1 (Low risk)          1.45-3.25  -  Indeterminate           >3.25      -  F3-F4 (High risk)     Validated for ages 59-65      Assessment & Plan:   Problem List Items Addressed  This Visit       Cardiovascular and Mediastinum   Paroxysmal atrial fibrillation (HCC)   Chronic, stable.  She is currently following with cardiology.  She currently takes diltiazem  120 mg daily.  She is not taking any blood thinners at this time.  Continue collaboration recommendations from cardiology.         Respiratory   GERD with apnea   Chronic, stable. Continue omeprazole  40mg  daily as needed.       OSA (obstructive sleep apnea)   Diagnosed with moderate sleep apnea. Insurance doesn't cover zepbound . She is working with specialist to get CPAP.         Other   Morbid obesity (HCC)   BMI 41. Discussed nutrition, exercise. Insurance does not cover weight loss medications. Recommend increase fruits and vegetables.       Routine general medical examination at a health care facility - Primary   Health maintenance reviewed and updated. Discussed nutrition, exercise. Follow-up 1 year.        Anxiety and depression   Chronic, ongoing. She is still having stress at work along with anxiety at home at times. She decided against FMLA leave. Continue lexapro  10mg  daily.       Mixed hyperlipidemia   She wanted to wait to start rosuvastatin  until after covid-19 infection. She would also like her lipids checked today before she starts it. Recommend she start rosvuastatin 5mg  daily. Check CMP, CBC, lipid panel today.       Relevant Orders   CBC with Differential/Platelet   Comprehensive metabolic panel with GFR   Lipid panel   Prediabetes   Check A1c today. Discussed weight management and lifestyle changes as primary interventions.      Relevant Orders   Hemoglobin A1c   Vitamin D  deficiency   Check vitamin D  and treat based on results.       Relevant Orders   VITAMIN D  25 Hydroxy (Vit-D Deficiency, Fractures)     Follow up  plan: Return in about 1 year (around 09/25/2025) for CPE.   LABORATORY TESTING:  - Pap smear: up to date  IMMUNIZATIONS:   - Tdap: Tetanus vaccination status reviewed: last tetanus booster within 10 years. - Influenza: Declined - Pneumovax: Not applicable - Prevnar: Declined - HPV: Not applicable - Shingrix vaccine: Declined  SCREENING: -Mammogram: Up to date  - Colonoscopy: Up to date  - Bone Density: Not applicable   PATIENT COUNSELING:   Advised to take 1 mg of folate supplement per day if capable of pregnancy.   Sexuality: Discussed sexually transmitted diseases, partner selection, use of condoms, avoidance of unintended pregnancy  and contraceptive alternatives.   Advised to avoid cigarette smoking.  I discussed with the patient that most people either abstain from alcohol or drink within safe limits (<=14/week and <=4 drinks/occasion for males, <=7/weeks and <= 3 drinks/occasion for females) and that the risk for alcohol disorders and other health effects rises proportionally with the number of drinks per week and how often a drinker exceeds daily limits.  Discussed cessation/primary prevention of drug use and availability of treatment for abuse.   Diet: Encouraged to adjust caloric intake to maintain  or achieve ideal body weight, to reduce intake of dietary saturated fat and total fat, to limit sodium intake by avoiding high sodium foods and not adding table salt, and to maintain adequate dietary potassium and calcium  preferably from fresh fruits, vegetables, and low-fat dairy products.    stressed the importance of regular exercise  Injury prevention: Discussed safety belts, safety helmets, smoke detector, smoking near bedding or upholstery.   Dental health: Discussed importance of regular tooth brushing, flossing, and dental visits.    NEXT PREVENTATIVE PHYSICAL DUE IN 1 YEAR. Return in about 1 year (around 09/25/2025) for CPE.  Marwa Fuhrman A Malory Spurr

## 2024-09-25 NOTE — Assessment & Plan Note (Signed)
 Diagnosed with moderate sleep apnea. Insurance doesn't cover zepbound . She is working with specialist to get CPAP.

## 2024-09-25 NOTE — Assessment & Plan Note (Signed)
 She wanted to wait to start rosuvastatin  until after covid-19 infection. She would also like her lipids checked today before she starts it. Recommend she start rosvuastatin 5mg  daily. Check CMP, CBC, lipid panel today.

## 2024-09-25 NOTE — Assessment & Plan Note (Signed)
 Chronic, stable. Continue omeprazole  40mg  daily as needed.

## 2024-09-25 NOTE — Patient Instructions (Signed)
 It was great to see you!  We are checking your labs today and will let you know the results via mychart/phone.   Continue focusing on eating protein, fruits, and veggies   You can ask about nasal pillow for your CPAP machine   Let's follow-up in 1 year, sooner if you have concerns.  If a referral was placed today, you will be contacted for an appointment. Please note that routine referrals can sometimes take up to 3-4 weeks to process. Please call our office if you haven't heard anything after this time frame.  Take care,  Tinnie Harada, NP

## 2024-09-25 NOTE — Assessment & Plan Note (Signed)
 Chronic, ongoing. She is still having stress at work along with anxiety at home at times. She decided against FMLA leave. Continue lexapro  10mg  daily.

## 2024-09-25 NOTE — Assessment & Plan Note (Signed)
Chronic, stable.  She is currently following with cardiology.  She currently takes diltiazem 120 mg daily.  She is not taking any blood thinners at this time.  Continue collaboration recommendations from cardiology.

## 2024-09-25 NOTE — Assessment & Plan Note (Signed)
Check vitamin D and treat based on results.  ?

## 2024-09-28 ENCOUNTER — Other Ambulatory Visit (HOSPITAL_BASED_OUTPATIENT_CLINIC_OR_DEPARTMENT_OTHER): Payer: Self-pay

## 2024-09-28 MED ORDER — METFORMIN HCL ER 500 MG PO TB24
500.0000 mg | ORAL_TABLET | Freq: Every day | ORAL | 2 refills | Status: DC
  Filled 2024-09-28 – 2024-09-30 (×3): qty 30, 30d supply, fill #0

## 2024-09-30 ENCOUNTER — Other Ambulatory Visit (HOSPITAL_BASED_OUTPATIENT_CLINIC_OR_DEPARTMENT_OTHER): Payer: Self-pay

## 2024-10-01 ENCOUNTER — Other Ambulatory Visit (HOSPITAL_BASED_OUTPATIENT_CLINIC_OR_DEPARTMENT_OTHER): Payer: Self-pay

## 2024-10-05 ENCOUNTER — Encounter: Admitting: Nurse Practitioner

## 2024-10-08 ENCOUNTER — Ambulatory Visit (HOSPITAL_BASED_OUTPATIENT_CLINIC_OR_DEPARTMENT_OTHER): Admitting: Nurse Practitioner

## 2024-10-26 ENCOUNTER — Telehealth: Payer: Self-pay | Admitting: Neurology

## 2024-10-26 NOTE — Telephone Encounter (Signed)
 Called and LVM for pt to call back and schedule her Initial Cpap appt between the days of 11/05/24-01/05/24

## 2024-10-27 NOTE — Telephone Encounter (Signed)
 Called pt and LVM stating that she is needing to schedule her Initial Cpap visit. DME in pt's [employer].

## 2024-10-28 ENCOUNTER — Other Ambulatory Visit (HOSPITAL_BASED_OUTPATIENT_CLINIC_OR_DEPARTMENT_OTHER): Payer: Self-pay

## 2024-10-28 ENCOUNTER — Other Ambulatory Visit: Payer: Self-pay

## 2024-10-28 MED FILL — Escitalopram Oxalate Tab 10 MG (Base Equiv): ORAL | 90 days supply | Qty: 90 | Fill #0 | Status: AC

## 2024-11-10 NOTE — Progress Notes (Unsigned)
 [facility] [address] [place]. [place] [address] [phone]       OFFICE FOLLOW UP NOTE  [name] Date of Birth:  Jan 07, 1966 Medical Record Number:  [id number]    Primary neurologist: Dr. Chalice Reason for visit: Initial CPAP follow-up    SUBJECTIVE:   CHIEF COMPLAINT:  Chief Complaint  Patient presents with   Obstructive Sleep Apnea    RM 3 alone  Pt is well and stable, reports no OSA/CPA concerns.     Follow-up visit:   Brief HPI:   Savannah Cook is a 58 y.o. female who was evaluated by Dr. Chalice in 05/2024 for concern of underlying sleep apnea with complaints of fatigue, sleepiness, snoring, nocturia, morning headaches and weight gain with BMI of 41.  Prior sleep study in 2008 without evidence of apnea.  ESS 10/24.  HST 07/2024 showed moderate to severe sleep apnea with total AHI of 22.6/h and O2 nadir of 91%.  AutoPap therapy initiated 09/06/2024.     Interval history:  Patient is being seen for initial CPAP follow-up visit.  Reports overall doing well with CPAP therapy.  Tolerating well without any issues.  She does feel sleep has improved since using but still has fatigue during the day and wakes up with generalized muscle stiffness and achiness. She does endorse significant stress at work which she feels contributes to her fatigue. Lab work ordered at initial visit with Dr. Chalice due to stiffness complaints and hyperflexia in legs bilaterally but never completed for unclear reason. She is interested in having these completed today. Trying to lose weight, insurance would not cover Zepbound , has been working on diet modification.  ESS 8/24.       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   GERD (gastroesophageal reflux disease) September 2025   Dr Tinnie presribed Omeprozol   Hx of colonic polyp - sessile serrated 10/08/2018   Iron deficiency anemia due to chronic blood loss    colon cancer   Seasonal allergies    Shingles 11/2021   Sleep apnea    Not sure just did the testing waiting for results    PSH:  Past Surgical History:  Procedure Laterality Date   CHOLECYSTECTOMY N/A 07/25/2017   Procedure: CHOLECYSTECTOMY;  Surgeon: Lily Boas, MD;  Location: [facility];  Service: General;  Laterality: N/A;   COLON SURGERY  07/25/2017   COLONOSCOPY  06/10/2018   w/Dr.Gessner    LAPAROSCOPIC PARTIAL COLECTOMY N/A 07/25/2017   Procedure: LAPAROSCOPIC PARTIAL COLECTOMY;  Surgeon: Lily Boas, MD;  Location: [facility];  Service: General;  Laterality: N/A;   UPPER GASTROINTESTINAL ENDOSCOPY  06/10/2018   WISDOM TOOTH EXTRACTION     WRIST SURGERY Right 04/2012    Social History:  Social History   Socioeconomic History   Marital status: Significant Other    Spouse name: Not on file   Number of children: 1   Years of education: Not on file   Highest education level: Bachelor's  degree (e.g., BA, AB, BS)  Occupational History   Occupation: arts development officer, Social Worker apartments  Tobacco Use   Smoking status: Former    Current packs/day: 0.00    Types: Cigarettes    Quit date: 12/17/1985    Years since quitting: 38.9   Smokeless tobacco: Never   Tobacco comments:    Former smoker 04/19/22  Vaping Use   Vaping status: Never Used  Substance and Sexual Activity   Alcohol use: Not Currently   Drug use: No   Sexual activity: Not Currently    Partners: Male    Birth control/protection: Post-menopausal, Other-see comments    Comment: VAS  Other Topics Concern   Not on file   Social History Narrative   The patient is single, used to live w/ a partner until 11-2020   One daughter   is employed as a arts development officer   2 caffeinated beverages a day   Social Drivers of Corporate Investment Banker Strain: Low Risk  (09/06/2024)   Overall Financial Resource Strain (CARDIA)    Difficulty of Paying Living Expenses: Not hard at all  Food Insecurity: No Food Insecurity (09/06/2024)   Hunger Vital Sign    Worried About Running Out of Food in the Last Year: Never true    Ran Out of Food in the Last Year: Never true  Transportation Needs: No Transportation Needs (09/06/2024)   PRAPARE - Administrator, Civil Service (Medical): No    Lack of Transportation (Non-Medical): No  Physical Activity: Insufficiently Active (09/06/2024)   Exercise Vital Sign    Days of Exercise per Week: 1 day    Minutes of Exercise per Session: 60 min  Stress: Stress Concern Present (09/06/2024)   [employer] of Occupational Health - Occupational Stress Questionnaire    Feeling of Stress: Rather much  Social Connections: Moderately Integrated (09/06/2024)   Social Connection and Isolation Panel    Frequency of Communication with Friends and Family: More than three times a week    Frequency of Social Gatherings with Friends and Family: Twice a week    Attends Religious Services: More than 4 times per year    Active Member of [employer] or Organizations: No    Attends Engineer, Structural: Not on file    Marital Status: Living with partner  Intimate Partner Violence: Not on file    Family History:  Family History  Problem Relation Age of Onset   Diabetes Mother    Hypertension Mother    Bipolar disorder Mother    COPD Mother    Anxiety disorder Mother    Depression Mother    Obesity Mother    Diabetes Father    Hypertension Father    Lung cancer Father    Tremor Father        essential   Arthritis Father    Cancer Father    Heart disease Maternal Grandmother     Colon cancer Maternal Grandfather 95   Stroke Paternal Grandmother    Colon polyps Neg Hx    Esophageal cancer Neg Hx    Rectal cancer Neg Hx    Stomach cancer Neg Hx     Medications:   Current Outpatient Medications on File Prior to Visit  Medication Sig Dispense Refill   acetaminophen  (TYLENOL ) 500 MG tablet Take 500 mg by mouth every 6 (six) hours as needed for moderate pain, fever, headache or mild pain.     Cholecalciferol (VITAMIN D3 PO) Take 2,000  Units by mouth daily.     CINNAMON PO Take 2,000 mg by mouth daily.     clindamycin  (CLEOCIN  T) 1 % external solution Apply topically 2 (two) times daily. (Patient taking differently: Apply topically 2 (two) times daily. PRN) 60 mL 0   diltiazem  (CARDIZEM  CD) 120 MG 24 hr capsule Take 1 capsule (120 mg total) by mouth daily. 90 capsule 3   diltiazem  (CARDIZEM ) 30 MG tablet Take 1 tablet (30 mg total) by mouth 2 (two) times daily for palpitations. 60 tablet 6   ELDERBERRY PO Take by mouth. Every day  2000 mg     escitalopram  (LEXAPRO ) 10 MG tablet Take 1 tablet (10 mg total) by mouth daily. 90 tablet 1   fexofenadine  (FT ALLERGY RELIEF 24 HOUR) 180 MG tablet Take 1 tablet (180 mg total) by mouth daily. 90 tablet 1   Multiple Vitamin (MULTIVITAMIN ADULT PO) Take 1 tablet by mouth every morning. One daily- Every Woman's 55 plus- [place] Chapter     omeprazole  (PRILOSEC) 40 MG capsule Take 1 capsule (40 mg total) by mouth daily. 30 capsule 1   OVER THE COUNTER MEDICATION Tylenol  Cold and Sinus as needed     rosuvastatin  (CRESTOR ) 5 MG tablet Take 1 tablet (5 mg total) by mouth daily before supper. 90 tablet 3   No current facility-administered medications on file prior to visit.    Allergies:   Allergies  Allergen Reactions   Ceclor [Cefaclor] Rash      OBJECTIVE:  Physical Exam  Vitals:   11/11/24 1020 11/11/24 1117  BP: (!) 149/85 (!) 140/82  Pulse: 65   Weight: 228 lb (103.4 kg)   Height: 5' 2 (1.575 m)    Body mass  index is 41.7 kg/m. No results found.   General: well developed, well nourished, pleasant middle-age female, seated, in no evident distress Head: head normocephalic and atraumatic.   Neck: supple with no carotid or supraclavicular bruits Cardiovascular: regular rate and rhythm, no murmurs  Neurologic Exam Mental Status: Awake and fully alert. Oriented to place and time. Recent and remote memory intact. Attention span, concentration and fund of knowledge appropriate. Mood and affect appropriate.  Cranial Nerves: Pupils equal, briskly reactive to light. Extraocular movements full without nystagmus. Visual fields full to confrontation. Hearing intact. Facial sensation intact. Face, tongue, palate moves normally and symmetrically.  Motor: Normal bulk and tone. Normal strength in all tested extremity muscles Gait and Station: Arises from chair without difficulty. Stance is normal. Gait demonstrates normal stride length and balance without use of AD.         ASSESSMENT/PLAN: Savannah Cook is a 59 y.o. year old female    OSA on CPAP :  Compliance report shows satisfactory usage with optimal residual AHI.   Continue current pressure settings of 6-16 with EPR 2 Discussed continued nightly usage with ensuring greater than 4 hours nightly for optimal benefit and per insurance purposes.   Continue to follow with DME company [facility] for any needed supplies or CPAP related concerns CPAP set up 12/2023  Generalized muscle stiffness Hyperflexia in BLE Will place new orders to complete labs as previously advised by Dr. Chalice Discussed trial of magnesium at night     Follow up in 6 months or call earlier if needed   CC:  PCP: Nedra Tinnie LABOR, NP    I personally spent a total of 30 minutes in the care of the patient today including preparing to see the patient, performing a  medically appropriate exam/evaluation, counseling and educating, placing orders, and documenting clinical  information in the EHR.  Harlene Bogaert, AGNP-BC  [facility] [address] [place], [place] [address]  Phone [phone] Fax [phone] Note: This document was prepared with digital dictation and possible smart phrase technology. Any transcriptional errors that result from this process are unintentional.

## 2024-11-11 ENCOUNTER — Encounter: Payer: Self-pay | Admitting: Adult Health

## 2024-11-11 ENCOUNTER — Ambulatory Visit: Admitting: Adult Health

## 2024-11-11 VITALS — BP 140/82 | HR 65 | Ht 62.0 in | Wt 228.0 lb

## 2024-11-11 DIAGNOSIS — Z8261 Family history of arthritis: Secondary | ICD-10-CM

## 2024-11-11 DIAGNOSIS — R292 Abnormal reflex: Secondary | ICD-10-CM

## 2024-11-11 DIAGNOSIS — M79609 Pain in unspecified limb: Secondary | ICD-10-CM

## 2024-11-11 DIAGNOSIS — G4733 Obstructive sleep apnea (adult) (pediatric): Secondary | ICD-10-CM | POA: Diagnosis not present

## 2024-11-11 DIAGNOSIS — R251 Tremor, unspecified: Secondary | ICD-10-CM

## 2024-11-11 NOTE — Patient Instructions (Addendum)
 Your Plan:  Continue nightly use of CPAP for adequate sleep apnea management   Continue to follow with [facility] for CPAP related concerns and supplies   Can try magnesium glycinate at night to help with sleep, muscle aches, and anxiety. CALM is a good magnesium supplement that you can take a night before bed              Thank you for coming to see us  at [facility]. I hope we have been able to provide you high quality care today.  You may receive a patient satisfaction survey over the next few weeks. We would appreciate your feedback and comments so that we may continue to improve ourselves and the health of our patients.

## 2024-11-13 LAB — SEDIMENTATION RATE: Sed Rate: 47 mm/h — ABNORMAL HIGH (ref 0–40)

## 2024-11-13 LAB — IRON,TIBC AND FERRITIN PANEL
Ferritin: 87 ng/mL (ref 15–150)
Iron Saturation: 18 % (ref 15–55)
Iron: 68 ug/dL (ref 27–159)
Total Iron Binding Capacity: 375 ug/dL (ref 250–450)
UIBC: 307 ug/dL (ref 131–425)

## 2024-11-13 LAB — ANA W/REFLEX: Anti Nuclear Antibody (ANA): NEGATIVE

## 2024-11-13 LAB — VITAMIN B12: Vitamin B-12: 503 pg/mL (ref 232–1245)

## 2024-11-13 LAB — C-REACTIVE PROTEIN: CRP: 11 mg/L — ABNORMAL HIGH (ref 0–10)

## 2024-11-16 ENCOUNTER — Ambulatory Visit: Payer: Self-pay | Admitting: Adult Health

## 2024-11-16 DIAGNOSIS — M2560 Stiffness of unspecified joint, not elsewhere classified: Secondary | ICD-10-CM

## 2024-11-16 DIAGNOSIS — R7982 Elevated C-reactive protein (CRP): Secondary | ICD-10-CM

## 2024-11-19 ENCOUNTER — Encounter: Payer: Self-pay | Admitting: Rheumatology

## 2024-11-19 ENCOUNTER — Ambulatory Visit (INDEPENDENT_AMBULATORY_CARE_PROVIDER_SITE_OTHER)

## 2024-11-19 ENCOUNTER — Ambulatory Visit

## 2024-11-19 ENCOUNTER — Ambulatory Visit: Attending: Rheumatology | Admitting: Rheumatology

## 2024-11-19 VITALS — BP 138/82 | HR 63 | Temp 97.4°F | Resp 16 | Ht 61.5 in | Wt 227.6 lb

## 2024-11-19 DIAGNOSIS — M25561 Pain in right knee: Secondary | ICD-10-CM | POA: Diagnosis not present

## 2024-11-19 DIAGNOSIS — M25562 Pain in left knee: Secondary | ICD-10-CM | POA: Diagnosis not present

## 2024-11-19 DIAGNOSIS — M79641 Pain in right hand: Secondary | ICD-10-CM

## 2024-11-19 DIAGNOSIS — M2550 Pain in unspecified joint: Secondary | ICD-10-CM | POA: Diagnosis not present

## 2024-11-19 DIAGNOSIS — M542 Cervicalgia: Secondary | ICD-10-CM

## 2024-11-19 DIAGNOSIS — Z8261 Family history of arthritis: Secondary | ICD-10-CM

## 2024-11-19 DIAGNOSIS — M79642 Pain in left hand: Secondary | ICD-10-CM | POA: Diagnosis not present

## 2024-11-19 DIAGNOSIS — F32A Depression, unspecified: Secondary | ICD-10-CM

## 2024-11-19 DIAGNOSIS — M7630 Iliotibial band syndrome, unspecified leg: Secondary | ICD-10-CM | POA: Diagnosis not present

## 2024-11-19 DIAGNOSIS — E559 Vitamin D deficiency, unspecified: Secondary | ICD-10-CM

## 2024-11-19 DIAGNOSIS — R5383 Other fatigue: Secondary | ICD-10-CM

## 2024-11-19 DIAGNOSIS — L659 Nonscarring hair loss, unspecified: Secondary | ICD-10-CM

## 2024-11-19 DIAGNOSIS — D508 Other iron deficiency anemias: Secondary | ICD-10-CM

## 2024-11-19 DIAGNOSIS — G8929 Other chronic pain: Secondary | ICD-10-CM | POA: Diagnosis not present

## 2024-11-19 DIAGNOSIS — I480 Paroxysmal atrial fibrillation: Secondary | ICD-10-CM

## 2024-11-19 DIAGNOSIS — G4733 Obstructive sleep apnea (adult) (pediatric): Secondary | ICD-10-CM

## 2024-11-19 DIAGNOSIS — E782 Mixed hyperlipidemia: Secondary | ICD-10-CM

## 2024-11-19 DIAGNOSIS — M791 Myalgia, unspecified site: Secondary | ICD-10-CM

## 2024-11-19 DIAGNOSIS — R7982 Elevated C-reactive protein (CRP): Secondary | ICD-10-CM

## 2024-11-19 DIAGNOSIS — L719 Rosacea, unspecified: Secondary | ICD-10-CM

## 2024-11-19 DIAGNOSIS — R7303 Prediabetes: Secondary | ICD-10-CM

## 2024-11-19 DIAGNOSIS — K219 Gastro-esophageal reflux disease without esophagitis: Secondary | ICD-10-CM

## 2024-11-19 DIAGNOSIS — F419 Anxiety disorder, unspecified: Secondary | ICD-10-CM

## 2024-11-19 DIAGNOSIS — J302 Other seasonal allergic rhinitis: Secondary | ICD-10-CM

## 2024-11-19 NOTE — Progress Notes (Signed)
 Office Visit Note  Patient: Savannah Cook             Date of Birth: 1966-09-22           MRN: [id number]             PCP: Nedra Tinnie LABOR, NP Referring: Whitfield Raisin, NP Visit Date: 11/19/2024 Occupation: Data Unavailable  Subjective:  Pain in joints and muscles  History of Present Illness: Savannah Cook is a 57 y.o. female seen for the evaluation of polyarthralgia, myalgia.  According the patient her symptoms started couple of years ago with pain in her joints and muscles.  She describes discomfort in her neck over the trapezius region which she relates to stress at work.  She also has some discomfort in her hands, trochanteric region, knee joints and her feet.  She has not noticed any visible swelling.  She states her right middle finger triggers for which she had cortisone injections x 2 at [facility] by Dr. Dene.  The triggering of the finger has improved.  She noticed some pedal edema towards the end of the day.  She gives history of dry eyes and hair loss.  There is no history of oral ulcers, nasal ulcers, malar rash, photosensitivity, Raynaud's or lymphadenopathy. She is right-handed, works as an designer, multimedia.  She makes neck pillows as an hobby.  She enjoys water aerobics and swimming but does not get much time to do it.  She is single, gravida 1, para 1.  There is no history of preeclampsia or DVTs.  She drinks alcohol only occasionally.  She smoked in high school occasionally for only 3 years. Her father has rheumatoid arthritis.    Activities of Daily Living:  Patient reports morning stiffness for 1 hour.   Patient Denies nocturnal pain.  Difficulty dressing/grooming: Reports Difficulty climbing stairs: Reports Difficulty getting out of chair: Denies Difficulty using hands for taps, buttons, cutlery, and/or writing: Denies  Review of Systems  Constitutional:  Negative for fatigue.  HENT:  Negative for mouth sores and mouth  dryness.   Eyes:  Positive for dryness.  Respiratory:  Negative for shortness of breath.   Cardiovascular:  Positive for swelling in legs/feet. Negative for chest pain and palpitations.  Gastrointestinal:  Negative for blood in stool, constipation and diarrhea.  Endocrine: Positive for increased urination.  Genitourinary:  Negative for involuntary urination.  Musculoskeletal:  Positive for joint pain, joint pain, myalgias, morning stiffness, muscle tenderness and myalgias. Negative for gait problem, joint swelling and muscle weakness.  Skin:  Positive for hair loss. Negative for color change, rash and sensitivity to sunlight.  Allergic/Immunologic: Negative for susceptible to infections.  Neurological:  Positive for headaches. Negative for dizziness.  Hematological:  Negative for swollen glands.  Psychiatric/Behavioral:  Negative for depressed mood and sleep disturbance. The patient is not nervous/anxious.     PMFS History:  Patient Active Problem List   Diagnosis Date Noted   OSA (obstructive sleep apnea) 09/25/2024   COVID-19 09/16/2024   Attention deficit hyperactivity disorder (ADHD) 09/07/2024   Chest pain 09/07/2024   Abnormal uterine bleeding 08/10/2024   Abnormal CT scan 08/10/2024   Morning headache, controlled 06/01/2024   Pain in soft tissues of limb 06/01/2024   Hyperreflexia of lower extremity 06/01/2024   Snoring 06/01/2024   GERD with apnea 06/01/2024   Family history of rheumatoid arthritis 06/01/2024   Vitamin D  deficiency 04/02/2024   Seasonal allergies 02/27/2024   Tremor 10/24/2023  History of colon cancer 09/25/2023   Prediabetes 09/25/2023   Mixed hyperlipidemia 05/17/2023   Paroxysmal atrial fibrillation (HCC) 08/08/2022   Paroxysmal SVT (supraventricular tachycardia) 08/08/2022   Rosacea 01/31/2022   Atrial flutter (HCC) 01/31/2022   Routine general medical examination at a health care facility 01/15/2021   Anxiety and depression 01/15/2021   Hx of  colonic polyp - sessile serrated 10/08/2018   Hair loss 01/18/2017   Iron deficiency anemia 01/18/2017   Morbid obesity (HCC) 05/14/2013    Past Medical History:  Diagnosis Date   Allergy 1980   Anxiety    hx of, no medications now   Atrial fibrillation (HCC)    Atrial flutter (HCC)    Colon cancer (HCC) 06/10/2017   Depression 08/17/2024   My company sold after working there for over 23 years stayed with new company its been extremely sressful and discouraging due to lack of stadd and training.   GERD (gastroesophageal reflux disease) September 2025   Dr Tinnie presribed Omeprozol   Hx of colonic polyp - sessile serrated 10/08/2018   Iron deficiency anemia due to chronic blood loss    colon cancer   Seasonal allergies    Shingles 11/2021   Sleep apnea    Not sure just did the testing waiting for results    Family History  Problem Relation Age of Onset   Diabetes Mother    Hypertension Mother    Bipolar disorder Mother    COPD Mother    Anxiety disorder Mother    Depression Mother    Obesity Mother    Diabetes Father    Hypertension Father    Lung cancer Father    Tremor Father        essential   Cancer Father    Rheum arthritis Father    Diabetes Sister    Diabetes Brother    Heart disease Maternal Grandmother    Colon cancer Maternal Grandfather 95   Stroke Paternal Grandmother    Healthy Daughter    Colon polyps Neg Hx    Esophageal cancer Neg Hx    Rectal cancer Neg Hx    Stomach cancer Neg Hx    Past Surgical History:  Procedure Laterality Date   CHOLECYSTECTOMY N/A 07/25/2017   Procedure: CHOLECYSTECTOMY;  Surgeon: Lily Boas, MD;  Location: [facility];  Service: General;  Laterality: N/A;   COLON SURGERY  07/25/2017   COLONOSCOPY  06/10/2018   w/Dr.Gessner    LAPAROSCOPIC PARTIAL COLECTOMY N/A 07/25/2017   Procedure: LAPAROSCOPIC PARTIAL COLECTOMY;  Surgeon: Lily Boas, MD;  Location: [facility];  Service: General;  Laterality: N/A;   UPPER  GASTROINTESTINAL ENDOSCOPY  06/10/2018   WISDOM TOOTH EXTRACTION     WRIST SURGERY Right 04/2012   Social History   Tobacco Use   Smoking status: Former    Current packs/day: 0.00    Types: Cigarettes    Quit date: 12/17/1985    Years since quitting: 38.9    Passive exposure: Past   Smokeless tobacco: Never   Tobacco comments:    Former smoker 04/19/22  Vaping Use   Vaping status: Never Used  Substance Use Topics   Alcohol use: Yes    Comment: occ   Drug use: No   Social History   Social History Narrative   The patient is single, used to live w/ a partner until 11-2020   One daughter   is employed as a arts development officer   2 caffeinated beverages a day  Immunization History  Administered Date(s) Administered   Influenza-Unspecified 11/05/2019   PFIZER(Purple Top)SARS-COV-2 Vaccination 03/14/2020, 04/06/2020, 12/24/2020   Tdap 04/02/2024     Objective: Vital Signs: BP 138/82 (BP Location: Right Arm, Patient Position: Sitting, Cuff Size: Large)   Pulse 63   Temp (!) 97.4 F (36.3 C)   Resp 16   Ht 5' 1.5 (1.562 m)   Wt 227 lb 9.6 oz (103.2 kg)   LMP 10/06/2022 (Exact Date) Comment: Last cycle was in 07/2021  BMI 42.31 kg/m    Physical Exam Vitals and nursing note reviewed.  Constitutional:      Appearance: She is well-developed.  HENT:     Head: Normocephalic and atraumatic.  Eyes:     Conjunctiva/sclera: Conjunctivae normal.  Cardiovascular:     Rate and Rhythm: Normal rate and regular rhythm.     Heart sounds: Normal heart sounds.  Pulmonary:     Effort: Pulmonary effort is normal.     Breath sounds: Normal breath sounds.  Abdominal:     General: Bowel sounds are normal.     Palpations: Abdomen is soft.  Musculoskeletal:     Cervical back: Normal range of motion.  Lymphadenopathy:     Cervical: No cervical adenopathy.  Skin:    General: Skin is warm and dry.     Capillary Refill: Capillary refill takes less than 2 seconds.  Neurological:      Mental Status: She is alert and oriented to person, place, and time.  Psychiatric:        Behavior: Behavior normal.      Musculoskeletal Exam: She had discomfort with range of motion of the cervical spine.  She had bilateral trapezius spasm.  Thoracic and lumbar spine Juengel range of motion.  She had no SI joint tenderness.  Shoulders, elbows, wrist joints, MCPs PIPs and DIPs were in good range of motion.  She tenderness over right CMC joint.  Hip joints and knee joints in good range of motion.  She discomfort range of motion of her knee joints.  No warmth swelling or effusion was noted.  Some tenderness was noted over her IT band.  Knee joints with good range of motion.  No warmth swelling or effusion was noted.  There was no tenderness over ankles or MTPs.  She had no muscular weakness or tenderness was noted.  CDAI Exam: CDAI Score: -- Patient Global: --; Provider Global: -- Swollen: --; Tender: -- Joint Exam 11/19/2024   No joint exam has been documented for this visit   There is currently no information documented on the homunculus. Go to the Rheumatology activity and complete the homunculus joint exam.  Investigation: No additional findings.  Imaging: No results found.  Recent Labs: Lab Results  Component Value Date   WBC 6.4 09/25/2024   HGB 13.4 09/25/2024   PLT 267.0 09/25/2024   NA 140 09/25/2024   K 4.2 09/25/2024   CL 104 09/25/2024   CO2 27 09/25/2024   GLUCOSE 98 09/25/2024   BUN 15 09/25/2024   CREATININE 0.72 09/25/2024   BILITOT 0.4 09/25/2024   ALKPHOS 79 09/25/2024   AST 21 09/25/2024   ALT 26 09/25/2024   PROT 7.5 09/25/2024   ALBUMIN 4.2 09/25/2024   CALCIUM  9.1 09/25/2024   GFRAA >60 07/26/2017   September 25, 2024 LDL 109, vitamin D  51.46, hemoglobin A1c 6.3 November 11, 2024 iron studies normal, CRP 11, sed rate 47, ANA negative, B12 503  Speciality Comments: No specialty comments available.  Procedures:  No procedures performed Allergies:  Ceclor [cefaclor]   Assessment / Plan:     Visit Diagnoses: Polyarthralgia -patient complains of pain and discomfort in multiple joints and muscles.  She complains of pain and stiffness in her cervical spine.  She had bilateral trapezius spasm.  She complains of stiffness and pain in her hands and her knee joints.  She notices intermittent swelling in her hands and knees.  No synovitis was noted on the examination.  Plan: Sedimentation rate, Uric acid, Rheumatoid factor, Cyclic citrul peptide antibody, IgG  Pain in both hands -she complains of a stiffness in her bilateral hands.  She had tenderness over her right CMC joint.  Plan: XR Hand 2 View Right, XR Hand 2 View Left x-rays were suggestive of osteoarthritis.  Hardware was noted in the right radius.  Iliotibial band syndrome, unspecified laterality-she had tenderness over bilateral IT band.  No muscular weakness or tenderness was noted.  A handout on IT band stretches was given.  Chronic pain of both knees -she complains of discomfort in her knee joints.  She has difficulty walking due to knee joint pain.  She difficulty squatting.  Plan: XR KNEE 3 VIEW RIGHT, XR KNEE 3 VIEW LEFT.  80 send mild chondromalacia patella.  Left knee joint x-rays were unremarkable.  A handout on knee joint exercise was given.  Neck pain -she complains of neck stiffness.  She had bilateral trapezius spasm.  Plan: XR Cervical Spine 2 or 3 views.  Multilevel severe spondylosis with significant disc space narrowing was noted.  Facet joint arthropathy was noted.  Anterior osteophytes are noted.  Myalgia -she complains of pain in all of her muscles.  She had no muscular weakness or tenderness.  She had no difficulty getting up from the squatting position except for knee joint discomfort.  Plan: CK  Other fatigue -she been experiencing increased fatigue.  Plan: TSH, Serum protein electrophoresis with reflex  Elevated C-reactive protein (CRP)-CRP and sed rate was elevated.   Will repeat sed rate.  Hair loss-complains of increased hair loss.  She has been taking Biotene.  Other medical problems are listed as follows:  Paroxysmal atrial fibrillation (HCC)  Prediabetes  Gastroesophageal reflux disease without esophagitis  Mixed hyperlipidemia  Rosacea  Other iron deficiency anemia  Vitamin D  deficiency -she has history of vitamin D  deficiency in the past.  She has not been taking vitamin D .  Plan: VITAMIN D  25 Hydroxy (Vit-D Deficiency, Fractures)  Seasonal allergies  Anxiety and depression  OSA (obstructive sleep apnea)  Family history of rheumatoid arthritis-father  Orders: Orders Placed This Encounter  Procedures   XR Hand 2 View Right   XR Hand 2 View Left   XR Cervical Spine 2 or 3 views   XR KNEE 3 VIEW RIGHT   XR KNEE 3 VIEW LEFT   Sedimentation rate   CK   TSH   Uric acid   Rheumatoid factor   Cyclic citrul peptide antibody, IgG   Serum protein electrophoresis with reflex   VITAMIN D  25 Hydroxy (Vit-D Deficiency, Fractures)   No orders of the defined types were placed in this encounter.    Follow-Up Instructions: Return for Myalgia, arthralgia, elevated CRP.   Maya Nash, MD  Note - This record has been created using Animal nutritionist.  Chart creation errors have been sought, but may not always  have been located. Such creation errors do not reflect on  the standard of medical care.

## 2024-11-19 NOTE — Patient Instructions (Signed)
 Iliotibial Band Syndrome Rehab Ask your health care provider which exercises are safe for you. Do exercises exactly as told by your provider and adjust them as told. It's normal to feel mild stretching, pulling, tightness, or discomfort as you do these exercises. Stop right away if you feel sudden pain or your pain gets a lot worse. Do not begin these exercises until told by your provider. Stretching and range-of-motion exercises These exercises warm up your muscles and joints. They also improve the movement and flexibility of your hip and pelvis. Quadriceps stretch, prone  Lie face down (prone) on a firm surface like a bed or padded floor. Bend your left / right knee. Reach back to hold your ankle or pant leg. If you can't reach your ankle or pant leg, use a belt looped around your foot and grab the belt instead. Gently pull your heel toward your butt. Your knee should not slide out to the side. You should feel a stretch in the front of your thigh and knee, also called the quadriceps. Hold this position for __________ seconds. Repeat __________ times. Complete this exercise __________ times a day. Iliotibial band stretch The iliotibial band is a strip of tissue that runs along the outside of your hip down to your knee. Lie on your side with your left / right leg on top. Bend both knees and grab your left / right ankle. Stretch out your bottom arm to help you balance. Slowly bring your top knee back so your thigh goes behind your back. Slowly lower your top leg toward the floor until you feel a gentle stretch on the outside of your left / right hip and thigh. If you don't feel a stretch and your knee won't go farther, place the heel of your other foot on top of your knee and pull your knee down toward the floor with your foot. Hold this position for __________ seconds. Repeat __________ times. Complete this exercise __________ times a day. Strengthening exercises These exercises build strength  and endurance in your hip and pelvis. Endurance means your muscles can keep working even when they're tired. Straight leg raises, side-lying This exercise strengthens the muscles that rotate the leg at the hip and move it away from your body. These muscles are called hip abductors. Lie on your side with your left / right leg on top. Lie so your head, shoulder, hip, and knee line up. You can bend your bottom knee to help you balance. Roll your hips slightly forward so they're stacked directly over each other. Your left / right knee should face forward. Tense the muscles in your outer thigh and hip. Lift your top leg 4-6 inches (10-15 cm) off the ground. Hold this position for __________ seconds. Slowly lower your leg back down to the starting position. Let your muscles fully relax before doing this exercise again. Repeat __________ times. Complete this exercise __________ times a day. Leg raises, prone This exercise strengthens the muscles that move the hips backward. These muscles are called hip extensors. Lie face down (prone) on your bed or a firm surface. You can put a pillow under your hips for comfort and to support your lower back. Bend your left / right knee so your foot points straight up toward the ceiling. Keep the other leg straight and behind you. Squeeze your butt muscles. Lift your left / right thigh off the firm surface. Do not let your back arch. Tense your thigh muscle as hard as you can without having  more knee pain. Hold this position for __________ seconds. Slowly lower your leg to the starting position. Allow your leg to relax all the way. Repeat __________ times. Complete this exercise __________ times a day. Hip hike  Stand sideways on a bottom step. Place your feet so that your left / right leg is on the step, and the other foot is hanging off the side. If you need support for balance, hold onto a railing or wall. Keep your knees straight and your abdomen square,  meaning your hips are level. Then, lift your left / right hip up toward the ceiling. Slowly let your leg that's hanging off the step lower towards the floor. Your foot should get closer to the ground. Do not lean or bend your knees during this movement. Repeat __________ times. Complete this exercise __________ times a day. This information is not intended to replace advice given to you by your health care provider. Make sure you discuss any questions you have with your health care provider. Document Revised: 02/15/2023 Document Reviewed: 02/15/2023 [employer] Patient Education  2024 [employer]Exercises for Chronic Knee Pain Chronic knee pain is pain that lasts longer than 3 months. For most people with chronic knee pain, exercise and weight loss is an important part of treatment. Your health care provider may want you to focus on: Making the muscles that support your knee stronger. This can take pressure off your knee and reduce pain. Preventing knee stiffness. How far you can move your knee, keeping it there or making it farther. Losing weight (if this applies) to take pressure off your knee, lower your risk for injury, and make it easier for you to exercise. Your provider will help you make an exercise program that fits your needs and physical abilities. Below are simple, low-impact exercises you can do at home. Ask your provider or physical therapist how often you should do your exercise program and how many times to repeat each exercise. General safety tips  Get your provider's approval before doing any exercises. Start slowly and stop any time you feel pain. Do not exercise if your knee pain is flaring up. Warm up first. Stretching a cold muscle can cause an injury. Do 5-10 minutes of easy movement or light stretching before beginning your exercises. Do 5-10 minutes of low-impact activity (like walking or cycling) before starting strengthening exercises. Contact your provider any time you  have pain during or after exercising. Exercise can cause discomfort but should not be painful. It is normal to be a little stiff or sore after exercising. Stretching and range-of-motion exercises Front thigh stretch  Stand up straight and support your body by holding on to a chair or resting one hand on a wall. With your legs straight and close together, bend one knee to lift your heel up toward your butt. Using one hand for support, grab your ankle with your free hand. Pull your foot up closer toward your butt to feel the stretch in front of your thigh. Hold the stretch for 30 seconds. Repeat __________ times. Complete this exercise __________ times a day. Back thigh stretch  Sit on the floor with your back straight and your legs out straight in front of you. Place the palms of your hands on the floor and slide them toward your feet as you bend at the hip. Try to touch your nose to your knees and feel the stretch in the back of your thighs. Hold for 30 seconds. Repeat __________ times. Complete this exercise  __________ times a day. Calf stretch  Stand facing a wall. Place the palms of your hands flat against the wall, arms extended, and lean slightly against the wall. Get into a lunge position with one leg bent at the knee and the other leg stretched out straight behind you. Keep both feet facing the wall and increase the bend in your knee while keeping the heel of the other leg flat on the ground. You should feel the stretch in your calf. Hold for 30 seconds. Repeat __________ times. Complete this exercise __________ times a day. Strengthening exercises Straight leg lift  Lie on your back with one knee bent and the other leg out straight. Slowly lift the straight leg without bending the knee. Lift until your foot is about 12 inches (30 cm) off the floor. Hold for 3-5 seconds and slowly lower your leg. Repeat __________ times. Complete this exercise __________ times a day. Single  leg dip  Stand between two chairs and put both hands on the backs of the chairs for support. Extend one leg out straight with your body weight resting on the heel of the standing leg. Slowly bend your standing knee to dip your body to the level that is comfortable for you. Hold for 3-5 seconds. Repeat __________ times. Complete this exercise __________ times a day. Hamstring curls  Stand straight, knees close together, facing the back of a chair. Hold on to the back of a chair with both hands. Keep one leg straight. Bend the other knee while bringing the heel up toward the butt until the knee is bent at a 90-degree angle (right angle). Hold for 3-5 seconds. Repeat __________ times. Complete this exercise __________ times a day. Wall squat  Stand straight with your back, hips, and head against a wall. Step forward one foot at a time with your back still against the wall. Your feet should be 2 feet (61 cm) from the wall at shoulder width. Keeping your back, hips, and head against the wall, slide down the wall to as close to a sitting position as you can get. Hold for 5-10 seconds, then slowly slide back up. Repeat __________ times. Complete this exercise __________ times a day. Step-ups  Stand in front of a sturdy platform or stool that is about 6 inches (15 cm) high. Slowly step up with your left / right foot, keeping your knee in line with your hip and foot. Do not let your knee bend so far that you cannot see your toes. Hold on to a chair for balance, but do not use it for support. Slowly unlock your knee and lower yourself to the starting position. Repeat __________ times. Complete this exercise __________ times a day. Contact a health care provider if: Your exercises cause pain. Your pain is worse after you exercise. Your pain prevents you from doing your exercises. This information is not intended to replace advice given to you by your health care provider. Make sure you discuss  any questions you have with your health care provider. Document Revised: 12/18/2022 Document Reviewed: 12/18/2022 Else[employer] Patient Education  2024 [employer]Cervical Strain and Sprain Rehab Ask your health care provider which exercises are safe for you. Do exercises exactly as told by your health care provider and adjust them as directed. It is normal to feel mild stretching, pulling, tightness, or discomfort as you do these exercises. Stop right away if you feel sudden pain or your pain gets worse. Do not begin these exercises until told by  your health care provider. Stretching and range-of-motion exercises Cervical side bending  Using good posture, sit on a stable chair or stand up. Without moving your shoulders, slowly tilt your left / right ear to your shoulder until you feel a stretch in the neck muscles on the opposite side. You should be looking straight ahead. Hold for __________ seconds. Repeat with the other side of your neck. Repeat __________ times. Complete this exercise __________ times a day. Cervical rotation  Using good posture, sit on a stable chair or stand up. Slowly turn your head to the side as if you are looking over your left / right shoulder. Keep your eyes level with the ground. Stop when you feel a stretch along the side and the back of your neck. Hold for __________ seconds. Repeat this by turning to your other side. Repeat __________ times. Complete this exercise __________ times a day. Thoracic extension and pectoral stretch  Roll a towel or a small blanket so it is about 4 inches (10 cm) in diameter. Lie down on your back on a firm surface. Put the towel in the middle of your back across your spine. It should not be under your shoulder blades. Put your hands behind your head and let your elbows fall out to your sides. Hold for __________ seconds. Repeat __________ times. Complete this exercise __________ times a day. Strengthening exercises Upper  cervical flexion  Lie on your back with a thin pillow behind your head or a small, rolled-up towel under your neck. Gently tuck your chin toward your chest and nod your head down to look toward your feet. Do not lift your head off the pillow. Hold for __________ seconds. Release the tension slowly. Relax your neck muscles completely before you repeat this exercise. Repeat __________ times. Complete this exercise __________ times a day. Cervical extension  Stand about 6 inches (15 cm) away from a wall, with your back facing the wall. Place a soft object, about 6-8 inches (15-20 cm) in diameter, between the back of your head and the wall. A soft object could be a small pillow, a ball, or a folded towel. Gently tilt your head back and press into the soft object. Keep your jaw and forehead relaxed. Hold for __________ seconds. Release the tension slowly. Relax your neck muscles completely before you repeat this exercise. Repeat __________ times. Complete this exercise __________ times a day. Posture and body mechanics Body mechanics refer to the movements and positions of your body while you do your daily activities. Posture is part of body mechanics. Good posture and healthy body mechanics can help to relieve stress in your body's tissues and joints. Good posture means that your spine is in its natural S-curve position (your spine is neutral), your shoulders are pulled back slightly, and your head is not tipped forward. The following are general guidelines for using improved posture and body mechanics in your everyday activities. Sitting  When sitting, keep your spine neutral and keep your feet flat on the floor. Use a footrest, if needed, and keep your thighs parallel to the floor. Avoid rounding your shoulders. Avoid tilting your head forward. When working at a desk or a computer, keep your desk at a height where your hands are slightly lower than your elbows. Slide your chair under your desk so  you are close enough to maintain good posture. When working at a computer, place your monitor at a height where you are looking straight ahead and you do not have  to tilt your head forward or downward to look at the screen. Standing  When standing, keep your spine neutral and keep your feet about hip-width apart. Keep a slight bend in your knees. Your ears, shoulders, and hips should line up. When you do a task in which you stand in one place for a long time, place one foot up on a stable object that is 2-4 inches (5-10 cm) high, such as a footstool. This helps keep your spine neutral. Resting When lying down and resting, avoid positions that are most painful for you. Try to support your neck in a neutral position. You can use a contour pillow or a small rolled-up towel. Your pillow should support your neck but not push on it. This information is not intended to replace advice given to you by your health care provider. Make sure you discuss any questions you have with your health care provider. Document Revised: 04/08/2023 Document Reviewed: 06/25/2022 Else[employer] Patient Education  2024 [employer].

## 2024-11-22 ENCOUNTER — Ambulatory Visit: Payer: Self-pay | Admitting: Rheumatology

## 2024-11-22 NOTE — Progress Notes (Signed)
 Sed rate normal, TSH normal, uric acid normal, rheumatoid factor negative, SPEP normal, vitamin D  normal, anti-CCP pending.  I will discuss results at the follow-up visit.

## 2024-11-23 LAB — PROTEIN ELECTROPHORESIS, SERUM, WITH REFLEX
Albumin ELP: 4.1 g/dL (ref 3.8–4.8)
Alpha 1: 0.3 g/dL (ref 0.2–0.3)
Alpha 2: 0.8 g/dL (ref 0.5–0.9)
Beta 2: 0.5 g/dL (ref 0.2–0.5)
Beta Globulin: 0.5 g/dL (ref 0.4–0.6)
Gamma Globulin: 1.3 g/dL (ref 0.8–1.7)
Total Protein: 7.5 g/dL (ref 6.1–8.1)

## 2024-11-23 LAB — TSH: TSH: 2.36 m[IU]/L (ref 0.40–4.50)

## 2024-11-23 LAB — URIC ACID: Uric Acid, Serum: 5.9 mg/dL (ref 2.5–7.0)

## 2024-11-23 LAB — RHEUMATOID FACTOR: Rheumatoid fact SerPl-aCnc: <10 [IU]/mL (ref <14)

## 2024-11-23 LAB — CK: Total CK: 117 U/L (ref 20–243)

## 2024-11-23 LAB — SEDIMENTATION RATE: Sed Rate: 22 mm/h (ref 0–30)

## 2024-11-23 LAB — VITAMIN D 25 HYDROXY (VIT D DEFICIENCY, FRACTURES): Vit D, 25-Hydroxy: 67 ng/mL (ref 30–100)

## 2024-11-23 LAB — CYCLIC CITRUL PEPTIDE ANTIBODY, IGG: Cyclic Citrullin Peptide Ab: <16 U

## 2024-11-25 ENCOUNTER — Other Ambulatory Visit (HOSPITAL_BASED_OUTPATIENT_CLINIC_OR_DEPARTMENT_OTHER): Payer: Self-pay

## 2024-11-27 ENCOUNTER — Telehealth: Payer: Self-pay | Admitting: Nurse Practitioner

## 2024-11-27 NOTE — Telephone Encounter (Signed)
 Pt is requesting a call from Rosaline Bane regarding her wanting to discuss some personal things and her insurance. She stated it was discussed at her last visit but she'd like to update her. Please advise

## 2024-11-27 NOTE — Telephone Encounter (Signed)
 Tried reaching out to the pt to obtain more information on request to speak with Rosaline Bane, NP, and pt did not answer and voicemail is full at this time.   Will route this message to Rosaline Bane, NP as an FYI, as well as keep this in our triage basket, in the case pt returns a call back to the office.

## 2024-11-30 NOTE — Progress Notes (Unsigned)
 Office Visit Note  Patient: Savannah Cook             Date of Birth: 1966-01-30           MRN: [id number]             PCP: Nedra Tinnie LABOR, NP Referring: Nedra Tinnie LABOR, NP Visit Date: 12/02/2024 Occupation: Data Unavailable  Subjective:  Pain in multiple joints  History of Present Illness: Savannah Cook is a 58 y.o. female returns today after her initial workup for polyarthralgia and myalgia.  She states she continues to have pain and discomfort in her neck, hands and her knees.  She notices stiffness in her hands especially her right thumb.  The right middle trigger finger has improved.  She gives history of fatigue, shortness of breath and insomnia.  She has been using CPAP for sleep apnea.    Activities of Daily Living:  Patient reports morning stiffness for 1 hour.   Patient Denies nocturnal pain.  Difficulty dressing/grooming: Reports Difficulty climbing stairs: Denies Difficulty getting out of chair: Denies Difficulty using hands for taps, buttons, cutlery, and/or writing: Denies  Review of Systems  Constitutional:  Positive for fatigue.  HENT:  Negative for mouth sores and mouth dryness.   Eyes:  Negative for dryness.  Respiratory:  Positive for shortness of breath.   Cardiovascular:  Negative for chest pain and palpitations.  Gastrointestinal:  Negative for blood in stool, constipation and diarrhea.  Endocrine: Positive for increased urination.  Genitourinary:  Negative for involuntary urination.  Musculoskeletal:  Positive for joint pain, joint pain, myalgias, morning stiffness, muscle tenderness and myalgias. Negative for gait problem, joint swelling and muscle weakness.  Skin:  Positive for hair loss. Negative for color change, rash and sensitivity to sunlight.  Allergic/Immunologic: Negative for susceptible to infections.  Neurological:  Negative for dizziness and headaches.  Hematological:  Negative for swollen glands.  Psychiatric/Behavioral:  Negative  for depressed mood and sleep disturbance. The patient is not nervous/anxious.     PMFS History:  Patient Active Problem List   Diagnosis Date Noted   OSA (obstructive sleep apnea) 09/25/2024   COVID-19 09/16/2024   Attention deficit hyperactivity disorder (ADHD) 09/07/2024   Chest pain 09/07/2024   Abnormal uterine bleeding 08/10/2024   Abnormal CT scan 08/10/2024   Morning headache, controlled 06/01/2024   Pain in soft tissues of limb 06/01/2024   Hyperreflexia of lower extremity 06/01/2024   Snoring 06/01/2024   GERD with apnea 06/01/2024   Family history of rheumatoid arthritis 06/01/2024   Vitamin D  deficiency 04/02/2024   Seasonal allergies 02/27/2024   Tremor 10/24/2023   History of colon cancer 09/25/2023   Prediabetes 09/25/2023   Mixed hyperlipidemia 05/17/2023   Paroxysmal atrial fibrillation (HCC) 08/08/2022   Paroxysmal SVT (supraventricular tachycardia) 08/08/2022   Rosacea 01/31/2022   Atrial flutter (HCC) 01/31/2022   Routine general medical examination at a health care facility 01/15/2021   Anxiety and depression 01/15/2021   Hx of colonic polyp - sessile serrated 10/08/2018   Hair loss 01/18/2017   Iron deficiency anemia 01/18/2017   Morbid obesity (HCC) 05/14/2013    Past Medical History:  Diagnosis Date   Allergy 1980   Anxiety    hx of, no medications now   Atrial fibrillation (HCC)    Atrial flutter (HCC)    Colon cancer (HCC) 06/10/2017   Depression 08/17/2024   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   GERD (gastroesophageal reflux disease) September 2025   Dr Tinnie presribed Omeprozol   Hx of colonic polyp - sessile serrated 10/08/2018   Iron deficiency anemia due to chronic blood loss    colon cancer   Seasonal allergies    Shingles 11/2021   Sleep apnea    Not sure just did the testing waiting for results    Family  History  Problem Relation Age of Onset   Diabetes Mother    Hypertension Mother    Bipolar disorder Mother    COPD Mother    Anxiety disorder Mother    Depression Mother    Obesity Mother    Diabetes Father    Hypertension Father    Lung cancer Father    Tremor Father        essential   Cancer Father    Rheum arthritis Father    Diabetes Sister    Diabetes Brother    Heart disease Maternal Grandmother    Colon cancer Maternal Grandfather 95   Stroke Paternal Grandmother    Healthy Daughter    Colon polyps Neg Hx    Esophageal cancer Neg Hx    Rectal cancer Neg Hx    Stomach cancer Neg Hx    Past Surgical History:  Procedure Laterality Date   CHOLECYSTECTOMY N/A 07/25/2017   Procedure: CHOLECYSTECTOMY;  Surgeon: Lily Boas, MD;  Location: [facility];  Service: General;  Laterality: N/A;   COLON SURGERY  07/25/2017   COLONOSCOPY  06/10/2018   w/Dr.[email or web address]    LAPAROSCOPIC PARTIAL COLECTOMY N/A 07/25/2017   Procedure: LAPAROSCOPIC PARTIAL COLECTOMY;  Surgeon: Lily Boas, MD;  Location: [facility];  Service: General;  Laterality: N/A;   UPPER GASTROINTESTINAL ENDOSCOPY  06/10/2018   WISDOM TOOTH EXTRACTION     WRIST SURGERY Right 04/2012   Social History[1] Social History   Social History Narrative   The patient is single, used to live w/ a partner until 11-2020   One daughter   is employed as a arts development officer   2 caffeinated beverages a day     Immunization History  Administered Date(s) Administered   Influenza-Unspecified 11/05/2019   PFIZER(Purple Top)SARS-COV-2 Vaccination 03/14/2020, 04/06/2020, 12/24/2020   Tdap 04/02/2024     Objective: Vital Signs: BP 123/79   Pulse 77   Temp 98.3 F (36.8 C)   Resp 17   Ht 5' 2 (1.575 m)   Wt 227 lb 12.8 oz (103.3 kg)   LMP 10/06/2022 Comment: Last cycle was in 07/2021  BMI 41.67 kg/m    Physical Exam Vitals and nursing note reviewed.  Constitutional:      Appearance: She is well-developed.  HENT:      Head: Normocephalic and atraumatic.  Eyes:     Conjunctiva/sclera: Conjunctivae normal.  Cardiovascular:     Rate and Rhythm: Normal rate and regular rhythm.     Heart sounds: Normal heart sounds.  Pulmonary:     Effort: Pulmonary effort is normal.     Breath sounds: Normal breath sounds.  Abdominal:     General: Bowel sounds are normal.     Palpations: Abdomen is soft.  Musculoskeletal:     Cervical back: Normal range of motion.  Lymphadenopathy:     Cervical: No cervical adenopathy.  Skin:    General: Skin is warm and dry.     Capillary Refill: Capillary refill takes less than 2 seconds.  Neurological:  Mental Status: She is alert and oriented to person, place, and time.  Psychiatric:        Behavior: Behavior normal.      Musculoskeletal Exam: She had limited range of motion of the cervical spine and bilateral trapezius spasm.  Thoracic and lumbar spine were in good range of motion.  Shoulders, elbows, wrist joints, MCPs PIPs and DIPs were in good range of motion.  She had tenderness over her right CMC joint.  Hip joints and knee joints with good range of motion without any warmth swelling or effusion.  She had discomfort range of motion of her knee joints.  There was no tenderness over her ankles or MTPs.  CDAI Exam: CDAI Score: -- Patient Global: --; Provider Global: -- Swollen: --; Tender: -- Joint Exam 12/02/2024   No joint exam has been documented for this visit   There is currently no information documented on the homunculus. Go to the Rheumatology activity and complete the homunculus joint exam.  Investigation: No additional findings.  Imaging: XR Cervical Spine 2 or 3 views Result Date: 11/19/2024 Severe multilevel spondylosis was noted.  Significant narrowing was noted between C4-C5, C5-C6 and C6-C7.  Anterior osteophytes were noted.  Facet joint arthropathy was noted. Impression: These findings are suggestive of multilevel spondylosis and facet joint  arthropathy.  XR KNEE 3 VIEW RIGHT Result Date: 11/19/2024 Moderate medial compartment narrowing with intercondylar osteophytes was noted.  Mild patellofemoral narrowing was noted.  No chondrocalcinosis was noted. Impression: These findings and history of moderate osteoarthritis and mild chondromalacia patella.  XR KNEE 3 VIEW LEFT Result Date: 11/19/2024 No medial or lateral compartment narrowing was noted.  No patellofemoral narrowing was noted.  No chondrocalcinosis was noted. Impression: Unremarkable x-rays of the knee.  XR Hand 2 View Left Result Date: 11/19/2024 CMC, PIP and DIP narrowing was noted.  No MCP, intercarpal radiocarpal joint space narrowing was noted.  No erosive changes were noted. Impression: These findings suggestive of osteoarthritis of the hand.  XR Hand 2 View Right Result Date: 11/19/2024 CMC, PIP and DIP narrowing was noted.  No MCP, intercarpal or radiocarpal joint space narrowing was noted.  Hardware was noted in the right radius. Impression: These findings are suggestive of osteoarthritis of the hand.   Recent Labs: Lab Results  Component Value Date   WBC 6.4 09/25/2024   HGB 13.4 09/25/2024   PLT 267.0 09/25/2024   NA 140 09/25/2024   K 4.2 09/25/2024   CL 104 09/25/2024   CO2 27 09/25/2024   GLUCOSE 98 09/25/2024   BUN 15 09/25/2024   CREATININE 0.72 09/25/2024   BILITOT 0.4 09/25/2024   ALKPHOS 79 09/25/2024   AST 21 09/25/2024   ALT 26 09/25/2024   PROT 7.5 11/19/2024   ALBUMIN 4.2 09/25/2024   CALCIUM  9.1 09/25/2024   GFRAA >60 07/26/2017   SPEP normal, sed rate 22, CK117, TSH 2.36, uric acid 5.9, RF negative, anti-CCP negative, vitamin D  67  Speciality Comments: No specialty comments available.  Procedures:  No procedures performed Allergies: Ceclor [cefaclor]   Assessment / Plan:     Visit Diagnoses: Polyarthralgia-she continues to have pain and discomfort in multiple joints.  She complains of discomfort in her cervical spine, hands,  knees.  Labs obtained at the last visit including uric acid, rheumatoid factor, anti-CCP and sed rate are within normal limits. SPEP normal, sed rate 22, CK117, TSH 2.36, uric acid 5.9, RF negative, anti-CCP negative, vitamin D  67.  Lab results were reviewed with  the patient.  Primary osteoarthritis of both hands -she complains of pain and stiffness in her both hands.  Joint protection muscle strengthening was discussed.  Benefits of right CMC brace was discussed.  A handout on exercise was given.  Clinical and radiographic findings with history of osteoarthritis.  X-rays obtained at the last visit were reviewed with the patient.  Iliotibial band syndrome, unspecified laterality-IT band stretches were discussed.  Chronic pain of both knees -she continues to have discomfort in her knee joints.  X-rays of the right knee joint obtained at the last visit showed moderate osteoarthritis and mild chondromalacia patella.  Left knee joint were unremarkable.  X-rays were reviewed with the patient.  A handout on lower extremity muscle strengthening exercise was given.  DDD (degenerative disc disease), cervical -she continues to have neck stiffness and headaches.  X-rays obtained at the last visit showed multilevel spondylosis and facet joint arthropathy.  X-rays were reviewed with the patient.  Patient is unable to go for physical therapy.  A handout on neck exercises was given.  Myalgia -she had no muscular weakness or tenderness on the examination.  She was able to get up from the squatting position without any difficulty.  CK normal.  Lab values were reviewed with the patient.  Other fatigue-TSH and SPEP were normal.  Elevated C-reactive protein (CRP) - Repeat sed rate normal.  Other medical problems are still as follows:  Hair loss  Paroxysmal atrial fibrillation (HCC)  Prediabetes  Gastroesophageal reflux disease without esophagitis  Mixed hyperlipidemia  Rosacea  Vitamin D   deficiency  Other iron deficiency anemia  Seasonal allergies  Anxiety and depression  OSA (obstructive sleep apnea)  Family history of rheumatoid arthritis-father  Orders: No orders of the defined types were placed in this encounter.  No orders of the defined types were placed in this encounter.    Follow-Up Instructions: Return if symptoms worsen or fail to improve, for Osteoarthritis.   Maya Nash, MD  Note - This record has been created using Animal nutritionist.  Chart creation errors have been sought, but may not always  have been located. Such creation errors do not reflect on  the standard of medical care.     [1]  Social History Tobacco Use   Smoking status: Former    Current packs/day: 0.00    Types: Cigarettes    Quit date: 12/17/1985    Years since quitting: 38.9    Passive exposure: Past   Smokeless tobacco: Never   Tobacco comments:    Former smoker 04/19/22  Vaping Use   Vaping status: Never Used  Substance Use Topics   Alcohol use: Yes    Comment: occ   Drug use: No

## 2024-12-01 ENCOUNTER — Ambulatory Visit (HOSPITAL_BASED_OUTPATIENT_CLINIC_OR_DEPARTMENT_OTHER): Admitting: Cardiology

## 2024-12-01 NOTE — Telephone Encounter (Signed)
 Patient has not returned call.  She has appointment with Savannah Cook on Thursday 12/03/24.  Phone note has been routed to provider as RICK.

## 2024-12-02 ENCOUNTER — Ambulatory Visit: Attending: Rheumatology | Admitting: Rheumatology

## 2024-12-02 ENCOUNTER — Encounter: Payer: Self-pay | Admitting: Rheumatology

## 2024-12-02 VITALS — BP 123/79 | HR 77 | Temp 98.3°F | Resp 17 | Ht 62.0 in | Wt 227.8 lb

## 2024-12-02 DIAGNOSIS — I480 Paroxysmal atrial fibrillation: Secondary | ICD-10-CM | POA: Diagnosis not present

## 2024-12-02 DIAGNOSIS — M5030 Other cervical disc degeneration, unspecified cervical region: Secondary | ICD-10-CM

## 2024-12-02 DIAGNOSIS — M19042 Primary osteoarthritis, left hand: Secondary | ICD-10-CM

## 2024-12-02 DIAGNOSIS — M19041 Primary osteoarthritis, right hand: Secondary | ICD-10-CM

## 2024-12-02 DIAGNOSIS — L719 Rosacea, unspecified: Secondary | ICD-10-CM

## 2024-12-02 DIAGNOSIS — M25561 Pain in right knee: Secondary | ICD-10-CM

## 2024-12-02 DIAGNOSIS — Z8261 Family history of arthritis: Secondary | ICD-10-CM

## 2024-12-02 DIAGNOSIS — K219 Gastro-esophageal reflux disease without esophagitis: Secondary | ICD-10-CM

## 2024-12-02 DIAGNOSIS — D508 Other iron deficiency anemias: Secondary | ICD-10-CM

## 2024-12-02 DIAGNOSIS — R7303 Prediabetes: Secondary | ICD-10-CM | POA: Diagnosis not present

## 2024-12-02 DIAGNOSIS — M2550 Pain in unspecified joint: Secondary | ICD-10-CM

## 2024-12-02 DIAGNOSIS — M791 Myalgia, unspecified site: Secondary | ICD-10-CM

## 2024-12-02 DIAGNOSIS — R7982 Elevated C-reactive protein (CRP): Secondary | ICD-10-CM | POA: Diagnosis not present

## 2024-12-02 DIAGNOSIS — M25562 Pain in left knee: Secondary | ICD-10-CM

## 2024-12-02 DIAGNOSIS — G8929 Other chronic pain: Secondary | ICD-10-CM

## 2024-12-02 DIAGNOSIS — L659 Nonscarring hair loss, unspecified: Secondary | ICD-10-CM

## 2024-12-02 DIAGNOSIS — R5383 Other fatigue: Secondary | ICD-10-CM | POA: Diagnosis not present

## 2024-12-02 DIAGNOSIS — M7630 Iliotibial band syndrome, unspecified leg: Secondary | ICD-10-CM | POA: Diagnosis not present

## 2024-12-02 DIAGNOSIS — E782 Mixed hyperlipidemia: Secondary | ICD-10-CM

## 2024-12-02 DIAGNOSIS — G4733 Obstructive sleep apnea (adult) (pediatric): Secondary | ICD-10-CM

## 2024-12-02 DIAGNOSIS — J302 Other seasonal allergic rhinitis: Secondary | ICD-10-CM

## 2024-12-02 DIAGNOSIS — F419 Anxiety disorder, unspecified: Secondary | ICD-10-CM

## 2024-12-02 DIAGNOSIS — E559 Vitamin D deficiency, unspecified: Secondary | ICD-10-CM

## 2024-12-02 DIAGNOSIS — F32A Depression, unspecified: Secondary | ICD-10-CM

## 2024-12-02 NOTE — Patient Instructions (Signed)
 Exercises for Chronic Knee Pain Chronic knee pain is pain that lasts longer than 3 months. For most people with chronic knee pain, exercise and weight loss is an important part of treatment. Your health care provider may want you to focus on: Making the muscles that support your knee stronger. This can take pressure off your knee and reduce pain. Preventing knee stiffness. How far you can move your knee, keeping it there or making it farther. Losing weight (if this applies) to take pressure off your knee, lower your risk for injury, and make it easier for you to exercise. Your provider will help you make an exercise program that fits your needs and physical abilities. Below are simple, low-impact exercises you can do at home. Ask your provider or physical therapist how often you should do your exercise program and how many times to repeat each exercise. General safety tips  Get your provider's approval before doing any exercises. Start slowly and stop any time you feel pain. Do not exercise if your knee pain is flaring up. Warm up first. Stretching a cold muscle can cause an injury. Do 5-10 minutes of easy movement or light stretching before beginning your exercises. Do 5-10 minutes of low-impact activity (like walking or cycling) before starting strengthening exercises. Contact your provider any time you have pain during or after exercising. Exercise can cause discomfort but should not be painful. It is normal to be a little stiff or sore after exercising. Stretching and range-of-motion exercises Front thigh stretch  Stand up straight and support your body by holding on to a chair or resting one hand on a wall. With your legs straight and close together, bend one knee to lift your heel up toward your butt. Using one hand for support, grab your ankle with your free hand. Pull your foot up closer toward your butt to feel the stretch in front of your thigh. Hold the stretch for 30  seconds. Repeat __________ times. Complete this exercise __________ times a day. Back thigh stretch  Sit on the floor with your back straight and your legs out straight in front of you. Place the palms of your hands on the floor and slide them toward your feet as you bend at the hip. Try to touch your nose to your knees and feel the stretch in the back of your thighs. Hold for 30 seconds. Repeat __________ times. Complete this exercise __________ times a day. Calf stretch  Stand facing a wall. Place the palms of your hands flat against the wall, arms extended, and lean slightly against the wall. Get into a lunge position with one leg bent at the knee and the other leg stretched out straight behind you. Keep both feet facing the wall and increase the bend in your knee while keeping the heel of the other leg flat on the ground. You should feel the stretch in your calf. Hold for 30 seconds. Repeat __________ times. Complete this exercise __________ times a day. Strengthening exercises Straight leg lift  Lie on your back with one knee bent and the other leg out straight. Slowly lift the straight leg without bending the knee. Lift until your foot is about 12 inches (30 cm) off the floor. Hold for 3-5 seconds and slowly lower your leg. Repeat __________ times. Complete this exercise __________ times a day. Single leg dip  Stand between two chairs and put both hands on the backs of the chairs for support. Extend one leg out straight with your body  weight resting on the heel of the standing leg. Slowly bend your standing knee to dip your body to the level that is comfortable for you. Hold for 3-5 seconds. Repeat __________ times. Complete this exercise __________ times a day. Hamstring curls  Stand straight, knees close together, facing the back of a chair. Hold on to the back of a chair with both hands. Keep one leg straight. Bend the other knee while bringing the heel up toward the butt  until the knee is bent at a 90-degree angle (right angle). Hold for 3-5 seconds. Repeat __________ times. Complete this exercise __________ times a day. Wall squat  Stand straight with your back, hips, and head against a wall. Step forward one foot at a time with your back still against the wall. Your feet should be 2 feet (61 cm) from the wall at shoulder width. Keeping your back, hips, and head against the wall, slide down the wall to as close to a sitting position as you can get. Hold for 5-10 seconds, then slowly slide back up. Repeat __________ times. Complete this exercise __________ times a day. Step-ups  Stand in front of a sturdy platform or stool that is about 6 inches (15 cm) high. Slowly step up with your left / right foot, keeping your knee in line with your hip and foot. Do not let your knee bend so far that you cannot see your toes. Hold on to a chair for balance, but do not use it for support. Slowly unlock your knee and lower yourself to the starting position. Repeat __________ times. Complete this exercise __________ times a day. Contact a health care provider if: Your exercises cause pain. Your pain is worse after you exercise. Your pain prevents you from doing your exercises. This information is not intended to replace advice given to you by your health care provider. Make sure you discuss any questions you have with your health care provider. Document Revised: 12/18/2022 Document Reviewed: 12/18/2022 Else[employer] Patient Education  2024 [employer]Hand Exercises Hand exercises can be helpful for almost anyone. They can strengthen your hands and improve flexibility and movement. The exercises can also increase blood flow to the hands. These results can make your work and daily tasks easier for you. Hand exercises can be especially helpful for people who have joint pain from arthritis or nerve damage from using their hands over and over. These exercises can also help people  who injure a hand. Exercises Most of these hand exercises are gentle stretching and motion exercises. It is usually safe to do them often throughout the day. Warming up your hands before exercise may help reduce stiffness. You can do this with gentle massage or by placing your hands in warm water for 10-15 minutes. It is normal to feel some stretching, pulling, tightness, or mild discomfort when you begin new exercises. In time, this will improve. Remember to always be careful and stop right away if you feel sudden, very bad pain or your pain gets worse. You want to get better and be safe. Ask your health care provider which exercises are safe for you. Do exercises exactly as told by your provider and adjust them as told. Do not begin these exercises until told by your provider. Knuckle bend or claw fist  Stand or sit with your arm, hand, and all five fingers pointed straight up. Make sure to keep your wrist straight. Gently bend your fingers down toward your palm until the tips of your fingers are  touching your palm. Keep your big knuckle straight and only bend the small knuckles in your fingers. Hold this position for 10 seconds. Straighten your fingers back to your starting position. Repeat this exercise 5-10 times with each hand. Full finger fist  Stand or sit with your arm, hand, and all five fingers pointed straight up. Make sure to keep your wrist straight. Gently bend your fingers into your palm until the tips of your fingers are touching the middle of your palm. Hold this position for 10 seconds. Extend your fingers back to your starting position, stretching every joint fully. Repeat this exercise 5-10 times with each hand. Straight fist  Stand or sit with your arm, hand, and all five fingers pointed straight up. Make sure to keep your wrist straight. Gently bend your fingers at the big knuckle, where your fingers meet your hand, and at the middle knuckle. Keep the knuckle at the  tips of your fingers straight and try to touch the bottom of your palm. Hold this position for 10 seconds. Extend your fingers back to your starting position, stretching every joint fully. Repeat this exercise 5-10 times with each hand. Tabletop  Stand or sit with your arm, hand, and all five fingers pointed straight up. Make sure to keep your wrist straight. Gently bend your fingers at the big knuckle, where your fingers meet your hand, as far down as you can. Keep the small knuckles in your fingers straight. Think of forming a tabletop with your fingers. Hold this position for 10 seconds. Extend your fingers back to your starting position, stretching every joint fully. Repeat this exercise 5-10 times with each hand. Finger spread  Place your hand flat on a table with your palm facing down. Make sure your wrist stays straight. Spread your fingers and thumb apart from each other as far as you can until you feel a gentle stretch. Hold this position for 10 seconds. Bring your fingers and thumb tight together again. Hold this position for 10 seconds. Repeat this exercise 5-10 times with each hand. Making circles  Stand or sit with your arm, hand, and all five fingers pointed straight up. Make sure to keep your wrist straight. Make a circle by touching the tip of your thumb to the tip of your index finger. Hold for 10 seconds. Then open your hand wide. Repeat this motion with your thumb and each of your fingers. Repeat this exercise 5-10 times with each hand. Thumb motion  Sit with your forearm resting on a table and your wrist straight. Your thumb should be facing up toward the ceiling. Keep your fingers relaxed as you move your thumb. Lift your thumb up as high as you can toward the ceiling. Hold for 10 seconds. Bend your thumb across your palm as far as you can, reaching the tip of your thumb for the small finger (pinkie) side of your palm. Hold for 10 seconds. Repeat this exercise 5-10  times with each hand. Grip strengthening  Hold a stress ball or other soft ball in the middle of your hand. Slowly increase the pressure, squeezing the ball as much as you can without causing pain. Think of bringing the tips of your fingers into the middle of your palm. All of your finger joints should bend when doing this exercise. Hold your squeeze for 10 seconds, then relax. Repeat this exercise 5-10 times with each hand. Contact a health care provider if: Your hand pain or discomfort gets much worse when you do  an exercise. Your hand pain or discomfort does not improve within 2 hours after you exercise. If you have either of these problems, stop doing these exercises right away. Do not do them again unless your provider says that you can. Get help right away if: You develop sudden, severe hand pain or swelling. If this happens, stop doing these exercises right away. Do not do them again unless your provider says that you can. This information is not intended to replace advice given to you by your health care provider. Make sure you discuss any questions you have with your health care provider. Document Revised: 12/18/2022 Document Reviewed: 12/18/2022 Elsevier Patient Education  2024 [employer]Cervical Strain and Sprain Rehab Ask your health care provider which exercises are safe for you. Do exercises exactly as told by your health care provider and adjust them as directed. It is normal to feel mild stretching, pulling, tightness, or discomfort as you do these exercises. Stop right away if you feel sudden pain or your pain gets worse. Do not begin these exercises until told by your health care provider. Stretching and range-of-motion exercises Cervical side bending  Using good posture, sit on a stable chair or stand up. Without moving your shoulders, slowly tilt your left / right ear to your shoulder until you feel a stretch in the neck muscles on the opposite side. You should be looking  straight ahead. Hold for __________ seconds. Repeat with the other side of your neck. Repeat __________ times. Complete this exercise __________ times a day. Cervical rotation  Using good posture, sit on a stable chair or stand up. Slowly turn your head to the side as if you are looking over your left / right shoulder. Keep your eyes level with the ground. Stop when you feel a stretch along the side and the back of your neck. Hold for __________ seconds. Repeat this by turning to your other side. Repeat __________ times. Complete this exercise __________ times a day. Thoracic extension and pectoral stretch  Roll a towel or a small blanket so it is about 4 inches (10 cm) in diameter. Lie down on your back on a firm surface. Put the towel in the middle of your back across your spine. It should not be under your shoulder blades. Put your hands behind your head and let your elbows fall out to your sides. Hold for __________ seconds. Repeat __________ times. Complete this exercise __________ times a day. Strengthening exercises Upper cervical flexion  Lie on your back with a thin pillow behind your head or a small, rolled-up towel under your neck. Gently tuck your chin toward your chest and nod your head down to look toward your feet. Do not lift your head off the pillow. Hold for __________ seconds. Release the tension slowly. Relax your neck muscles completely before you repeat this exercise. Repeat __________ times. Complete this exercise __________ times a day. Cervical extension  Stand about 6 inches (15 cm) away from a wall, with your back facing the wall. Place a soft object, about 6-8 inches (15-20 cm) in diameter, between the back of your head and the wall. A soft object could be a small pillow, a ball, or a folded towel. Gently tilt your head back and press into the soft object. Keep your jaw and forehead relaxed. Hold for __________ seconds. Release the tension slowly. Relax  your neck muscles completely before you repeat this exercise. Repeat __________ times. Complete this exercise __________ times a day. Posture and body  mechanics Body mechanics refer to the movements and positions of your body while you do your daily activities. Posture is part of body mechanics. Good posture and healthy body mechanics can help to relieve stress in your body's tissues and joints. Good posture means that your spine is in its natural S-curve position (your spine is neutral), your shoulders are pulled back slightly, and your head is not tipped forward. The following are general guidelines for using improved posture and body mechanics in your everyday activities. Sitting  When sitting, keep your spine neutral and keep your feet flat on the floor. Use a footrest, if needed, and keep your thighs parallel to the floor. Avoid rounding your shoulders. Avoid tilting your head forward. When working at a desk or a computer, keep your desk at a height where your hands are slightly lower than your elbows. Slide your chair under your desk so you are close enough to maintain good posture. When working at a computer, place your monitor at a height where you are looking straight ahead and you do not have to tilt your head forward or downward to look at the screen. Standing  When standing, keep your spine neutral and keep your feet about hip-width apart. Keep a slight bend in your knees. Your ears, shoulders, and hips should line up. When you do a task in which you stand in one place for a long time, place one foot up on a stable object that is 2-4 inches (5-10 cm) high, such as a footstool. This helps keep your spine neutral. Resting When lying down and resting, avoid positions that are most painful for you. Try to support your neck in a neutral position. You can use a contour pillow or a small rolled-up towel. Your pillow should support your neck but not push on it. This information is not intended to  replace advice given to you by your health care provider. Make sure you discuss any questions you have with your health care provider. Document Revised: 04/08/2023 Document Reviewed: 06/25/2022 Elsevier Patient Education  2024 [employer].

## 2024-12-03 ENCOUNTER — Other Ambulatory Visit: Payer: Self-pay

## 2024-12-03 ENCOUNTER — Encounter (HOSPITAL_BASED_OUTPATIENT_CLINIC_OR_DEPARTMENT_OTHER): Payer: Self-pay | Admitting: Nurse Practitioner

## 2024-12-03 ENCOUNTER — Ambulatory Visit (HOSPITAL_BASED_OUTPATIENT_CLINIC_OR_DEPARTMENT_OTHER): Admitting: Nurse Practitioner

## 2024-12-03 ENCOUNTER — Other Ambulatory Visit (HOSPITAL_BASED_OUTPATIENT_CLINIC_OR_DEPARTMENT_OTHER): Payer: Self-pay

## 2024-12-03 ENCOUNTER — Encounter (HOSPITAL_BASED_OUTPATIENT_CLINIC_OR_DEPARTMENT_OTHER): Payer: Self-pay

## 2024-12-03 VITALS — BP 120/72 | HR 83 | Ht 62.0 in | Wt 228.0 lb

## 2024-12-03 DIAGNOSIS — I480 Paroxysmal atrial fibrillation: Secondary | ICD-10-CM

## 2024-12-03 DIAGNOSIS — E785 Hyperlipidemia, unspecified: Secondary | ICD-10-CM | POA: Diagnosis not present

## 2024-12-03 DIAGNOSIS — G4733 Obstructive sleep apnea (adult) (pediatric): Secondary | ICD-10-CM

## 2024-12-03 DIAGNOSIS — Z7189 Other specified counseling: Secondary | ICD-10-CM

## 2024-12-03 DIAGNOSIS — I2510 Atherosclerotic heart disease of native coronary artery without angina pectoris: Secondary | ICD-10-CM | POA: Diagnosis not present

## 2024-12-03 DIAGNOSIS — R072 Precordial pain: Secondary | ICD-10-CM

## 2024-12-03 DIAGNOSIS — Z79899 Other long term (current) drug therapy: Secondary | ICD-10-CM

## 2024-12-03 DIAGNOSIS — I471 Supraventricular tachycardia, unspecified: Secondary | ICD-10-CM | POA: Diagnosis not present

## 2024-12-03 MED ORDER — ZEPBOUND 5 MG/0.5ML ~~LOC~~ SOAJ
5.0000 mg | SUBCUTANEOUS | 1 refills | Status: DC
  Filled 2024-12-03: qty 2, 28d supply, fill #0

## 2024-12-03 NOTE — Patient Instructions (Addendum)
 Medication Instructions:   DISCONTINUE Diltiazem   ( 120 mg).  *If you need a refill on your cardiac medications before your next appointment, please call your pharmacy*  Lab Work:  None ordered.  If you have labs (blood work) drawn today and your tests are completely normal, you will receive your results only by: MyChart Message (if you have MyChart) OR A paper copy in the mail If you have any lab test that is abnormal or we need to change your treatment, we will call you to review the results.  Testing/Procedures:  None ordered.  Follow-Up: At [facility], you and your health needs are our priority.  As part of our continuing mission to provide you with exceptional heart care, our providers are all part of one team.  This team includes your primary Cardiologist (physician) and Advanced Practice Providers or APPs (Physician Assistants and Nurse Practitioners) who all work together to provide you with the care you need, when you need it.  Your next appointment:   6 month(s)  Provider:   Rosaline Bane, NP    We recommend signing up for the patient portal called MyChart.  Sign up information is provided on this After Visit Summary.  MyChart is used to connect with patients for Virtual Visits (Telemedicine).  Patients are able to view lab/test results, encounter notes, upcoming appointments, etc.  Non-urgent messages can be sent to your provider as well.   To learn more about what you can do with MyChart, go to [email or web address].   Other Instructions  Your physician wants you to follow-up in: 6 months.  You will receive a reminder letter in the mail two months in advance. If you don't receive a letter, please call our office to schedule the follow-up appointment.  HOW TO TAKE YOUR BLOOD PRESSURE  Rest 5 minutes before taking your blood pressure. Don't  smoke or drink caffeinated beverages for at least 30 minutes before. Take your blood pressure before (not  after) you eat. Sit comfortably with your back supported and both feet on the floor ( don't cross your legs). Elevate your arm to heart level on a table or a desk. Use the proper sized cuff.  It should fit smoothly and snugly around your bare upper arm.  There should be  Enough room to slip a fingertip under the cuff.  The bottom edge of the cuff should be 1 inch above the crease Of the elbow. Please monitor your blood pressure once daily 2 hours after your am medication. Your goal for  blood pressure is 130 (systolic) top number or over 80 ( diastolic) bottom number or lower.   ----Avoid cold medicines with D or DM at the end of them----      This is the magnesium Rosaline Suggests: Mag L-Threonate  Check the price of Zep-Bound downstairs.

## 2024-12-03 NOTE — Progress Notes (Signed)
 Cardiology Office Note   Date:  12/03/2024  ID:  Savannah Cook, DOB 20-Dec-1965, MRN [id number] PCP: Nedra Tinnie LABOR, NP  [facility] Providers Cardiologist:  Shelda Bruckner, MD Cardiology APP:  Dow Arland BROCKS, NP (Inactive)     PMH Atrial fibrillation/flutter GERD Colon cancer Iron deficiency anemia Obesity OSA on CPAP Former tobacco abuse Quit 1987 Coronary artery calcification CT Calcium  score 07/14/24 CAC score 37.6 (83rd percentile) LM 0, LAD 0, LCx 0, RCA 37.6  Initially seen by Arland Dow, NP in A-fib clinic.  She noted palpitations and was noted to be in typical atrial flutter in PCP office in February 2023.  She was taking several supplements at the time and had been started on Cardizem  30 mg 3 times daily and Eliquis  5 mg twice daily for CHA2DS2-VASc score of 1.  Cardiac monitor shows intermittent atrial flutter.  EKG on 03/13/2022 showed atrial fibrillation at 143 bpm.  She had reduced Eliquis  to once daily.  Sleep study was ordered but she had a very high deductible and insurance did not approve it.  She was advised to stop Eliquis  given CHA2DS2-VASc score of 1 and return to sinus rhythm.  She obtained a Kardia device to help monitor for arrhythmias at home.  She established care with Dr. Bruckner 06/2022.  She was struggling with anxiety, depression, and associated tension headaches.  She felt the symptoms exacerbated A-fib.  Last cardiology clinic visit was 06/17/2023 with Dr. Bruckner. Asked about starting turmeric for stiffness in her legs.  She also complained of left foot swelling by the end of the day.  Noted whenever she was anxious/stressed at work or rushing, she will start to flutter associated with cough at onset.  Palpitations may last up to 30 minutes.  At PCP office visit 05/17/2023 she was found to be in asymptomatic atrial fibrillation at 119 bpm.  She endorsed snoring and according to her fianc it had worsened recently.  Management  strategies were discussed and low-dose diltiazem  was started.  Continue to consider sleep study once affordable. 10-year ASCVD risk score 6.9%.  She was not being treated for hypertension. Heart healthy lifestyle modification encouraged and 1 year follow-up.  Seen by me on 06/16/24 for follow-up. Was diagnosed with atrial fibrillation in 2023 and has been on diltiazem  120 mg daily. Recently, she started taking it every other day to evaluate its effects. Has not experienced recent heart flutter episodes or palpitations and questions the necessity of the medication. Experiencing heaviness and tiredness in her chest and upper legs at times and bilateral foot edema. Admits not as active as she would like to be due to limitations from Planter fasciitis. Weight gain since menopause in 2022, which she attributes to hormonal changes. No DOE, orthopnea, PND, presyncope, syncope. Suspects sleep apnea due to loud snoring and unrefreshing sleep but has been unable to obtain a sleep study due to insurance issues. Is motivated to improve her health through diet and exercise, aiming to walk daily and use her gym membership, which includes pools and a hot tub. She consumes two cups of coffee daily and occasionally uses a caffeine supplement called Zipfizz without feeling jittery. She takes vitamin D , elderberry, and a multivitamin for those over 55. She prefers to minimize medication use.   CT calcium  score obtained for further risk stratification with CAC of 37.6 (87th percentile).  She was advised to start rosuvastatin  5 mg daily for LDL 114 on 07/07/2024. Lung findings reveal minimal fibrotic interstitial lung disease,  mostly new when compared to exam in 2018.  Referred to pulmonology by PCP and seen by Dr. Theophilus on 09/02/2024.  Diagnosed with moderate to severe obstructive sleep apnea with recommendation for CPAP.   Seen by me on 09/10/2024 to discuss her cardiovascular health. 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 During this time she was experiencing chest tightness and a general feeling of unwell. She continued diltiazem  120 mg every other day until 4 days ago without symptoms of fluttering. Recent 30 lb weight gain, interested in GLP1 therapy for weight management. Her insurance initially denied coverage for Zepbound , but she is hopeful for approval due to her BMI and comorbid conditions. Dealing with various health concerns including seeing pulmonology for findings concerning for interstitial lung disease on CT, having a sleep test with neurology, and concerns about weight gain and general cardiovascular health. No chest pain, dyspnea, orthopnea, PND, presyncope or syncope.  Unfortunately, GLP-1 was not covered by insurance and the cost was not feasible for her.   History of Present Illness Discussed the use of AI scribe software for clinical note transcription with the patient, who gave verbal consent to proceed.  History of Present Illness Savannah Cook is a very pleasant 58 year old female who is here today for medication management. She reports her chest symptoms have changed from a flutter sensation to chest tightness and shortness of breath with exertion, such as raking leaves.  No associated n/v, diaphoresis, or lightheadedness.  She denies palpitations. Is using CPAP for sleep apnea but continues to wake up with a headache which she attributes to neck pain.  She has been evaluated by rheumatology and diagnosed with polyarthralgia and osteoarthritis.  Unfortunately, she was laid off from her job a few days ago.  This was a shock to her and she is trying to make sure she has all of her necessary medications prior to lapse of insurance. She did not start taking  rosuvastatin  consistently until December 1 due to work stress and fatigue.  She is tolerating it without any concerning side effects. She recently purchased a home BP  cuff for monitoring.  Monitors her blood pressure and heart rate at home, especially with anxiety or exertion. She is hopeful to have a new job within a few months.   Discussed the use of AI scribe software for clinical note transcription with the patient, who gave verbal consent to proceed.  ROS: See HPI  Studies Reviewed       No results found for: LIPOA  Risk Assessment/Calculations  CHA2DS2-VASc Score = 1   This indicates a 0.6% annual risk of stroke. The patient's score is based upon: CHF History: 0 HTN History: 0 Diabetes History: 0 Stroke History: 0 Vascular Disease History: 0 Age Score: 0 Gender Score: 1            Physical Exam VS:  BP 120/72 (BP Location: Left Arm, Patient Position: Sitting, Cuff Size: Large)   Pulse 83   Ht 5' 2 (1.575 m)   Wt 228 lb (103.4 kg)   LMP 10/06/2022 Comment: Last cycle was in 07/2021  SpO2 97%   BMI 41.70 kg/m    Wt Readings from Last 3 Encounters:  12/03/24 228 lb (103.4 kg)  12/02/24 227 lb 12.8 oz (103.3 kg)  11/19/24 227 lb 9.6 oz (103.2 kg)    GEN: Obese, well developed in no acute distress  NECK: No JVD; No carotid bruits CARDIAC: RRR, no murmurs, rubs, gallops RESPIRATORY:  Clear to auscultation without rales, wheezing or rhonchi  ABDOMEN: Soft, non-tender, non-distended EXTREMITIES:  No edema; No deformity   Assessment & Plan Paroxysmal Atrial Fibrillation SVT Cardiac monitor 02/2022 revealed multiple episodes of PAF, longest episode of atrial flutter was 6 hours 20 minutes, rare episodes of SVT lasting up to 21 seconds. Since that time, overall A-fib burden has been low.  Has occasional chest tightness and shortness of breath.  Admits that some of this may be secondary to obesity and deconditioning.  She has started working out more recently.  She would like to discontinue diltiazem  since she has been taking it every other day and not daily and there has been no evidence of return of A-fib.   - Discontinue diltiazem   120 mg  - Monitor home BP for goal  < 130/80 and HR for goal 60-100 bpm -Recommendation to work on lifestyle modification to improve diet, incorporate regular exercise and aim for weight loss  Chest pain Cardiac Risk Assessment CT calcium  score of 37.6 (83rd percentile) with all calcification in RCA.  We reviewed this result in detail.  She has been having chest tightness associated with stress but symptoms do not occur with every episode of exertion and are atypical for angina.  BP is well controlled.  No indication for further ischemia evaluation at this time. - Continue rosuvastatin   -Continue lifestyle modification to incorporate more exercise, healthy, plant forward diet, weight loss for ASCVD risk reduction  Hyperlipidemia LDL goal < 70 Lipid panel completed 09/25/2024 with total cholesterol 194, triglycerides 124, HDL 60, and LDL 109. Goal LDL is 70 or lower given evidence of coronary artery disease.  She just started rosuvastatin  5 mg daily on 11/16/2024. - Continue rosuvastatin  5 mg daily - Plan repeat lipid panel and ALT in 2 to 3 months  Obesity She is struggling with weight gain of 30 lb recently. She would like to pursue GLP-1 agonist therapy for weight loss and is concerned about cost.  We will continue to seek feasible cash price. -Aim for at least 150 minutes of moderate intensity exercise each week -Aim to lift weights at least 20 to 30 minutes 3 days a week -Heart healthy, mostly whole food diet avoiding saturated fat, processed foods, sugar, and other simple carbohydrates -Seek coverage for GLP-1  Sleep Apnea  She is now on CPAP and is tolerating it without significant problems.   - Management per neurology  Medication Management   Concerned about medication refills and insurance coverage due to job loss. Ensure adequate refills for diltiazem , rosuvastatin , and omeprazole . Consider cash prices for medications if insurance coverage lapses. - Notify us  with cost and/or  refill concerns - Discontinue diltiazem  and monitor home BP and HR; would recommend restarting diltiazem  if HR consistently > 100 or SBP consistently > 130/80        Dispo: 6 months with me  Signed, Rosaline Bane, NP-C

## 2024-12-04 ENCOUNTER — Other Ambulatory Visit (HOSPITAL_BASED_OUTPATIENT_CLINIC_OR_DEPARTMENT_OTHER): Payer: Self-pay

## 2024-12-04 ENCOUNTER — Encounter: Payer: Self-pay | Admitting: Nurse Practitioner

## 2024-12-04 ENCOUNTER — Other Ambulatory Visit: Payer: Self-pay

## 2024-12-04 ENCOUNTER — Ambulatory Visit: Admitting: Nurse Practitioner

## 2024-12-04 VITALS — BP 118/78 | HR 76 | Temp 97.1°F | Ht 62.0 in | Wt 227.8 lb

## 2024-12-04 DIAGNOSIS — F32A Depression, unspecified: Secondary | ICD-10-CM

## 2024-12-04 DIAGNOSIS — R0602 Shortness of breath: Secondary | ICD-10-CM

## 2024-12-04 DIAGNOSIS — M150 Primary generalized (osteo)arthritis: Secondary | ICD-10-CM

## 2024-12-04 DIAGNOSIS — L608 Other nail disorders: Secondary | ICD-10-CM | POA: Diagnosis not present

## 2024-12-04 DIAGNOSIS — I480 Paroxysmal atrial fibrillation: Secondary | ICD-10-CM | POA: Diagnosis not present

## 2024-12-04 DIAGNOSIS — F419 Anxiety disorder, unspecified: Secondary | ICD-10-CM

## 2024-12-04 DIAGNOSIS — T498X1A Poisoning by other topical agents, accidental (unintentional), initial encounter: Secondary | ICD-10-CM

## 2024-12-04 MED ORDER — OMEPRAZOLE 40 MG PO CPDR
40.0000 mg | DELAYED_RELEASE_CAPSULE | Freq: Every day | ORAL | 1 refills | Status: AC
  Filled 2024-12-04: qty 90, 90d supply, fill #0

## 2024-12-04 MED ORDER — ALBUTEROL SULFATE HFA 108 (90 BASE) MCG/ACT IN AERS
2.0000 | INHALATION_SPRAY | Freq: Four times a day (QID) | RESPIRATORY_TRACT | 0 refills | Status: AC | PRN
  Filled 2024-12-04: qty 6.7, 19d supply, fill #0

## 2024-12-04 MED ORDER — ESCITALOPRAM OXALATE 10 MG PO TABS
10.0000 mg | ORAL_TABLET | Freq: Every day | ORAL | 1 refills | Status: AC
  Filled 2024-12-04: qty 90, 90d supply, fill #0

## 2024-12-04 MED ORDER — BENZONATATE 100 MG PO CAPS
100.0000 mg | ORAL_CAPSULE | Freq: Three times a day (TID) | ORAL | 0 refills | Status: AC | PRN
  Filled 2024-12-04: qty 30, 10d supply, fill #0

## 2024-12-04 MED ORDER — CYCLOBENZAPRINE HCL 10 MG PO TABS
10.0000 mg | ORAL_TABLET | Freq: Three times a day (TID) | ORAL | 1 refills | Status: AC | PRN
  Filled 2024-12-04: qty 90, 30d supply, fill #0

## 2024-12-04 MED ORDER — IBUPROFEN 800 MG PO TABS
800.0000 mg | ORAL_TABLET | Freq: Three times a day (TID) | ORAL | 1 refills | Status: AC | PRN
  Filled 2024-12-04: qty 90, 30d supply, fill #0

## 2024-12-04 NOTE — Progress Notes (Signed)
 "  Established Patient Office Visit  Subjective   Patient ID: Savannah Cook, female    DOB: 05/26/1966  Age: 58 y.o. MRN: [id number]  Chief Complaint  Patient presents with   Medication Management    Follow up, Rx refills, concerns with nail beds    HPI  Discussed the use of AI scribe software for clinical note transcription with the patient, who gave verbal consent to proceed.  History of Present Illness   Savannah Cook is a 58 year old female with osteoarthritis and atrial fibrillation who presents with musculoskeletal pain and medication management.  She has significant musculoskeletal pain in the neck, hands, knees, and spine. Neck pain is severe with cervical spine narrowing on prior imaging. Pain radiates to the shoulder blade with trapezius muscle spasms. She had xrays recently done with rheumatology.   She has atrial fibrillation and recently stopped taking daily diltiazem . She now monitors symptoms and takes diltiazem  only as needed when she feels heart fluttering. She follows regularly with cardiology.   She has exertional shortness of breath and chest tightness with activities such as raking leaves, along with mild wheezing. She is unsure what is causing this. She denies chest pain.   She has sleep apnea and uses CPAP, which is working well. She is currently without insurance for the next six months and is concerned about maintaining her health and accessing care. She is interested in physical therapy and exercise to help manage her conditions.  She takes rosuvastatin  for cholesterol and recently restarted it after stopping during an illness. She plans follow-up blood work to monitor her cholesterol while back on this medication.       ROS See pertinent positives and negatives per HPI.    Objective:     BP 118/78 (BP Location: Left Arm, Patient Position: Sitting, Cuff Size: Large)   Pulse 76   Temp (!) 97.1 F (36.2 C)   Ht 5' 2 (1.575 m)   Wt 227 lb 12.8 oz  (103.3 kg)   LMP 10/06/2022 Comment: Last cycle was in 07/2021  SpO2 97%   BMI 41.67 kg/m    Physical Exam Vitals and nursing note reviewed.  Constitutional:      General: She is not in acute distress.    Appearance: Normal appearance.  HENT:     Head: Normocephalic.  Eyes:     Conjunctiva/sclera: Conjunctivae normal.  Cardiovascular:     Rate and Rhythm: Normal rate and regular rhythm.     Pulses: Normal pulses.     Heart sounds: Normal heart sounds.  Pulmonary:     Effort: Pulmonary effort is normal.     Breath sounds: Normal breath sounds.  Musculoskeletal:     Cervical back: Normal range of motion.  Skin:    General: Skin is warm.  Neurological:     General: No focal deficit present.     Mental Status: She is alert and oriented to person, place, and time.  Psychiatric:        Mood and Affect: Mood normal.        Behavior: Behavior normal.        Thought Content: Thought content normal.        Judgment: Judgment normal.    The 10-year ASCVD risk score (Arnett DK, et al., 2019) is: 2.6%    Assessment & Plan:   Problem List Items Addressed This Visit       Cardiovascular and Mediastinum   Paroxysmal atrial fibrillation (HCC)  Previously managed with diltiazem , now discontinued. Continue to monitor blood pressure regularly. Use diltiazem  as needed for atrial fibrillation symptoms. Continue collaboration and recommendations from cardiology.         Musculoskeletal and Integument   Primary osteoarthritis involving multiple joints - Primary   Chronic osteoarthritis causes significant pain in her neck, hands, knees, and spine. X-rays reveal narrowing at C4-5, C5-6, and C6-7 with osteophytes. Pain radiates to her shoulder with muscle spasms. Recommended strengthening exercises. Prescribed ibuprofen  800 mg with food for pain management and Flexeril 10mg  TID prn for muscle relaxation, with caution about drowsiness.       Relevant Medications   cyclobenzaprine  (FLEXERIL) 10 MG tablet   ibuprofen  (ADVIL ) 800 MG tablet     Other   Anxiety and depression   Chronic, ongoing. Continue lexapro  10mg  daily. With recent job loss, recommend she focus on self-care as well.       Relevant Medications   escitalopram  (LEXAPRO ) 10 MG tablet   Shortness of breath   Intermittent shortness of breath and wheezing, particularly with exertion. Possible mild asthma contributing to symptoms. Prescribed albuterol inhaler for use as needed before physical activity. Encouraged regular exercise to improve conditioning. Follow-up if symptoms worsen or don't improve.  Start tessalon  TID prn cough.       Other Visit Diagnoses       Nail damage due to nail cosmetic       Nail damage from press-on removal. Apply castor oil or tea tree oil to damaged nails. Consider skin, hair, and nail vitamins with biotin.      Return if symptoms worsen or fail to improve.    Tinnie DELENA Harada, NP  "

## 2024-12-04 NOTE — Assessment & Plan Note (Signed)
 Chronic, ongoing. Continue lexapro  10mg  daily. With recent job loss, recommend she focus on self-care as well.

## 2024-12-04 NOTE — Assessment & Plan Note (Signed)
 Previously managed with diltiazem , now discontinued. Continue to monitor blood pressure regularly. Use diltiazem  as needed for atrial fibrillation symptoms. Continue collaboration and recommendations from cardiology.

## 2024-12-04 NOTE — Assessment & Plan Note (Signed)
 Intermittent shortness of breath and wheezing, particularly with exertion. Possible mild asthma contributing to symptoms. Prescribed albuterol  inhaler for use as needed before physical activity. Encouraged regular exercise to improve conditioning. Follow-up if symptoms worsen or don't improve.  Start tessalon  TID prn cough.

## 2024-12-04 NOTE — Patient Instructions (Signed)
 It was great to see you!  Start albuterol inhaler every 6 hours as needed for shortness of breath   Continue tea tree oil to your nails and taking hair, skin, and nails vitamin   I have refilled your medications   Start ibuprofen  3 times a day as needed for pain  Start flexeril 3 times a day as needed for muscle spasms - this may make you sleepy  Start neck exercises daily   Take care,  Tinnie Harada, NP

## 2024-12-04 NOTE — Assessment & Plan Note (Signed)
 Chronic osteoarthritis causes significant pain in her neck, hands, knees, and spine. X-rays reveal narrowing at C4-5, C5-6, and C6-7 with osteophytes. Pain radiates to her shoulder with muscle spasms. Recommended strengthening exercises. Prescribed ibuprofen  800 mg with food for pain management and Flexeril 10mg  TID prn for muscle relaxation, with caution about drowsiness.

## 2024-12-08 ENCOUNTER — Ambulatory Visit: Admitting: Rheumatology

## 2024-12-08 ENCOUNTER — Other Ambulatory Visit (HOSPITAL_COMMUNITY): Payer: Self-pay

## 2024-12-09 NOTE — Telephone Encounter (Signed)
 error

## 2024-12-16 ENCOUNTER — Telehealth: Payer: Self-pay | Admitting: Adult Health

## 2024-12-16 NOTE — Telephone Encounter (Signed)
 Pt called stating her insurance will be expiring to day and wanted to speak to MD or NP about what's going on and see if she can get some help. Pt stated she lost her job  in dec and that who insurance was threw . Pt is requesting a call back.

## 2024-12-21 NOTE — Telephone Encounter (Signed)
 I called pt,  I could not LM as VM full.

## 2024-12-23 ENCOUNTER — Ambulatory Visit: Admitting: Rheumatology

## 2024-12-23 NOTE — Telephone Encounter (Signed)
 I spoke to pt.  She wanted to relay that she lost her job,  insurance stopped 12-16-2024.  She was able to get [facility] 12-16-2024.  Will upload to mychart (or bring in card when in the office).  She appreciated call back.

## 2025-01-04 ENCOUNTER — Other Ambulatory Visit (HOSPITAL_BASED_OUTPATIENT_CLINIC_OR_DEPARTMENT_OTHER): Payer: Self-pay

## 2025-01-15 ENCOUNTER — Other Ambulatory Visit (HOSPITAL_BASED_OUTPATIENT_CLINIC_OR_DEPARTMENT_OTHER): Payer: Self-pay

## 2025-01-15 ENCOUNTER — Encounter: Payer: Self-pay | Admitting: Nurse Practitioner

## 2025-01-15 ENCOUNTER — Ambulatory Visit

## 2025-01-15 ENCOUNTER — Ambulatory Visit: Admitting: Nurse Practitioner

## 2025-01-15 ENCOUNTER — Ambulatory Visit: Payer: Self-pay | Admitting: Nurse Practitioner

## 2025-01-15 VITALS — BP 112/70 | HR 79 | Temp 97.6°F | Ht 62.0 in | Wt 228.0 lb

## 2025-01-15 DIAGNOSIS — M25561 Pain in right knee: Secondary | ICD-10-CM | POA: Insufficient documentation

## 2025-01-15 DIAGNOSIS — M25532 Pain in left wrist: Secondary | ICD-10-CM | POA: Diagnosis not present

## 2025-01-15 DIAGNOSIS — M25531 Pain in right wrist: Secondary | ICD-10-CM

## 2025-01-15 DIAGNOSIS — M542 Cervicalgia: Secondary | ICD-10-CM | POA: Diagnosis not present

## 2025-01-15 DIAGNOSIS — W19XXXA Unspecified fall, initial encounter: Secondary | ICD-10-CM

## 2025-01-15 DIAGNOSIS — R350 Frequency of micturition: Secondary | ICD-10-CM

## 2025-01-15 DIAGNOSIS — G4733 Obstructive sleep apnea (adult) (pediatric): Secondary | ICD-10-CM | POA: Diagnosis not present

## 2025-01-15 DIAGNOSIS — M25552 Pain in left hip: Secondary | ICD-10-CM

## 2025-01-15 DIAGNOSIS — Z6841 Body Mass Index (BMI) 40.0 and over, adult: Secondary | ICD-10-CM | POA: Diagnosis not present

## 2025-01-15 LAB — POCT URINALYSIS DIPSTICK
Bilirubin, UA: NEGATIVE
Blood, UA: NEGATIVE
Glucose, UA: NEGATIVE
Ketones, UA: NEGATIVE
Leukocytes, UA: NEGATIVE
Nitrite, UA: NEGATIVE
Protein, UA: POSITIVE — AB
Spec Grav, UA: >=1.03 — AB
Urobilinogen, UA: 0.2 U/dL
pH, UA: 5.5

## 2025-01-15 LAB — POCT GLYCOSYLATED HEMOGLOBIN (HGB A1C)
HbA1c POC (<> result, manual entry): 6.2 %
HbA1c, POC (controlled diabetic range): 6.2 % (ref 0.0–7.0)
HbA1c, POC (prediabetic range): 6.2 % (ref 5.7–6.4)
Hemoglobin A1C: 6.2 % — AB (ref 4.0–5.6)

## 2025-01-15 MED ORDER — ZEPBOUND 2.5 MG/0.5ML ~~LOC~~ SOAJ
2.5000 mg | SUBCUTANEOUS | 1 refills | Status: AC
  Filled 2025-01-15: qty 2, 28d supply, fill #0

## 2025-01-15 MED ORDER — OXYBUTYNIN CHLORIDE ER 10 MG PO TB24
10.0000 mg | ORAL_TABLET | Freq: Every day | ORAL | 1 refills | Status: AC
  Filled 2025-01-15: qty 30, 30d supply, fill #0

## 2025-01-15 NOTE — Assessment & Plan Note (Signed)
 Post-fall, she experiences bilateral wrist pain with swelling at the base of her right thumb. Check xray of bilateral wrists.  Continue ibuprofen  800mg  TID with food and flexeril  10mg  as needed for muscle spasms.

## 2025-01-15 NOTE — Patient Instructions (Signed)
 It was great to see you!  We are getting xrays today  Start taking ibuprofen  800mg  3 times a day with food   Use warm compresses on your left hip   We are checking your labs today and will let you know the results via mychart/phone.   Let's follow-up in 6-8 weeks, sooner if you have concerns.  If a referral was placed today, you will be contacted for an appointment. Please note that routine referrals can sometimes take up to 3-4 weeks to process. Please call our office if you haven't heard anything after this time frame.  Take care,  Tinnie Harada, NP

## 2025-01-15 NOTE — Assessment & Plan Note (Signed)
 She has obstructive sleep apnea with AHI 22.6 per hour. Will start zepbound  2.5mg  injection weekly. Follow-up in 6-8 weeks.

## 2025-01-15 NOTE — Assessment & Plan Note (Signed)
 She has a right knee contusion with pain and bruising following a fall. An x-ray of the right knee is ordered to rule out a fracture. Rest and heat application are advised.  Continue ibuprofen  800mg  TID with food and flexeril  10mg  as needed for muscle spasms.

## 2025-01-15 NOTE — Assessment & Plan Note (Signed)
 BMI 41.7. She has been struggling with her weight for the last several years. She is trying to be mindful of what she is eating. She has sleep apnea and zepbound  is indicated for both conditions. Will have her start zepbound  2.5mg  injection weekly. Discussed possible side effects. She will start to follow the plate method with 1/2 vegetables, 1/4 protein, and 1/4 carbs. She will start exercising, slowly increasing to 150 minutes per week after she heals from her fall injuries. Follow-up in 6-8 weeks.

## 2025-01-18 ENCOUNTER — Other Ambulatory Visit (HOSPITAL_BASED_OUTPATIENT_CLINIC_OR_DEPARTMENT_OTHER): Payer: Self-pay

## 2025-01-19 ENCOUNTER — Other Ambulatory Visit (HOSPITAL_BASED_OUTPATIENT_CLINIC_OR_DEPARTMENT_OTHER): Payer: Self-pay

## 2025-01-19 ENCOUNTER — Encounter: Payer: Self-pay | Admitting: Nurse Practitioner

## 2025-01-19 ENCOUNTER — Other Ambulatory Visit (HOSPITAL_COMMUNITY): Payer: Self-pay

## 2025-01-21 ENCOUNTER — Other Ambulatory Visit (HOSPITAL_BASED_OUTPATIENT_CLINIC_OR_DEPARTMENT_OTHER): Payer: Self-pay

## 2025-01-21 ENCOUNTER — Telehealth: Payer: Self-pay

## 2025-01-21 NOTE — Telephone Encounter (Signed)
 Can we get a prior auth for Zepbound  2.5mg /0.76ml?

## 2025-01-22 ENCOUNTER — Other Ambulatory Visit (HOSPITAL_BASED_OUTPATIENT_CLINIC_OR_DEPARTMENT_OTHER): Payer: Self-pay

## 2025-02-18 ENCOUNTER — Ambulatory Visit (HOSPITAL_BASED_OUTPATIENT_CLINIC_OR_DEPARTMENT_OTHER): Admitting: Nurse Practitioner

## 2025-04-29 ENCOUNTER — Ambulatory Visit: Admitting: Neurology

## 2025-05-26 ENCOUNTER — Ambulatory Visit: Admitting: Nurse Practitioner

## 2025-06-29 ENCOUNTER — Ambulatory Visit: Admitting: Nurse Practitioner

## 2025-07-09 ENCOUNTER — Ambulatory Visit: Admitting: Nurse Practitioner
